# Patient Record
Sex: Female | Born: 1945 | Race: White | Hispanic: No | Marital: Married | Smoking: Never smoker
Health system: Southern US, Community
[De-identification: ages and names within clinical notes are randomized; demographics above are authoritative.]

## PROBLEM LIST (undated history)

## (undated) DIAGNOSIS — C801 Malignant (primary) neoplasm, unspecified: Secondary | ICD-10-CM

## (undated) DIAGNOSIS — J189 Pneumonia, unspecified organism: Secondary | ICD-10-CM

## (undated) DIAGNOSIS — R112 Nausea with vomiting, unspecified: Secondary | ICD-10-CM

## (undated) DIAGNOSIS — K449 Diaphragmatic hernia without obstruction or gangrene: Secondary | ICD-10-CM

## (undated) DIAGNOSIS — Z86010 Personal history of colonic polyps: Secondary | ICD-10-CM

## (undated) DIAGNOSIS — E785 Hyperlipidemia, unspecified: Secondary | ICD-10-CM

## (undated) DIAGNOSIS — E039 Hypothyroidism, unspecified: Secondary | ICD-10-CM

## (undated) DIAGNOSIS — I10 Essential (primary) hypertension: Secondary | ICD-10-CM

## (undated) DIAGNOSIS — T4145XA Adverse effect of unspecified anesthetic, initial encounter: Secondary | ICD-10-CM

## (undated) DIAGNOSIS — Z9889 Other specified postprocedural states: Secondary | ICD-10-CM

## (undated) DIAGNOSIS — T8859XA Other complications of anesthesia, initial encounter: Secondary | ICD-10-CM

## (undated) HISTORY — DX: Hyperlipidemia, unspecified: E78.5

## (undated) HISTORY — DX: Malignant (primary) neoplasm, unspecified: C80.1

## (undated) HISTORY — PX: ABDOMINAL HYSTERECTOMY: SHX81

## (undated) HISTORY — DX: Hypothyroidism, unspecified: E03.9

## (undated) HISTORY — DX: Personal history of colonic polyps: Z86.010

## (undated) HISTORY — DX: Essential (primary) hypertension: I10

## (undated) HISTORY — PX: OTHER SURGICAL HISTORY: SHX169

## (undated) HISTORY — DX: Pneumonia, unspecified organism: J18.9

## (undated) HISTORY — DX: Diaphragmatic hernia without obstruction or gangrene: K44.9

---

## 1998-07-02 ENCOUNTER — Ambulatory Visit (HOSPITAL_BASED_OUTPATIENT_CLINIC_OR_DEPARTMENT_OTHER): Admission: RE | Admit: 1998-07-02 | Discharge: 1998-07-02 | Payer: Self-pay | Admitting: *Deleted

## 1999-06-10 ENCOUNTER — Encounter: Payer: Self-pay | Admitting: Family Medicine

## 1999-06-10 ENCOUNTER — Encounter: Admission: RE | Admit: 1999-06-10 | Discharge: 1999-06-10 | Payer: Self-pay | Admitting: Family Medicine

## 1999-10-27 ENCOUNTER — Other Ambulatory Visit: Admission: RE | Admit: 1999-10-27 | Discharge: 1999-10-27 | Payer: Self-pay | Admitting: Obstetrics and Gynecology

## 2000-10-27 ENCOUNTER — Other Ambulatory Visit: Admission: RE | Admit: 2000-10-27 | Discharge: 2000-10-27 | Payer: Self-pay | Admitting: Obstetrics and Gynecology

## 2001-11-01 ENCOUNTER — Encounter: Payer: Self-pay | Admitting: Family Medicine

## 2001-11-01 ENCOUNTER — Encounter: Admission: RE | Admit: 2001-11-01 | Discharge: 2001-11-01 | Payer: Self-pay | Admitting: Family Medicine

## 2001-12-05 ENCOUNTER — Other Ambulatory Visit: Admission: RE | Admit: 2001-12-05 | Discharge: 2001-12-05 | Payer: Self-pay | Admitting: Obstetrics and Gynecology

## 2004-12-28 ENCOUNTER — Encounter: Admission: RE | Admit: 2004-12-28 | Discharge: 2004-12-28 | Payer: Self-pay | Admitting: Family Medicine

## 2006-03-16 ENCOUNTER — Encounter: Admission: RE | Admit: 2006-03-16 | Discharge: 2006-03-16 | Payer: Self-pay | Admitting: Family Medicine

## 2006-03-24 ENCOUNTER — Encounter: Admission: RE | Admit: 2006-03-24 | Discharge: 2006-03-24 | Payer: Self-pay | Admitting: Family Medicine

## 2006-06-20 LAB — HM MAMMOGRAPHY: HM Mammogram: NORMAL

## 2007-03-15 ENCOUNTER — Ambulatory Visit (HOSPITAL_BASED_OUTPATIENT_CLINIC_OR_DEPARTMENT_OTHER): Admission: RE | Admit: 2007-03-15 | Discharge: 2007-03-15 | Payer: Self-pay | Admitting: Otolaryngology

## 2007-03-18 ENCOUNTER — Ambulatory Visit: Payer: Self-pay | Admitting: Internal Medicine

## 2007-06-25 ENCOUNTER — Encounter: Admission: RE | Admit: 2007-06-25 | Discharge: 2007-06-25 | Payer: Self-pay | Admitting: Cardiology

## 2008-05-05 ENCOUNTER — Ambulatory Visit: Payer: Self-pay | Admitting: Oncology

## 2008-05-08 LAB — CBC WITH DIFFERENTIAL/PLATELET
Basophils Absolute: 0 10*3/uL (ref 0.0–0.1)
EOS%: 1.3 % (ref 0.0–7.0)
Eosinophils Absolute: 0.1 10*3/uL (ref 0.0–0.5)
HGB: 14.9 g/dL (ref 11.6–15.9)
NEUT#: 4 10*3/uL (ref 1.5–6.5)
RDW: 12.9 % (ref 11.3–14.5)
lymph#: 1.9 10*3/uL (ref 0.9–3.3)

## 2008-05-12 ENCOUNTER — Ambulatory Visit (HOSPITAL_COMMUNITY): Admission: RE | Admit: 2008-05-12 | Discharge: 2008-05-12 | Payer: Self-pay | Admitting: Oncology

## 2008-05-12 LAB — SPEP & IFE WITH QIG
Alpha-1-Globulin: 4 % (ref 2.9–4.9)
Alpha-2-Globulin: 9.5 % (ref 7.1–11.8)
Beta Globulin: 6 % (ref 4.7–7.2)
IgG (Immunoglobin G), Serum: 866 mg/dL (ref 694–1618)
Total Protein, Serum Electrophoresis: 7.2 g/dL (ref 6.0–8.3)

## 2008-05-12 LAB — COMPREHENSIVE METABOLIC PANEL
AST: 20 U/L (ref 0–37)
Albumin: 4.6 g/dL (ref 3.5–5.2)
BUN: 16 mg/dL (ref 6–23)
Calcium: 9.6 mg/dL (ref 8.4–10.5)
Chloride: 104 mEq/L (ref 96–112)
Glucose, Bld: 89 mg/dL (ref 70–99)
Potassium: 3.9 mEq/L (ref 3.5–5.3)
Sodium: 142 mEq/L (ref 135–145)
Total Protein: 7.2 g/dL (ref 6.0–8.3)

## 2008-05-26 ENCOUNTER — Encounter: Payer: Self-pay | Admitting: Oncology

## 2008-05-26 ENCOUNTER — Other Ambulatory Visit: Admission: RE | Admit: 2008-05-26 | Discharge: 2008-05-26 | Payer: Self-pay | Admitting: Oncology

## 2008-05-27 ENCOUNTER — Ambulatory Visit (HOSPITAL_COMMUNITY): Admission: RE | Admit: 2008-05-27 | Discharge: 2008-05-27 | Payer: Self-pay | Admitting: Oncology

## 2008-05-27 LAB — HEPATITIS B SURFACE ANTIGEN: Hepatitis B Surface Ag: NEGATIVE

## 2008-06-10 LAB — CBC WITH DIFFERENTIAL/PLATELET
BASO%: 1.8 % (ref 0.0–2.0)
HCT: 38.9 % (ref 34.8–46.6)
LYMPH%: 55.1 % — ABNORMAL HIGH (ref 14.0–48.0)
MCH: 32.3 pg (ref 26.0–34.0)
MCHC: 35.3 g/dL (ref 32.0–36.0)
MCV: 91.7 fL (ref 81.0–101.0)
MONO#: 0.3 10*3/uL (ref 0.1–0.9)
MONO%: 21.9 % — ABNORMAL HIGH (ref 0.0–13.0)
NEUT%: 18.1 % — ABNORMAL LOW (ref 39.6–76.8)
Platelets: 162 10*3/uL (ref 145–400)

## 2008-06-10 LAB — COMPREHENSIVE METABOLIC PANEL
ALT: 17 U/L (ref 0–35)
Alkaline Phosphatase: 51 U/L (ref 39–117)
CO2: 29 mEq/L (ref 19–32)
Creatinine, Ser: 0.7 mg/dL (ref 0.40–1.20)
Total Bilirubin: 0.6 mg/dL (ref 0.3–1.2)

## 2008-06-10 LAB — LACTATE DEHYDROGENASE: LDH: 157 U/L (ref 94–250)

## 2008-06-12 LAB — CBC WITH DIFFERENTIAL/PLATELET
Basophils Absolute: 0 10*3/uL (ref 0.0–0.1)
HCT: 37.6 % (ref 34.8–46.6)
HGB: 13.1 g/dL (ref 11.6–15.9)
MCH: 31.9 pg (ref 26.0–34.0)
MONO#: 0.4 10*3/uL (ref 0.1–0.9)
NEUT%: 3.3 % — ABNORMAL LOW (ref 39.6–76.8)
WBC: 1.1 10*3/uL — ABNORMAL LOW (ref 3.9–10.0)
lymph#: 0.6 10*3/uL — ABNORMAL LOW (ref 0.9–3.3)

## 2008-06-16 ENCOUNTER — Ambulatory Visit: Payer: Self-pay | Admitting: Oncology

## 2008-06-16 LAB — CBC WITH DIFFERENTIAL/PLATELET
Basophils Absolute: 0 10*3/uL (ref 0.0–0.1)
EOS%: 0.6 % (ref 0.0–7.0)
HCT: 38.6 % (ref 34.8–46.6)
HGB: 13.7 g/dL (ref 11.6–15.9)
MCH: 32.3 pg (ref 26.0–34.0)
MCV: 91 fL (ref 81.0–101.0)
MONO%: 36.7 % — ABNORMAL HIGH (ref 0.0–13.0)
NEUT%: 19.4 % — ABNORMAL LOW (ref 39.6–76.8)

## 2008-06-23 LAB — CBC WITH DIFFERENTIAL/PLATELET
BASO%: 0.3 % (ref 0.0–2.0)
Basophils Absolute: 0 10*3/uL (ref 0.0–0.1)
EOS%: 0.9 % (ref 0.0–7.0)
HCT: 40.2 % (ref 34.8–46.6)
HGB: 14 g/dL (ref 11.6–15.9)
MCH: 31.8 pg (ref 26.0–34.0)
MCHC: 34.9 g/dL (ref 32.0–36.0)
MCV: 91.1 fL (ref 81.0–101.0)
MONO%: 7.9 % (ref 0.0–13.0)
NEUT%: 59.8 % (ref 39.6–76.8)
lymph#: 1.4 10*3/uL (ref 0.9–3.3)

## 2008-06-27 LAB — CBC WITH DIFFERENTIAL/PLATELET
BASO%: 0 % (ref 0.0–2.0)
EOS%: 0 % (ref 0.0–7.0)
MCH: 32.4 pg (ref 26.0–34.0)
MCHC: 35 g/dL (ref 32.0–36.0)
MCV: 92.6 fL (ref 81.0–101.0)
MONO%: 0.2 % (ref 0.0–13.0)
RBC: 4.05 10*6/uL (ref 3.70–5.32)
RDW: 13 % (ref 11.3–14.5)
lymph#: 0.2 10*3/uL — ABNORMAL LOW (ref 0.9–3.3)

## 2008-07-08 ENCOUNTER — Ambulatory Visit (HOSPITAL_COMMUNITY): Admission: RE | Admit: 2008-07-08 | Discharge: 2008-07-08 | Payer: Self-pay | Admitting: Oncology

## 2008-07-16 LAB — CBC WITH DIFFERENTIAL/PLATELET
Eosinophils Absolute: 0 10*3/uL (ref 0.0–0.5)
HGB: 12.7 g/dL (ref 11.6–15.9)
MCV: 93.1 fL (ref 81.0–101.0)
MONO#: 0.5 10*3/uL (ref 0.1–0.9)
MONO%: 13.3 % — ABNORMAL HIGH (ref 0.0–13.0)
NEUT#: 2.8 10*3/uL (ref 1.5–6.5)
RBC: 3.88 10*6/uL (ref 3.70–5.32)
RDW: 14.6 % — ABNORMAL HIGH (ref 11.3–14.5)
WBC: 4 10*3/uL (ref 3.9–10.0)
lymph#: 0.7 10*3/uL — ABNORMAL LOW (ref 0.9–3.3)

## 2008-08-01 ENCOUNTER — Ambulatory Visit: Payer: Self-pay | Admitting: Oncology

## 2008-08-05 LAB — CBC WITH DIFFERENTIAL/PLATELET
Basophils Absolute: 0 10*3/uL (ref 0.0–0.1)
Eosinophils Absolute: 0 10*3/uL (ref 0.0–0.5)
HCT: 33.9 % — ABNORMAL LOW (ref 34.8–46.6)
HGB: 11.8 g/dL (ref 11.6–15.9)
MCV: 95.7 fL (ref 81.0–101.0)
NEUT#: 3.6 10*3/uL (ref 1.5–6.5)
NEUT%: 68.2 % (ref 39.6–76.8)
RDW: 19.3 % — ABNORMAL HIGH (ref 11.3–14.5)
lymph#: 0.6 10*3/uL — ABNORMAL LOW (ref 0.9–3.3)

## 2008-08-05 LAB — COMPREHENSIVE METABOLIC PANEL
Albumin: 4.2 g/dL (ref 3.5–5.2)
BUN: 13 mg/dL (ref 6–23)
Calcium: 9.5 mg/dL (ref 8.4–10.5)
Chloride: 105 mEq/L (ref 96–112)
Glucose, Bld: 110 mg/dL — ABNORMAL HIGH (ref 70–99)
Potassium: 3.7 mEq/L (ref 3.5–5.3)

## 2008-08-05 LAB — LACTATE DEHYDROGENASE: LDH: 255 U/L — ABNORMAL HIGH (ref 94–250)

## 2008-08-26 LAB — CBC WITH DIFFERENTIAL/PLATELET
Basophils Absolute: 0 10*3/uL (ref 0.0–0.1)
EOS%: 0.1 % (ref 0.0–7.0)
Eosinophils Absolute: 0 10*3/uL (ref 0.0–0.5)
HGB: 10.5 g/dL — ABNORMAL LOW (ref 11.6–15.9)
LYMPH%: 12 % — ABNORMAL LOW (ref 14.0–49.7)
MCH: 34.5 pg — ABNORMAL HIGH (ref 25.1–34.0)
MCV: 98 fL (ref 79.5–101.0)
MONO%: 17.6 % — ABNORMAL HIGH (ref 0.0–14.0)
NEUT#: 3.6 10*3/uL (ref 1.5–6.5)
Platelets: 337 10*3/uL (ref 145–400)
RDW: 18.9 % — ABNORMAL HIGH (ref 11.2–14.5)

## 2008-08-26 LAB — COMPREHENSIVE METABOLIC PANEL
AST: 14 U/L (ref 0–37)
Alkaline Phosphatase: 81 U/L (ref 39–117)
BUN: 12 mg/dL (ref 6–23)
Creatinine, Ser: 0.76 mg/dL (ref 0.40–1.20)
Glucose, Bld: 128 mg/dL — ABNORMAL HIGH (ref 70–99)
Potassium: 3.7 mEq/L (ref 3.5–5.3)
Total Bilirubin: 0.7 mg/dL (ref 0.3–1.2)

## 2008-09-09 ENCOUNTER — Ambulatory Visit (HOSPITAL_COMMUNITY): Admission: RE | Admit: 2008-09-09 | Discharge: 2008-09-09 | Payer: Self-pay | Admitting: Oncology

## 2008-09-12 ENCOUNTER — Ambulatory Visit: Payer: Self-pay | Admitting: Oncology

## 2008-09-16 LAB — CBC WITH DIFFERENTIAL/PLATELET
Basophils Absolute: 0 10*3/uL (ref 0.0–0.1)
EOS%: 0.1 % (ref 0.0–7.0)
Eosinophils Absolute: 0 10*3/uL (ref 0.0–0.5)
HCT: 31.1 % — ABNORMAL LOW (ref 34.8–46.6)
HGB: 10.7 g/dL — ABNORMAL LOW (ref 11.6–15.9)
MCH: 34.7 pg — ABNORMAL HIGH (ref 25.1–34.0)
MCV: 100.3 fL (ref 79.5–101.0)
MONO%: 12.7 % (ref 0.0–14.0)
NEUT#: 5.3 10*3/uL (ref 1.5–6.5)
NEUT%: 80.1 % — ABNORMAL HIGH (ref 38.4–76.8)
Platelets: 289 10*3/uL (ref 145–400)
RDW: 18.6 % — ABNORMAL HIGH (ref 11.2–14.5)

## 2008-09-16 LAB — COMPREHENSIVE METABOLIC PANEL
Albumin: 4.3 g/dL (ref 3.5–5.2)
Alkaline Phosphatase: 70 U/L (ref 39–117)
BUN: 14 mg/dL (ref 6–23)
Calcium: 9.7 mg/dL (ref 8.4–10.5)
Creatinine, Ser: 0.71 mg/dL (ref 0.40–1.20)
Glucose, Bld: 124 mg/dL — ABNORMAL HIGH (ref 70–99)
Potassium: 3.8 mEq/L (ref 3.5–5.3)

## 2008-10-14 LAB — CBC WITH DIFFERENTIAL/PLATELET
Basophils Absolute: 0 10*3/uL (ref 0.0–0.1)
Eosinophils Absolute: 0 10*3/uL (ref 0.0–0.5)
HCT: 34.9 % (ref 34.8–46.6)
HGB: 11.9 g/dL (ref 11.6–15.9)
MCH: 33.9 pg (ref 25.1–34.0)
MCV: 99.4 fL (ref 79.5–101.0)
MONO%: 34.6 % — ABNORMAL HIGH (ref 0.0–14.0)
NEUT#: 1.3 10*3/uL — ABNORMAL LOW (ref 1.5–6.5)
NEUT%: 45.3 % (ref 38.4–76.8)
RDW: 16.2 % — ABNORMAL HIGH (ref 11.2–14.5)

## 2008-11-21 ENCOUNTER — Ambulatory Visit: Payer: Self-pay | Admitting: Oncology

## 2008-11-25 LAB — COMPREHENSIVE METABOLIC PANEL
ALT: 26 U/L (ref 0–35)
AST: 25 U/L (ref 0–37)
Alkaline Phosphatase: 74 U/L (ref 39–117)
BUN: 16 mg/dL (ref 6–23)
Calcium: 10.2 mg/dL (ref 8.4–10.5)
Chloride: 105 mEq/L (ref 96–112)
Creatinine, Ser: 0.73 mg/dL (ref 0.40–1.20)
Total Bilirubin: 0.9 mg/dL (ref 0.3–1.2)

## 2008-11-25 LAB — CBC WITH DIFFERENTIAL/PLATELET
BASO%: 0.3 % (ref 0.0–2.0)
Basophils Absolute: 0 10*3/uL (ref 0.0–0.1)
EOS%: 4.8 % (ref 0.0–7.0)
HCT: 40.3 % (ref 34.8–46.6)
HGB: 14.2 g/dL (ref 11.6–15.9)
LYMPH%: 28.4 % (ref 14.0–49.7)
MCH: 33.6 pg (ref 25.1–34.0)
MCHC: 35.2 g/dL (ref 31.5–36.0)
MCV: 95.3 fL (ref 79.5–101.0)
MONO%: 13 % (ref 0.0–14.0)
NEUT%: 53.5 % (ref 38.4–76.8)
Platelets: 170 10*3/uL (ref 145–400)

## 2009-02-12 ENCOUNTER — Ambulatory Visit: Payer: Self-pay | Admitting: Oncology

## 2009-02-16 LAB — CBC WITH DIFFERENTIAL/PLATELET
BASO%: 0.3 % (ref 0.0–2.0)
Basophils Absolute: 0 10*3/uL (ref 0.0–0.1)
EOS%: 5.8 % (ref 0.0–7.0)
LYMPH%: 27.7 % (ref 14.0–49.7)
MCV: 91.5 fL (ref 79.5–101.0)
NEUT#: 1.4 10*3/uL — ABNORMAL LOW (ref 1.5–6.5)
NEUT%: 46.7 % (ref 38.4–76.8)
RBC: 4.45 10*6/uL (ref 3.70–5.45)
RDW: 12.5 % (ref 11.2–14.5)
WBC: 2.9 10*3/uL — ABNORMAL LOW (ref 3.9–10.3)
lymph#: 0.8 10*3/uL — ABNORMAL LOW (ref 0.9–3.3)

## 2009-02-16 LAB — COMPREHENSIVE METABOLIC PANEL
ALT: 30 U/L (ref 0–35)
BUN: 17 mg/dL (ref 6–23)
CO2: 27 mEq/L (ref 19–32)
Calcium: 10 mg/dL (ref 8.4–10.5)
Creatinine, Ser: 0.7 mg/dL (ref 0.40–1.20)
Total Bilirubin: 1.1 mg/dL (ref 0.3–1.2)

## 2009-02-16 LAB — LACTATE DEHYDROGENASE: LDH: 246 U/L (ref 94–250)

## 2009-04-06 ENCOUNTER — Emergency Department (HOSPITAL_COMMUNITY): Admission: EM | Admit: 2009-04-06 | Discharge: 2009-04-07 | Payer: Self-pay | Admitting: Emergency Medicine

## 2009-04-13 ENCOUNTER — Ambulatory Visit: Payer: Self-pay | Admitting: Oncology

## 2009-05-08 ENCOUNTER — Ambulatory Visit (HOSPITAL_COMMUNITY): Admission: RE | Admit: 2009-05-08 | Discharge: 2009-05-08 | Payer: Self-pay | Admitting: Oncology

## 2009-05-08 LAB — CBC WITH DIFFERENTIAL/PLATELET
BASO%: 0.3 % (ref 0.0–2.0)
EOS%: 9.5 % — ABNORMAL HIGH (ref 0.0–7.0)
MCH: 33.2 pg (ref 25.1–34.0)
MCHC: 34.6 g/dL (ref 31.5–36.0)
MCV: 96 fL (ref 79.5–101.0)
MONO%: 9.9 % (ref 0.0–14.0)
RBC: 4.34 10*6/uL (ref 3.70–5.45)
RDW: 12.9 % (ref 11.2–14.5)
lymph#: 0.9 10*3/uL (ref 0.9–3.3)

## 2009-05-13 ENCOUNTER — Ambulatory Visit: Payer: Self-pay | Admitting: Oncology

## 2009-05-20 ENCOUNTER — Ambulatory Visit (HOSPITAL_COMMUNITY): Admission: RE | Admit: 2009-05-20 | Discharge: 2009-05-20 | Payer: Self-pay | Admitting: Oncology

## 2009-06-09 ENCOUNTER — Ambulatory Visit (HOSPITAL_COMMUNITY): Admission: RE | Admit: 2009-06-09 | Discharge: 2009-06-09 | Payer: Self-pay | Admitting: Oncology

## 2009-06-10 ENCOUNTER — Ambulatory Visit: Payer: Self-pay | Admitting: Oncology

## 2009-06-16 LAB — COMPREHENSIVE METABOLIC PANEL
ALT: 29 U/L (ref 0–35)
AST: 28 U/L (ref 0–37)
Albumin: 4.3 g/dL (ref 3.5–5.2)
CO2: 31 mEq/L (ref 19–32)
Calcium: 9.6 mg/dL (ref 8.4–10.5)
Chloride: 106 mEq/L (ref 96–112)
Potassium: 3.5 mEq/L (ref 3.5–5.3)

## 2009-06-16 LAB — CBC WITH DIFFERENTIAL/PLATELET
BASO%: 0.1 % (ref 0.0–2.0)
Basophils Absolute: 0 10*3/uL (ref 0.0–0.1)
EOS%: 6.5 % (ref 0.0–7.0)
HCT: 42.9 % (ref 34.8–46.6)
HGB: 14.8 g/dL (ref 11.6–15.9)
MCH: 33.1 pg (ref 25.1–34.0)
MCHC: 34.5 g/dL (ref 31.5–36.0)
MONO#: 0.5 10*3/uL (ref 0.1–0.9)
NEUT%: 59.9 % (ref 38.4–76.8)
RDW: 12.5 % (ref 11.2–14.5)
WBC: 5 10*3/uL (ref 3.9–10.3)
lymph#: 1.1 10*3/uL (ref 0.9–3.3)

## 2009-06-16 LAB — LACTATE DEHYDROGENASE: LDH: 213 U/L (ref 94–250)

## 2009-07-01 LAB — CBC WITH DIFFERENTIAL/PLATELET
Basophils Absolute: 0 10*3/uL (ref 0.0–0.1)
MCHC: 34.4 g/dL (ref 31.5–36.0)
MCV: 95.8 fL (ref 79.5–101.0)
NEUT#: 3.8 10*3/uL (ref 1.5–6.5)
NEUT%: 65.3 % (ref 38.4–76.8)
lymph#: 0.9 10*3/uL (ref 0.9–3.3)

## 2009-07-10 ENCOUNTER — Ambulatory Visit: Payer: Self-pay | Admitting: Oncology

## 2009-07-10 LAB — CBC WITH DIFFERENTIAL/PLATELET
Basophils Absolute: 0 10*3/uL (ref 0.0–0.1)
EOS%: 6.4 % (ref 0.0–7.0)
Eosinophils Absolute: 0.3 10*3/uL (ref 0.0–0.5)
HGB: 14.5 g/dL (ref 11.6–15.9)
LYMPH%: 13.5 % — ABNORMAL LOW (ref 14.0–49.7)
MCHC: 35 g/dL (ref 31.5–36.0)
MONO%: 12.5 % (ref 0.0–14.0)
NEUT#: 3.3 10*3/uL (ref 1.5–6.5)
Platelets: 166 10*3/uL (ref 145–400)
RDW: 12.4 % (ref 11.2–14.5)
WBC: 4.9 10*3/uL (ref 3.9–10.3)

## 2009-07-10 LAB — COMPREHENSIVE METABOLIC PANEL
AST: 26 U/L (ref 0–37)
BUN: 18 mg/dL (ref 6–23)
CO2: 30 mEq/L (ref 19–32)
Calcium: 9.4 mg/dL (ref 8.4–10.5)
Chloride: 106 mEq/L (ref 96–112)
Creatinine, Ser: 0.76 mg/dL (ref 0.40–1.20)
Potassium: 3.4 mEq/L — ABNORMAL LOW (ref 3.5–5.3)
Sodium: 143 mEq/L (ref 135–145)
Total Bilirubin: 0.8 mg/dL (ref 0.3–1.2)
Total Protein: 6.9 g/dL (ref 6.0–8.3)

## 2009-08-05 LAB — CBC WITH DIFFERENTIAL/PLATELET
BASO%: 0.5 % (ref 0.0–2.0)
Basophils Absolute: 0 10*3/uL (ref 0.0–0.1)
EOS%: 8.7 % — ABNORMAL HIGH (ref 0.0–7.0)
HGB: 13.5 g/dL (ref 11.6–15.9)
MCH: 32.4 pg (ref 25.1–34.0)
MCHC: 34.3 g/dL (ref 31.5–36.0)
MCV: 94.5 fL (ref 79.5–101.0)
MONO%: 14.2 % — ABNORMAL HIGH (ref 0.0–14.0)
RDW: 13.3 % (ref 11.2–14.5)

## 2009-08-05 LAB — COMPREHENSIVE METABOLIC PANEL
Alkaline Phosphatase: 68 U/L (ref 39–117)
BUN: 18 mg/dL (ref 6–23)
Glucose, Bld: 90 mg/dL (ref 70–99)
Sodium: 143 mEq/L (ref 135–145)
Total Bilirubin: 0.7 mg/dL (ref 0.3–1.2)
Total Protein: 6.4 g/dL (ref 6.0–8.3)

## 2009-08-12 ENCOUNTER — Ambulatory Visit: Payer: Self-pay | Admitting: Oncology

## 2009-09-08 ENCOUNTER — Ambulatory Visit (HOSPITAL_COMMUNITY): Admission: RE | Admit: 2009-09-08 | Discharge: 2009-09-08 | Payer: Self-pay | Admitting: Oncology

## 2009-09-08 LAB — CBC WITH DIFFERENTIAL/PLATELET
Basophils Absolute: 0 10*3/uL (ref 0.0–0.1)
EOS%: 4.5 % (ref 0.0–7.0)
HCT: 37.8 % (ref 34.8–46.6)
HGB: 13.1 g/dL (ref 11.6–15.9)
LYMPH%: 18.9 % (ref 14.0–49.7)
MCH: 32.9 pg (ref 25.1–34.0)
MCHC: 34.7 g/dL (ref 31.5–36.0)
MCV: 95 fL (ref 79.5–101.0)
MONO%: 15.4 % — ABNORMAL HIGH (ref 0.0–14.0)
NEUT%: 60.7 % (ref 38.4–76.8)
Platelets: 159 10*3/uL (ref 145–400)

## 2009-09-18 ENCOUNTER — Ambulatory Visit: Payer: Self-pay | Admitting: Oncology

## 2009-09-23 LAB — CBC WITH DIFFERENTIAL/PLATELET
Basophils Absolute: 0 10*3/uL (ref 0.0–0.1)
Eosinophils Absolute: 0.1 10*3/uL (ref 0.0–0.5)
HGB: 12.3 g/dL (ref 11.6–15.9)
MCV: 96.9 fL (ref 79.5–101.0)
MONO#: 0.1 10*3/uL (ref 0.1–0.9)
MONO%: 0.9 % (ref 0.0–14.0)
NEUT#: 5.5 10*3/uL (ref 1.5–6.5)
RDW: 13.2 % (ref 11.2–14.5)
lymph#: 0.1 10*3/uL — ABNORMAL LOW (ref 0.9–3.3)

## 2009-09-23 LAB — COMPREHENSIVE METABOLIC PANEL
Albumin: 4.2 g/dL (ref 3.5–5.2)
BUN: 17 mg/dL (ref 6–23)
CO2: 22 mEq/L (ref 19–32)
Calcium: 9 mg/dL (ref 8.4–10.5)
Chloride: 103 mEq/L (ref 96–112)
Glucose, Bld: 147 mg/dL — ABNORMAL HIGH (ref 70–99)
Potassium: 3.2 mEq/L — ABNORMAL LOW (ref 3.5–5.3)

## 2009-09-25 ENCOUNTER — Encounter (HOSPITAL_COMMUNITY): Admission: RE | Admit: 2009-09-25 | Discharge: 2009-12-24 | Payer: Self-pay | Admitting: Oncology

## 2009-09-25 LAB — CBC WITH DIFFERENTIAL/PLATELET
HCT: 28.8 % — ABNORMAL LOW (ref 34.8–46.6)
HGB: 10.2 g/dL — ABNORMAL LOW (ref 11.6–15.9)
MCH: 32.8 pg (ref 25.1–34.0)
MCHC: 35.4 g/dL (ref 31.5–36.0)
MCV: 92.6 fL (ref 79.5–101.0)
Platelets: 29 10*3/uL — ABNORMAL LOW (ref 145–400)
RBC: 3.11 10*6/uL — ABNORMAL LOW (ref 3.70–5.45)
RDW: 12.5 % (ref 11.2–14.5)
WBC: <0.2 10*3/uL — CL (ref 3.9–10.3)

## 2009-09-25 LAB — COMPREHENSIVE METABOLIC PANEL
AST: 19 U/L (ref 0–37)
Albumin: 4 g/dL (ref 3.5–5.2)
Alkaline Phosphatase: 72 U/L (ref 39–117)
Potassium: 3.5 mEq/L (ref 3.5–5.3)
Sodium: 140 mEq/L (ref 135–145)
Total Protein: 5.9 g/dL — ABNORMAL LOW (ref 6.0–8.3)

## 2009-09-25 LAB — TECHNOLOGIST REVIEW

## 2009-09-28 LAB — COMPREHENSIVE METABOLIC PANEL
Albumin: 3.6 g/dL (ref 3.5–5.2)
Alkaline Phosphatase: 79 U/L (ref 39–117)
BUN: 14 mg/dL (ref 6–23)
Glucose, Bld: 163 mg/dL — ABNORMAL HIGH (ref 70–99)
Total Bilirubin: 2.2 mg/dL — ABNORMAL HIGH (ref 0.3–1.2)

## 2009-09-28 LAB — CBC WITH DIFFERENTIAL/PLATELET
HCT: 22.6 % — ABNORMAL LOW (ref 34.8–46.6)
MCH: 32 pg (ref 25.1–34.0)
MCHC: 35.8 g/dL (ref 31.5–36.0)
MCV: 89.3 fL (ref 79.5–101.0)
Platelets: <6 10*3/uL — CL (ref 145–400)

## 2009-09-28 LAB — PLATELET COUNT: Platelets: 21 10*3/uL — ABNORMAL LOW (ref 145–400)

## 2009-09-29 LAB — CBC WITH DIFFERENTIAL/PLATELET
HCT: 25.7 % — ABNORMAL LOW (ref 34.8–46.6)
Platelets: 15 10*3/uL — ABNORMAL LOW (ref 145–400)
RBC: 2.98 10*6/uL — ABNORMAL LOW (ref 3.70–5.45)
WBC: <0.2 10*3/uL — CL (ref 3.9–10.3)

## 2009-09-30 LAB — CBC WITH DIFFERENTIAL/PLATELET
Basophils Absolute: 0 10*3/uL (ref 0.0–0.1)
Eosinophils Absolute: 0 10*3/uL (ref 0.0–0.5)
HCT: 25.7 % — ABNORMAL LOW (ref 34.8–46.6)
LYMPH%: 10.7 % — ABNORMAL LOW (ref 14.0–49.7)
MONO#: 0.1 10*3/uL (ref 0.1–0.9)
NEUT#: 0.6 10*3/uL — ABNORMAL LOW (ref 1.5–6.5)
NEUT%: 71.4 % (ref 38.4–76.8)
Platelets: 12 10*3/uL — ABNORMAL LOW (ref 145–400)
WBC: 0.8 10*3/uL — CL (ref 3.9–10.3)

## 2009-09-30 LAB — COMPREHENSIVE METABOLIC PANEL
ALT: 22 U/L (ref 0–35)
BUN: 14 mg/dL (ref 6–23)
CO2: 25 mEq/L (ref 19–32)
Calcium: 8.9 mg/dL (ref 8.4–10.5)
Chloride: 103 mEq/L (ref 96–112)
Creatinine, Ser: 0.57 mg/dL (ref 0.40–1.20)
Glucose, Bld: 112 mg/dL — ABNORMAL HIGH (ref 70–99)

## 2009-10-01 LAB — CBC WITH DIFFERENTIAL/PLATELET
BASO%: 0.3 % (ref 0.0–2.0)
HCT: 25.4 % — ABNORMAL LOW (ref 34.8–46.6)
HGB: 9 g/dL — ABNORMAL LOW (ref 11.6–15.9)
MCHC: 35.4 g/dL (ref 31.5–36.0)
MONO#: 0.4 10*3/uL (ref 0.1–0.9)
NEUT%: 78.6 % — ABNORMAL HIGH (ref 38.4–76.8)
WBC: 3.2 10*3/uL — ABNORMAL LOW (ref 3.9–10.3)
lymph#: 0.3 10*3/uL — ABNORMAL LOW (ref 0.9–3.3)

## 2009-10-14 LAB — CBC WITH DIFFERENTIAL/PLATELET
BASO%: 2.7 % — ABNORMAL HIGH (ref 0.0–2.0)
Basophils Absolute: 0.1 10*3/uL (ref 0.0–0.1)
EOS%: 1.2 % (ref 0.0–7.0)
MCH: 30.5 pg (ref 25.1–34.0)
MCHC: 33.2 g/dL (ref 31.5–36.0)
MONO%: 19 % — ABNORMAL HIGH (ref 0.0–14.0)
NEUT#: 1.7 10*3/uL (ref 1.5–6.5)
NEUT%: 52.3 % (ref 38.4–76.8)
Platelets: 181 10*3/uL (ref 145–400)
WBC: 3.3 10*3/uL — ABNORMAL LOW (ref 3.9–10.3)

## 2009-11-11 ENCOUNTER — Ambulatory Visit: Payer: Self-pay | Admitting: Oncology

## 2009-11-12 LAB — CBC WITH DIFFERENTIAL/PLATELET
Basophils Absolute: 0 10*3/uL (ref 0.0–0.1)
EOS%: 0 % (ref 0.0–7.0)
MCH: 29.9 pg (ref 25.1–34.0)
MCHC: 33.7 g/dL (ref 31.5–36.0)
MCV: 88.7 fL (ref 79.5–101.0)
MONO%: 24.4 % — ABNORMAL HIGH (ref 0.0–14.0)
RBC: 3.35 10*6/uL — ABNORMAL LOW (ref 3.70–5.45)
RDW: 16.1 % — ABNORMAL HIGH (ref 11.2–14.5)
nRBC: 0 % (ref 0–0)

## 2009-11-12 LAB — COMPREHENSIVE METABOLIC PANEL
ALT: 18 U/L (ref 0–35)
CO2: 25 mEq/L (ref 19–32)
Creatinine, Ser: 1.33 mg/dL — ABNORMAL HIGH (ref 0.40–1.20)
Total Bilirubin: 0.7 mg/dL (ref 0.3–1.2)

## 2009-11-12 LAB — TECHNOLOGIST REVIEW

## 2009-11-12 LAB — PHOSPHORUS: Phosphorus: 3.8 mg/dL (ref 2.3–4.6)

## 2009-11-19 LAB — COMPREHENSIVE METABOLIC PANEL
ALT: 20 U/L (ref 0–35)
AST: 30 U/L (ref 0–37)
Albumin: 3.9 g/dL (ref 3.5–5.2)
Alkaline Phosphatase: 78 U/L (ref 39–117)
Potassium: 4.5 mEq/L (ref 3.5–5.3)
Sodium: 137 mEq/L (ref 135–145)
Total Protein: 6.2 g/dL (ref 6.0–8.3)

## 2009-11-19 LAB — CBC WITH DIFFERENTIAL/PLATELET
BASO%: 0.4 % (ref 0.0–2.0)
Basophils Absolute: 0 10*3/uL (ref 0.0–0.1)
EOS%: 1.1 % (ref 0.0–7.0)
HGB: 9.4 g/dL — ABNORMAL LOW (ref 11.6–15.9)
MCH: 30.4 pg (ref 25.1–34.0)
MCHC: 34.1 g/dL (ref 31.5–36.0)
RBC: 3.09 10*6/uL — ABNORMAL LOW (ref 3.70–5.45)
RDW: 18.1 % — ABNORMAL HIGH (ref 11.2–14.5)
lymph#: 1.7 10*3/uL (ref 0.9–3.3)
nRBC: 0 % (ref 0–0)

## 2009-11-26 LAB — COMPREHENSIVE METABOLIC PANEL
AST: 45 U/L — ABNORMAL HIGH (ref 0–37)
Alkaline Phosphatase: 93 U/L (ref 39–117)
BUN: 12 mg/dL (ref 6–23)
Creatinine, Ser: 0.99 mg/dL (ref 0.40–1.20)
Total Bilirubin: 0.9 mg/dL (ref 0.3–1.2)

## 2009-11-26 LAB — CBC WITH DIFFERENTIAL/PLATELET
Basophils Absolute: 0 10*3/uL (ref 0.0–0.1)
EOS%: 4.3 % (ref 0.0–7.0)
HCT: 23.8 % — ABNORMAL LOW (ref 34.8–46.6)
HGB: 8.5 g/dL — ABNORMAL LOW (ref 11.6–15.9)
LYMPH%: 14 % (ref 14.0–49.7)
MCH: 32.9 pg (ref 25.1–34.0)
MCV: 92.5 fL (ref 79.5–101.0)
MONO%: 12.5 % (ref 0.0–14.0)
NEUT%: 69 % (ref 38.4–76.8)
RDW: 23.9 % — ABNORMAL HIGH (ref 11.2–14.5)

## 2009-12-03 LAB — CBC WITH DIFFERENTIAL/PLATELET
Eosinophils Absolute: 0.4 10*3/uL (ref 0.0–0.5)
HCT: 22.2 % — ABNORMAL LOW (ref 34.8–46.6)
LYMPH%: 15.1 % (ref 14.0–49.7)
MONO#: 0.9 10*3/uL (ref 0.1–0.9)
NEUT#: 5.1 10*3/uL (ref 1.5–6.5)
NEUT%: 68.2 % (ref 38.4–76.8)
Platelets: 132 10*3/uL — ABNORMAL LOW (ref 145–400)
WBC: 7.5 10*3/uL (ref 3.9–10.3)
lymph#: 1.1 10*3/uL (ref 0.9–3.3)

## 2009-12-03 LAB — HOLD TUBE, BLOOD BANK

## 2009-12-04 LAB — PHOSPHORUS: Phosphorus: 3.5 mg/dL (ref 2.3–4.6)

## 2009-12-04 LAB — COMPREHENSIVE METABOLIC PANEL
ALT: 42 U/L — ABNORMAL HIGH (ref 0–35)
Alkaline Phosphatase: 128 U/L — ABNORMAL HIGH (ref 39–117)
BUN: 12 mg/dL (ref 6–23)
Chloride: 99 mEq/L (ref 96–112)
Potassium: 4.1 mEq/L (ref 3.5–5.3)
Sodium: 132 mEq/L — ABNORMAL LOW (ref 135–145)
Total Bilirubin: 0.8 mg/dL (ref 0.3–1.2)

## 2009-12-04 LAB — MAGNESIUM: Magnesium: 2 mg/dL (ref 1.5–2.5)

## 2009-12-11 ENCOUNTER — Ambulatory Visit: Payer: Self-pay | Admitting: Oncology

## 2009-12-11 ENCOUNTER — Ambulatory Visit: Payer: Self-pay | Admitting: Pulmonary Disease

## 2009-12-11 ENCOUNTER — Inpatient Hospital Stay (HOSPITAL_COMMUNITY): Admission: EM | Admit: 2009-12-11 | Discharge: 2009-12-15 | Payer: Self-pay | Admitting: Emergency Medicine

## 2009-12-11 ENCOUNTER — Encounter: Payer: Self-pay | Admitting: Cardiovascular Disease

## 2009-12-15 ENCOUNTER — Ambulatory Visit: Payer: Self-pay | Admitting: Oncology

## 2009-12-17 ENCOUNTER — Ambulatory Visit (HOSPITAL_COMMUNITY): Admission: RE | Admit: 2009-12-17 | Discharge: 2009-12-17 | Payer: Self-pay | Admitting: Oncology

## 2009-12-17 LAB — CBC WITH DIFFERENTIAL/PLATELET
BASO%: 0.3 % (ref 0.0–2.0)
Eosinophils Absolute: 0.1 10*3/uL (ref 0.0–0.5)
HCT: 26.2 % — ABNORMAL LOW (ref 34.8–46.6)
MCHC: 34.9 g/dL (ref 31.5–36.0)
MONO#: 0.8 10*3/uL (ref 0.1–0.9)
NEUT#: 5.5 10*3/uL (ref 1.5–6.5)
NEUT%: 65.4 % (ref 38.4–76.8)
RBC: 2.75 10*6/uL — ABNORMAL LOW (ref 3.70–5.45)
WBC: 8.4 10*3/uL (ref 3.9–10.3)
lymph#: 1.9 10*3/uL (ref 0.9–3.3)

## 2009-12-17 LAB — COMPREHENSIVE METABOLIC PANEL
ALT: 31 U/L (ref 0–35)
CO2: 27 mEq/L (ref 19–32)
Calcium: 9 mg/dL (ref 8.4–10.5)
Chloride: 101 mEq/L (ref 96–112)
Sodium: 135 mEq/L (ref 135–145)
Total Protein: 5.1 g/dL — ABNORMAL LOW (ref 6.0–8.3)

## 2009-12-17 LAB — PHOSPHORUS: Phosphorus: 3.3 mg/dL (ref 2.3–4.6)

## 2009-12-24 ENCOUNTER — Ambulatory Visit (HOSPITAL_COMMUNITY): Admission: RE | Admit: 2009-12-24 | Discharge: 2009-12-24 | Payer: Self-pay | Admitting: Oncology

## 2009-12-24 LAB — CBC WITH DIFFERENTIAL/PLATELET
Eosinophils Absolute: 0.2 10*3/uL (ref 0.0–0.5)
MONO#: 0.9 10*3/uL (ref 0.1–0.9)
NEUT#: 3.4 10*3/uL (ref 1.5–6.5)
RBC: 2.79 10*6/uL — ABNORMAL LOW (ref 3.70–5.45)
RDW: 25.3 % — ABNORMAL HIGH (ref 11.2–14.5)
WBC: 8 10*3/uL (ref 3.9–10.3)

## 2010-01-07 LAB — CBC WITH DIFFERENTIAL/PLATELET
Eosinophils Absolute: 0.1 10*3/uL (ref 0.0–0.5)
HCT: 29.2 % — ABNORMAL LOW (ref 34.8–46.6)
HGB: 10.3 g/dL — ABNORMAL LOW (ref 11.6–15.9)
LYMPH%: 53.4 % — ABNORMAL HIGH (ref 14.0–49.7)
MONO#: 0.8 10*3/uL (ref 0.1–0.9)
NEUT#: 2.5 10*3/uL (ref 1.5–6.5)
NEUT%: 33.5 % — ABNORMAL LOW (ref 38.4–76.8)
Platelets: 74 10*3/uL — ABNORMAL LOW (ref 145–400)
RBC: 2.8 10*6/uL — ABNORMAL LOW (ref 3.70–5.45)
WBC: 7.4 10*3/uL (ref 3.9–10.3)

## 2010-01-12 LAB — BASIC METABOLIC PANEL
CO2: 29 mEq/L (ref 19–32)
Calcium: 9.3 mg/dL (ref 8.4–10.5)
Glucose, Bld: 120 mg/dL — ABNORMAL HIGH (ref 70–99)
Sodium: 142 mEq/L (ref 135–145)

## 2010-01-15 LAB — BASIC METABOLIC PANEL
CO2: 29 mEq/L (ref 19–32)
Calcium: 9.4 mg/dL (ref 8.4–10.5)
Creatinine, Ser: 0.88 mg/dL (ref 0.40–1.20)

## 2010-01-18 ENCOUNTER — Ambulatory Visit: Payer: Self-pay | Admitting: Oncology

## 2010-02-11 ENCOUNTER — Encounter: Payer: Self-pay | Admitting: Cardiology

## 2010-03-17 ENCOUNTER — Ambulatory Visit: Payer: Self-pay | Admitting: Oncology

## 2010-03-19 ENCOUNTER — Encounter: Payer: Self-pay | Admitting: Cardiology

## 2010-03-19 LAB — CBC WITH DIFFERENTIAL/PLATELET
EOS%: 1.5 % (ref 0.0–7.0)
Eosinophils Absolute: 0 10*3/uL (ref 0.0–0.5)
MCH: 37.8 pg — ABNORMAL HIGH (ref 25.1–34.0)
MCV: 107.3 fL — ABNORMAL HIGH (ref 79.5–101.0)
MONO%: 14.8 % — ABNORMAL HIGH (ref 0.0–14.0)
NEUT#: 0.2 10*3/uL — CL (ref 1.5–6.5)
RBC: 2.94 10*6/uL — ABNORMAL LOW (ref 3.70–5.45)
RDW: 12.6 % (ref 11.2–14.5)
lymph#: 1.9 10*3/uL (ref 0.9–3.3)

## 2010-03-24 LAB — CBC WITH DIFFERENTIAL/PLATELET
Eosinophils Absolute: 0 10*3/uL (ref 0.0–0.5)
LYMPH%: 75.6 % — ABNORMAL HIGH (ref 14.0–49.7)
MCV: 105.9 fL — ABNORMAL HIGH (ref 79.5–101.0)
MONO#: 0.5 10*3/uL (ref 0.1–0.9)
MONO%: 20.2 % — ABNORMAL HIGH (ref 0.0–14.0)
NEUT%: 2.6 % — ABNORMAL LOW (ref 38.4–76.8)

## 2010-03-30 DIAGNOSIS — D649 Anemia, unspecified: Secondary | ICD-10-CM

## 2010-03-30 DIAGNOSIS — I10 Essential (primary) hypertension: Secondary | ICD-10-CM | POA: Insufficient documentation

## 2010-03-30 DIAGNOSIS — C8591 Non-Hodgkin lymphoma, unspecified, lymph nodes of head, face, and neck: Secondary | ICD-10-CM | POA: Insufficient documentation

## 2010-03-30 DIAGNOSIS — R000 Tachycardia, unspecified: Secondary | ICD-10-CM

## 2010-03-30 DIAGNOSIS — E871 Hypo-osmolality and hyponatremia: Secondary | ICD-10-CM | POA: Insufficient documentation

## 2010-03-30 DIAGNOSIS — E876 Hypokalemia: Secondary | ICD-10-CM

## 2010-03-30 DIAGNOSIS — D696 Thrombocytopenia, unspecified: Secondary | ICD-10-CM | POA: Insufficient documentation

## 2010-03-30 DIAGNOSIS — J9600 Acute respiratory failure, unspecified whether with hypoxia or hypercapnia: Secondary | ICD-10-CM | POA: Insufficient documentation

## 2010-03-30 DIAGNOSIS — Z8679 Personal history of other diseases of the circulatory system: Secondary | ICD-10-CM | POA: Insufficient documentation

## 2010-03-31 ENCOUNTER — Ambulatory Visit: Payer: Self-pay | Admitting: Cardiology

## 2010-03-31 LAB — CBC WITH DIFFERENTIAL/PLATELET
Basophils Absolute: 0 10*3/uL (ref 0.0–0.1)
EOS%: 0.9 % (ref 0.0–7.0)
HCT: 31.3 % — ABNORMAL LOW (ref 34.8–46.6)
HGB: 11.2 g/dL — ABNORMAL LOW (ref 11.6–15.9)
LYMPH%: 54.9 % — ABNORMAL HIGH (ref 14.0–49.7)
MCH: 38.7 pg — ABNORMAL HIGH (ref 25.1–34.0)
MCHC: 36 g/dL (ref 31.5–36.0)
MCV: 107.5 fL — ABNORMAL HIGH (ref 79.5–101.0)
MONO%: 13.2 % (ref 0.0–14.0)
NEUT%: 30.8 % — ABNORMAL LOW (ref 38.4–76.8)
Platelets: 129 10*3/uL — ABNORMAL LOW (ref 145–400)
lymph#: 2.4 10*3/uL (ref 0.9–3.3)

## 2010-04-15 ENCOUNTER — Encounter: Payer: Self-pay | Admitting: Cardiology

## 2010-04-15 LAB — CBC WITH DIFFERENTIAL/PLATELET
BASO%: 0.2 % (ref 0.0–2.0)
Basophils Absolute: 0 10*3/uL (ref 0.0–0.1)
EOS%: 1.8 % (ref 0.0–7.0)
HGB: 10.9 g/dL — ABNORMAL LOW (ref 11.6–15.9)
MCH: 38.6 pg — ABNORMAL HIGH (ref 25.1–34.0)
MCHC: 35.9 g/dL (ref 31.5–36.0)
MCV: 107.5 fL — ABNORMAL HIGH (ref 79.5–101.0)
MONO%: 8.4 % (ref 0.0–14.0)
RDW: 13 % (ref 11.2–14.5)

## 2010-04-30 ENCOUNTER — Ambulatory Visit: Payer: Self-pay | Admitting: Internal Medicine

## 2010-04-30 DIAGNOSIS — Z860100 Personal history of colon polyps, unspecified: Secondary | ICD-10-CM | POA: Insufficient documentation

## 2010-05-12 ENCOUNTER — Ambulatory Visit (HOSPITAL_BASED_OUTPATIENT_CLINIC_OR_DEPARTMENT_OTHER): Payer: Medicare Other | Admitting: Oncology

## 2010-05-12 LAB — CBC WITH DIFFERENTIAL/PLATELET
BASO%: 0.6 % (ref 0.0–2.0)
EOS%: 0.9 % (ref 0.0–7.0)
HCT: 30.9 % — ABNORMAL LOW (ref 34.8–46.6)
MCH: 35.6 pg — ABNORMAL HIGH (ref 25.1–34.0)
MCHC: 34.3 g/dL (ref 31.5–36.0)
MONO#: 1 10*3/uL — ABNORMAL HIGH (ref 0.1–0.9)
RBC: 2.98 10*6/uL — ABNORMAL LOW (ref 3.70–5.45)
RDW: 12.7 % (ref 11.2–14.5)
WBC: 3.3 10*3/uL — ABNORMAL LOW (ref 3.9–10.3)
lymph#: 1.7 10*3/uL (ref 0.9–3.3)
nRBC: 0 % (ref 0–0)

## 2010-05-12 LAB — TECHNOLOGIST REVIEW

## 2010-05-17 LAB — CBC WITH DIFFERENTIAL/PLATELET
BASO%: 0.1 % (ref 0.0–2.0)
Eosinophils Absolute: 0 10*3/uL (ref 0.0–0.5)
LYMPH%: 41.8 % (ref 14.0–49.7)
MCHC: 35.9 g/dL (ref 31.5–36.0)
MONO#: 0.5 10*3/uL (ref 0.1–0.9)
NEUT#: 1.7 10*3/uL (ref 1.5–6.5)
Platelets: 94 10*3/uL — ABNORMAL LOW (ref 145–400)
RBC: 2.62 10*6/uL — ABNORMAL LOW (ref 3.70–5.45)
RDW: 12.3 % (ref 11.2–14.5)
WBC: 3.9 10*3/uL (ref 3.9–10.3)

## 2010-05-18 ENCOUNTER — Telehealth: Payer: Self-pay | Admitting: Internal Medicine

## 2010-05-19 LAB — CBC WITH DIFFERENTIAL/PLATELET
BASO%: 0.1 % (ref 0.0–2.0)
Eosinophils Absolute: 0 10*3/uL (ref 0.0–0.5)
HCT: 28.8 % — ABNORMAL LOW (ref 34.8–46.6)
MCHC: 35.3 g/dL (ref 31.5–36.0)
MONO#: 0.5 10*3/uL (ref 0.1–0.9)
NEUT#: 1.1 10*3/uL — ABNORMAL LOW (ref 1.5–6.5)
NEUT%: 37.6 % — ABNORMAL LOW (ref 38.4–76.8)
RBC: 2.73 10*6/uL — ABNORMAL LOW (ref 3.70–5.45)
WBC: 3.1 10*3/uL — ABNORMAL LOW (ref 3.9–10.3)
lymph#: 1.4 10*3/uL (ref 0.9–3.3)

## 2010-05-26 LAB — HM MAMMOGRAPHY: HM Mammogram: NORMAL

## 2010-05-27 DIAGNOSIS — C8589 Other specified types of non-Hodgkin lymphoma, extranodal and solid organ sites: Secondary | ICD-10-CM

## 2010-05-27 DIAGNOSIS — D61818 Other pancytopenia: Secondary | ICD-10-CM

## 2010-05-27 LAB — CBC WITH DIFFERENTIAL/PLATELET
Basophils Absolute: 0 10*3/uL (ref 0.0–0.1)
Eosinophils Absolute: 0 10*3/uL (ref 0.0–0.5)
HGB: 9.4 g/dL — ABNORMAL LOW (ref 11.6–15.9)
LYMPH%: 51.6 % — ABNORMAL HIGH (ref 14.0–49.7)
MCV: 105.1 fL — ABNORMAL HIGH (ref 79.5–101.0)
MONO%: 12.9 % (ref 0.0–14.0)
NEUT#: 1.1 10*3/uL — ABNORMAL LOW (ref 1.5–6.5)
Platelets: 92 10*3/uL — ABNORMAL LOW (ref 145–400)

## 2010-05-27 LAB — BASIC METABOLIC PANEL
BUN: 18 mg/dL (ref 6–23)
CO2: 28 mEq/L (ref 19–32)
Calcium: 8.9 mg/dL (ref 8.4–10.5)
Glucose, Bld: 96 mg/dL (ref 70–99)
Potassium: 3.9 mEq/L (ref 3.5–5.3)

## 2010-06-03 ENCOUNTER — Encounter: Payer: Self-pay | Admitting: Internal Medicine

## 2010-06-07 ENCOUNTER — Telehealth: Payer: Self-pay | Admitting: Internal Medicine

## 2010-06-10 LAB — CBC WITH DIFFERENTIAL/PLATELET
BASO%: 0.6 % (ref 0.0–2.0)
EOS%: 1.1 % (ref 0.0–7.0)
HCT: 30.7 % — ABNORMAL LOW (ref 34.8–46.6)
LYMPH%: 35.8 % (ref 14.0–49.7)
MCH: 38.2 pg — ABNORMAL HIGH (ref 25.1–34.0)
MCHC: 35.2 g/dL (ref 31.5–36.0)
MONO#: 0.5 10*3/uL (ref 0.1–0.9)
NEUT%: 51.3 % (ref 38.4–76.8)
Platelets: 141 10*3/uL — ABNORMAL LOW (ref 145–400)
RBC: 2.83 10*6/uL — ABNORMAL LOW (ref 3.70–5.45)
WBC: 4.5 10*3/uL (ref 3.9–10.3)

## 2010-06-22 ENCOUNTER — Encounter: Payer: Self-pay | Admitting: Internal Medicine

## 2010-06-24 ENCOUNTER — Ambulatory Visit: Payer: Self-pay | Admitting: Oncology

## 2010-06-28 ENCOUNTER — Encounter: Payer: Self-pay | Admitting: Internal Medicine

## 2010-06-28 ENCOUNTER — Encounter: Payer: Self-pay | Admitting: Cardiology

## 2010-06-28 LAB — CBC WITH DIFFERENTIAL/PLATELET
BASO%: 0.3 % (ref 0.0–2.0)
Basophils Absolute: 0 10*3/uL (ref 0.0–0.1)
EOS%: 1.2 % (ref 0.0–7.0)
Eosinophils Absolute: 0 10*3/uL (ref 0.0–0.5)
HCT: 33 % — ABNORMAL LOW (ref 34.8–46.6)
HGB: 11.6 g/dL (ref 11.6–15.9)
LYMPH%: 45.5 % (ref 14.0–49.7)
MCH: 37.5 pg — ABNORMAL HIGH (ref 25.1–34.0)
MCHC: 35.2 g/dL (ref 31.5–36.0)
MCV: 106.5 fL — ABNORMAL HIGH (ref 79.5–101.0)
MONO#: 0.5 10*3/uL (ref 0.1–0.9)
MONO%: 14 % (ref 0.0–14.0)
NEUT#: 1.4 10*3/uL — ABNORMAL LOW (ref 1.5–6.5)
NEUT%: 39 % (ref 38.4–76.8)
Platelets: 137 10*3/uL — ABNORMAL LOW (ref 145–400)
RBC: 3.1 10*6/uL — ABNORMAL LOW (ref 3.70–5.45)
RDW: 13.2 % (ref 11.2–14.5)
WBC: 3.6 10*3/uL — ABNORMAL LOW (ref 3.9–10.3)
lymph#: 1.6 10*3/uL (ref 0.9–3.3)

## 2010-07-10 ENCOUNTER — Encounter: Payer: Self-pay | Admitting: Family Medicine

## 2010-07-11 ENCOUNTER — Encounter: Payer: Self-pay | Admitting: Family Medicine

## 2010-07-20 NOTE — Progress Notes (Signed)
  Phone Note Call from Patient Call back at Home Phone [phone]   Caller: Patient Call For: Etta Grandchild MD Summary of Call: Pt advised not to do a colonoscopy until her white blood cell count is up per Dr Rica Mast at [facility].Pt had stem cell transplant. FYI. Initial call taken by: Verdell Face,  May 18, 2010 4:04 PM

## 2010-07-20 NOTE — Assessment & Plan Note (Signed)
Summary: np6/eval bp   CC:  dizziness pt states it may be related to antibiotic  she was on.  History of Present Illness: 65 year old female for evaluation of hypertension. No prior cardiac history. Patient has a lymphoma and underwent stem cell transplant in May of 2011. She then had pneumonia and sepsis in June of 2011. Her blood pressure medications apparently were stopped. She then had problems with hypertension and has now resumed her beta blocker and ARB. She states her blood pressure is now controlled but she wanted further evaluation. She denies dyspnea on exertion, orthopnea, PND, pedal edema, palpitations, syncope or chest pain.  Current Medications (verified): 1)  Potassium Chloride Crys Cr 20 Meq Cr-Tabs (Potassium Chloride Crys Cr) .Marland Kitchen.. 1 Tab By Mouth Once Daily 2)  Bystolic 5 Mg Tabs (Nebivolol Hcl) .Marland Kitchen.. 1 Tab By Mouth Once Daily 3)  Losartan Potassium-Hctz 50-12.5 Mg Tabs (Losartan Potassium-Hctz) .Marland Kitchen.. 1  Tab By Mouth Once Daily 4)  Acyclovir 800 Mg Tabs (Acyclovir) .Marland Kitchen.. 1 Tab By Mouth Two Times A Day  Past History:  Past Medical History: HYPERTENSION  H/O hypothyroid Hiatal Hernia NON-HODGKIN'S LYMPHOMA  Healthcare-associated versus community-acquired pneumonia.   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  Hysterectomy  Family History: Reviewed history from 03/30/2010 and no changes required. Remarkable for hypertension, otherwise is unremarkable No premature CAD  Social History: Reviewed history from 03/30/2010 and no changes required.  She has never smoked.  She is married and currently   lives with her husband.  Alcohol Use - no  Review of Systems       no fevers or chills, productive cough, hemoptysis, dysphasia, odynophagia, melena, hematochezia, dysuria, hematuria, rash, seizure activity, orthopnea, PND, pedal edema, claudication. Remaining systems are negative.   Vital Signs:  Patient profile:   65  year old female Height:      65 inches Weight:      150 pounds BMI:     25.05 Pulse rate:   77 / minute Resp:     14 per minute BP sitting:   133 / 73  (left arm)  Vitals Entered By: Kem Parkinson (March 31, 2010 3:28 PM)  Physical Exam  General:  Well developed/well nourished in NAD Skin warm/dry Patient not depressed No peripheral clubbing Back-normal HEENT-normal/normal eyelids Neck supple/normal carotid upstroke bilaterally; no bruits; no JVD; no thyromegaly chest - CTA/ normal expansion CV - RRR/normal S1 and S2; no murmurs, rubs or gallops;  PMI nondisplaced Abdomen -NT/ND, no HSM, no mass, + bowel sounds, no bruit 2+ femoral pulses, no bruits Ext-no edema, chords, 2+ DP Neuro-grossly nonfocal      EKG  Procedure date:  03/31/2010  Findings:      Sinus rhythm at a rate of 77. Right bundle branch block. Left anterior fascicular block.  Impression & Recommendations:  Problem # 1:  HYPERTENSION (ICD-401.9) The patient's blood pressure is controlled on present medications. Will continue. I think the recent fluctuation was related to her stem cell transplant followed by sepsis and then discontinuation of her antihypertensives. Her blood pressure is now much better. She will follow this at home and we will increase her medications as needed. I have also referred her to a primary care physician to follow this long-term. Her updated medication list for this problem includes:    Bystolic 5 Mg Tabs (Nebivolol hcl) .Marland Kitchen... 1 tab by mouth once daily    Losartan Potassium-hctz 50-12.5 Mg Tabs (Losartan potassium-hctz) .Marland Kitchen... 1  tab  by mouth once daily  Orders: Primary Care Referral (Primary)  Problem # 2:  NON-HODGKIN'S LYMPHOMA (ICD-202.80) Management per hematology oncology.  Other Orders: EKG w/ Interpretation (93000)  Patient Instructions: 1)  Your physician recommends that you schedule a follow-up appointment in: AS NEEDED 2)  Your physician recommends that you  continue on your current medications as directed. Please refer to the Current Medication list given to you today. 3)  You have been referred to PMD AT [facility] Prescriptions: LOSARTAN POTASSIUM-HCTZ 50-12.5 MG TABS (LOSARTAN POTASSIUM-HCTZ) 1  tab by mouth once daily  #30 x 11   Entered by:   Scherrie Bateman, LPN   Authorized by:   Ferman Hamming, MD, [facility]   Signed by:   Scherrie Bateman, LPN on 16/03/9603   Method used:   Electronically to        [facility]       [address]       [place], [place]  [address]       Ph: [phone]       Fax: [phone]   RxID:   [id number] BYSTOLIC 5 MG TABS (NEBIVOLOL HCL) 1 tab by mouth once daily  #30 x 11   Entered by:   Scherrie Bateman, LPN   Authorized by:   Ferman Hamming, MD, [facility]   Signed by:   Scherrie Bateman, LPN on 04/16/2535   Method used:   Electronically to        [facility]       [address]       [place], [place]  [address]       Ph: [phone]       Fax: [phone]   RxID:   [id number]

## 2010-07-20 NOTE — Letter (Signed)
[facility]   Imported By: Roderic Ovens 04/05/2010 15:05:43  _____________________________________________________________________  External Attachment:    Type:   Image     Comment:   External Document

## 2010-07-20 NOTE — Letter (Signed)
Summary: [facility]  [facility]   Imported By: Sherian Rein 04/30/2010 10:57:19  _____________________________________________________________________  External Attachment:    Type:   Image     Comment:   External Document

## 2010-07-20 NOTE — Letter (Signed)
Summary: [facility]: Phys[facility] Documentation [facility]  [facility]: [facility] Documentation [facility]   Imported By: Earl Many 03/30/2010 16:32:37  _____________________________________________________________________  External Attachment:    Type:   Image     Comment:   External Document

## 2010-07-20 NOTE — Letter (Signed)
[facility]   Imported By: Roderic Ovens 04/05/2010 15:05:25  _____________________________________________________________________  External Attachment:    Type:   Image     Comment:   External Document

## 2010-07-20 NOTE — Assessment & Plan Note (Signed)
Summary: New / Medicare / # cd   Vital Signs:  Patient profile:   65 year old female Menstrual status:  hysterectomy Height:      65 inches Weight:      151 pounds BMI:     25.22 O2 Sat:      95 % on Room air Temp:     98.6 degrees F oral Pulse rate:   75 / minute Pulse rhythm:   regular Resp:     16 per minute BP sitting:   136 / 72  (left arm) Cuff size:   large  Vitals Entered By: Rock Nephew CMA (April 30, 2010 1:32 PM)  Nutrition Counseling: Patient's BMI is greater than 25 and therefore counseled on weight management options.  O2 Flow:  Room air CC: New to estavlish, Preventive Care, Hypertension Management Is Patient Diabetic? No Pain Assessment Patient in pain? no       Does patient need assistance? Functional Status Self care Ambulation Normal     Menstrual Status hysterectomy Last PAP Result Done   Primary Care Provider:  Etta Grandchild MD  CC:  New to estavlish, Preventive Care, and Hypertension Management.  History of Present Illness: New to me she needs a PCP.  Hypertension History:      She denies headache, chest pain, palpitations, dyspnea with exertion, orthopnea, PND, peripheral edema, visual symptoms, neurologic problems, syncope, and side effects from treatment.  She notes no problems with any antihypertensive medication side effects.        Positive major cardiovascular risk factors include female age 16 years old or older and hypertension.  Negative major cardiovascular risk factors include no history of diabetes or hyperlipidemia, negative family history for ischemic heart disease, and non-tobacco-user status.        Further assessment for target organ damage reveals no history of ASHD, cardiac end-organ damage (CHF/LVH), stroke/TIA, peripheral vascular disease, renal insufficiency, or hypertensive retinopathy.     Preventive Screening-Counseling & Management  Alcohol-Tobacco     Alcohol drinks/day: 0     Alcohol Counseling: not  indicated; patient does not drink     Smoking Status: never     Tobacco Counseling: not indicated; no tobacco use  Hep-HIV-STD-Contraception     Hepatitis Risk: no risk noted     HIV Risk: no risk noted     STD Risk: no risk noted      Sexual History:  currently monogamous.        Drug Use:  never.        Blood Transfusions:  no.    Medications Prior to Update: 1)  Potassium Chloride Crys Cr 20 Meq Cr-Tabs (Potassium Chloride Crys Cr) .Marland Kitchen.. 1 Tab By Mouth Once Daily 2)  Bystolic 5 Mg Tabs (Nebivolol Hcl) .Marland Kitchen.. 1 Tab By Mouth Once Daily 3)  Losartan Potassium-Hctz 50-12.5 Mg Tabs (Losartan Potassium-Hctz) .Marland Kitchen.. 1  Tab By Mouth Once Daily 4)  Acyclovir 800 Mg Tabs (Acyclovir) .Marland Kitchen.. 1 Tab By Mouth Two Times A Day  Current Medications (verified): 1)  Potassium Chloride Crys Cr 20 Meq Cr-Tabs (Potassium Chloride Crys Cr) .Marland Kitchen.. 1 Tab By Mouth Once Daily 2)  Bystolic 5 Mg Tabs (Nebivolol Hcl) .Marland Kitchen.. 1 Tab By Mouth Once Daily 3)  Losartan Potassium-Hctz 50-12.5 Mg Tabs (Losartan Potassium-Hctz) .Marland Kitchen.. 1  Tab By Mouth Once Daily 4)  Acyclovir 800 Mg Tabs (Acyclovir) .Marland Kitchen.. 1 Tab By Mouth Two Times A Day  Allergies (verified): 1)  ! Penicillin  Past History:  Family  History: Last updated: 03/31/2010 Remarkable for hypertension, otherwise is unremarkable No premature CAD  Social History: Last updated: 03/31/2010  She has never smoked.  She is married and currently   lives with her husband.  Alcohol Use - no  Risk Factors: Alcohol Use: 0 (04/30/2010)  Risk Factors: Smoking Status: never (04/30/2010)  Past Medical History: HYPERTENSION  H/O hypothyroid Hiatal Hernia NON-HODGKIN'S LYMPHOMA  Healthcare-associated versus community-acquired pneumonia.  Colonic polyps, hx of  Past Surgical History: Reviewed history from 03/31/2010 and no changes required. 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  Hysterectomy  Family History: Reviewed history from  03/31/2010 and no changes required. Remarkable for hypertension, otherwise is unremarkable No premature CAD  Social History: Reviewed history from 03/31/2010 and no changes required.  She has never smoked.  She is married and currently   lives with her husband.  Alcohol Use - no Smoking Status:  never Hepatitis Risk:  no risk noted HIV Risk:  no risk noted STD Risk:  no risk noted Sexual History:  currently monogamous Drug Use:  never Blood Transfusions:  no  Review of Systems  The patient denies anorexia, fever, weight loss, weight gain, chest pain, syncope, dyspnea on exertion, peripheral edema, prolonged cough, headaches, hemoptysis, abdominal pain, hematochezia, hematuria, depression, enlarged lymph nodes, and breast masses.    Physical Exam  General:  alert, well-developed, well-nourished, and well-hydrated.   Head:  normocephalic, atraumatic, and diffuse alopecia.   Eyes:  vision grossly intact and no injection.   Mouth:  Oral mucosa and oropharynx without lesions or exudates.  Teeth in good repair. Neck:  supple, full ROM, no masses, no thyromegaly, no thyroid nodules or tenderness, no JVD, normal carotid upstroke, and no cervical lymphadenopathy.   Lungs:  normal respiratory effort, no intercostal retractions, no accessory muscle use, normal breath sounds, no dullness, no fremitus, no crackles, and no wheezes.   Heart:  normal rate, regular rhythm, no murmur, no gallop, no rub, and no JVD.   Abdomen:  soft, non-tender, normal bowel sounds, no distention, no masses, no guarding, no rigidity, no rebound tenderness, no abdominal hernia, no inguinal hernia, no hepatomegaly, and no splenomegaly.   Msk:  No deformity or scoliosis noted of thoracic or lumbar spine.   Pulses:  R and L carotid,radial,femoral,dorsalis pedis and posterior tibial pulses are full and equal bilaterally Extremities:  No clubbing, cyanosis, edema, or deformity noted with normal full range of motion of all  joints.   Neurologic:  No cranial nerve deficits noted. Station and gait are normal. Plantar reflexes are down-going bilaterally. DTRs are symmetrical throughout. Sensory, motor and coordinative functions appear intact. Skin:  turgor normal, color normal, no rashes, no suspicious lesions, no ecchymoses, no petechiae, no purpura, no ulcerations, and no edema.   Cervical Nodes:  no anterior cervical adenopathy and no posterior cervical adenopathy.   Axillary Nodes:  no R axillary adenopathy and no L axillary adenopathy.   Inguinal Nodes:  no R inguinal adenopathy and no L inguinal adenopathy.   Psych:  Cognition and judgment appear intact. Alert and cooperative with normal attention span and concentration. No apparent delusions, illusions, hallucinations   Impression & Recommendations:  Problem # 1:  COLONIC POLYPS, HX OF (ICD-V12.72) Assessment Unchanged  Orders: Gastroenterology Referral (GI)  Problem # 2:  HYPERTENSION (ICD-401.9) Assessment: Improved  Her updated medication list for this problem includes:    Bystolic 5 Mg Tabs (Nebivolol hcl) .Marland Kitchen... 1 tab by mouth once daily  Losartan Potassium-hctz 50-12.5 Mg Tabs (Losartan potassium-hctz) .Marland Kitchen... 1  tab by mouth once daily  Orders: Venipuncture (67124) TLB-BMP (Basic Metabolic Panel-BMET) (80048-METABOL) TLB-CBC Platelet - w/Differential (85025-CBCD) TLB-Hepatic/Liver Function Pnl (80076-HEPATIC) TLB-TSH (Thyroid Stimulating Hormone) (84443-TSH)  BP today: 136/72 Prior BP: 133/73 (03/31/2010)  10 Yr Risk Heart Disease: Not enough information  Complete Medication List: 1)  Potassium Chloride Crys Cr 20 Meq Cr-tabs (Potassium chloride crys cr) .Marland Kitchen.. 1 tab by mouth once daily 2)  Bystolic 5 Mg Tabs (Nebivolol hcl) .Marland Kitchen.. 1 tab by mouth once daily 3)  Losartan Potassium-hctz 50-12.5 Mg Tabs (Losartan potassium-hctz) .Marland Kitchen.. 1  tab by mouth once daily 4)  Acyclovir 800 Mg Tabs (Acyclovir) .Marland Kitchen.. 1 tab by mouth two times a day  Other  Orders: Radiology Referral (Radiology)  Hypertension Assessment/Plan:      The patient's hypertensive risk group is category B: At least one risk factor (excluding diabetes) with no target organ damage.  Today's blood pressure is 136/72.  Her blood pressure goal is < 140/90.  Colorectal Screening:  Current Recommendations:    Colonoscopy recommended: scheduled with [facility]  PAP Screening:    Hx Cervical Dysplasia in last 5 yrs? No    3 normal PAP smears in last 5 yrs? Yes    Last PAP smear:  06/20/2005    Reviewed PAP smear recommendations:  patient refuses understanding risks of delayed diagnosis  Mammogram Screening:    Last Mammogram:  06/20/2006    Reviewed Mammogram recommendations:  mammogram ordered  Osteoporosis Risk Assessment:  Risk Factors for Fracture or Low Bone Density:   Race (White or Asian):     yes   Smoking status:       never  Immunization & Chemoprophylaxis:    Tetanus vaccine: Historical  (06/20/2004)  Patient Instructions: 1)  Please schedule a follow-up appointment in 4 months. 2)  Check your Blood Pressure regularly. If it is above 140/90: you should make an appointment.   Orders Added: 1)  Venipuncture [36415] 2)  TLB-BMP (Basic Metabolic Panel-BMET) [80048-METABOL] 3)  TLB-CBC Platelet - w/Differential [85025-CBCD] 4)  TLB-Hepatic/Liver Function Pnl [80076-HEPATIC] 5)  TLB-TSH (Thyroid Stimulating Hormone) [84443-TSH] 6)  Radiology Referral [Radiology] 7)  Gastroenterology Referral [GI] 8)  New Patient Level III [99203]   Not Administered:    Tetanus Vaccine not given due to: declined/pt will have done at [facility]    Influenza Vaccine not given due to: declined/ pt will have done at [facility]   Preventive Care Screening  Mammogram:    Date:  06/20/2006    Results:  normal   Pap Smear:    Date:  06/20/2005    Results:  Done   Last Tetanus Booster:    Date:  06/20/2004    Results:  Historical

## 2010-07-22 NOTE — Letter (Signed)
Summary: Referral - not able to see patient  [facility]  [address] [place], [place] [address]   Phone: [phone]  Fax: [phone]    June 22, 2010   Sanda Linger, MD [address] [place], [place] [address]    Re:   Jamie Villa DOB:  1945-07-21 MRN:   [id number]    Dear Dr. Yetta Barre:  Thank you for your kind referral of the above patient.  We have attempted to schedule the recommended procedure Screening Colonoscopy but have not been able to schedule because:  X   The patient was not available by phone and/or has not returned our calls.  ___ The patient declined to schedule the procedure at this time.  We appreciate the referral and hope that we will have the opportunity to treat this patient in the future.    Sincerely,    [facility] [phone]

## 2010-07-22 NOTE — Progress Notes (Signed)
    PAP Screening:    Last PAP smear:  06/20/2005  Mammogram Screening:    Last Mammogram:  06/20/2006  Mammogram Results:    Date of Exam:  06/03/2010    Results:  Normal Bilateral  Osteoporosis Risk Assessment:  Risk Factors for Fracture or Low Bone Density:   Race (White or Asian):     yes   Smoking status:       never  Immunization & Chemoprophylaxis:    Tetanus vaccine: Historical  (06/20/2004)

## 2010-07-28 NOTE — Letter (Signed)
Summary: [facility]  [facility]   Imported By: Sherian Rein 07/22/2010 09:55:44  _____________________________________________________________________  External Attachment:    Type:   Image     Comment:   External Document

## 2010-08-11 NOTE — Letter (Signed)
Summary: [facility]: Office Progress Note  [facility]: Office Progress Note   Imported By: Earl Many 08/04/2010 08:58:10  _____________________________________________________________________  External Attachment:    Type:   Image     Comment:   External Document

## 2010-08-19 ENCOUNTER — Encounter (HOSPITAL_BASED_OUTPATIENT_CLINIC_OR_DEPARTMENT_OTHER): Payer: Medicare Other | Admitting: Oncology

## 2010-08-19 ENCOUNTER — Other Ambulatory Visit: Payer: Self-pay | Admitting: Oncology

## 2010-08-19 DIAGNOSIS — E876 Hypokalemia: Secondary | ICD-10-CM

## 2010-08-19 DIAGNOSIS — C8589 Other specified types of non-Hodgkin lymphoma, extranodal and solid organ sites: Secondary | ICD-10-CM

## 2010-08-19 DIAGNOSIS — D702 Other drug-induced agranulocytosis: Secondary | ICD-10-CM

## 2010-08-19 DIAGNOSIS — D61818 Other pancytopenia: Secondary | ICD-10-CM

## 2010-08-19 LAB — BASIC METABOLIC PANEL
BUN: 20 mg/dL (ref 6–23)
CO2: 31 mEq/L (ref 19–32)
Chloride: 107 mEq/L (ref 96–112)
Creatinine, Ser: 0.84 mg/dL (ref 0.40–1.20)
Glucose, Bld: 84 mg/dL (ref 70–99)

## 2010-08-19 LAB — CBC WITH DIFFERENTIAL/PLATELET
Basophils Absolute: 0 10*3/uL (ref 0.0–0.1)
HCT: 33.6 % — ABNORMAL LOW (ref 34.8–46.6)
HGB: 11.8 g/dL (ref 11.6–15.9)
MONO#: 0.5 10*3/uL (ref 0.1–0.9)
NEUT%: 45.4 % (ref 38.4–76.8)
Platelets: 143 10*3/uL — ABNORMAL LOW (ref 145–400)
WBC: 3.5 10*3/uL — ABNORMAL LOW (ref 3.9–10.3)
lymph#: 1.3 10*3/uL (ref 0.9–3.3)

## 2010-09-05 LAB — DIFFERENTIAL
Basophils Absolute: 0 10*3/uL (ref 0.0–0.1)
Basophils Absolute: 0 10*3/uL (ref 0.0–0.1)
Basophils Absolute: 0 10*3/uL (ref 0.0–0.1)
Basophils Absolute: 0 10*3/uL (ref 0.0–0.1)
Basophils Relative: 0 % (ref 0–1)
Eosinophils Absolute: 0.1 10*3/uL (ref 0.0–0.7)
Eosinophils Relative: 1 % (ref 0–5)
Lymphocytes Relative: 17 % (ref 12–46)
Lymphocytes Relative: 20 % (ref 12–46)
Lymphocytes Relative: 22 % (ref 12–46)
Lymphs Abs: 1 10*3/uL (ref 0.7–4.0)
Monocytes Relative: 7 % (ref 3–12)
Monocytes Relative: 9 % (ref 3–12)
Monocytes Relative: 9 % (ref 3–12)
Monocytes Relative: 9 % (ref 3–12)
Neutro Abs: 3.8 10*3/uL (ref 1.7–7.7)
Neutro Abs: 4.7 10*3/uL (ref 1.7–7.7)
Neutro Abs: 6.4 10*3/uL (ref 1.7–7.7)
Neutrophils Relative %: 69 % (ref 43–77)
Neutrophils Relative %: 75 % (ref 43–77)

## 2010-09-05 LAB — CULTURE, BLOOD (ROUTINE X 2): Culture: NO GROWTH

## 2010-09-05 LAB — COMPREHENSIVE METABOLIC PANEL
AST: 46 U/L — ABNORMAL HIGH (ref 0–37)
Albumin: 2.2 g/dL — ABNORMAL LOW (ref 3.5–5.2)
Alkaline Phosphatase: 101 U/L (ref 39–117)
Alkaline Phosphatase: 98 U/L (ref 39–117)
BUN: 9 mg/dL (ref 6–23)
BUN: 9 mg/dL (ref 6–23)
CO2: 23 mEq/L (ref 19–32)
CO2: 25 mEq/L (ref 19–32)
Chloride: 106 mEq/L (ref 96–112)
Chloride: 106 mEq/L (ref 96–112)
Chloride: 108 mEq/L (ref 96–112)
Creatinine, Ser: 0.63 mg/dL (ref 0.4–1.2)
Creatinine, Ser: 0.65 mg/dL (ref 0.4–1.2)
Creatinine, Ser: 0.74 mg/dL (ref 0.4–1.2)
GFR calc Af Amer: >60 mL/min (ref >60)
GFR calc non Af Amer: >60 mL/min (ref >60)
GFR calc non Af Amer: >60 mL/min (ref >60)
Glucose, Bld: 108 mg/dL — ABNORMAL HIGH (ref 70–99)
Glucose, Bld: 96 mg/dL (ref 70–99)
Potassium: 3.3 mEq/L — ABNORMAL LOW (ref 3.5–5.1)
Potassium: 3.4 mEq/L — ABNORMAL LOW (ref 3.5–5.1)
Total Bilirubin: 0.9 mg/dL (ref 0.3–1.2)
Total Bilirubin: 0.9 mg/dL (ref 0.3–1.2)
Total Bilirubin: 0.9 mg/dL (ref 0.3–1.2)
Total Protein: 4.7 g/dL — ABNORMAL LOW (ref 6.0–8.3)

## 2010-09-05 LAB — CROSSMATCH

## 2010-09-05 LAB — CBC
HCT: 24.6 % — ABNORMAL LOW (ref 36.0–46.0)
HCT: 26 % — ABNORMAL LOW (ref 36.0–46.0)
HCT: 27.5 % — ABNORMAL LOW (ref 36.0–46.0)
Hemoglobin: 8.7 g/dL — ABNORMAL LOW (ref 12.0–15.0)
Hemoglobin: 9 g/dL — ABNORMAL LOW (ref 12.0–15.0)
MCH: 32.5 pg (ref 26.0–34.0)
MCH: 32.9 pg (ref 26.0–34.0)
MCV: 94.2 fL (ref 78.0–100.0)
MCV: 94.3 fL (ref 78.0–100.0)
MCV: 94.9 fL (ref 78.0–100.0)
Platelets: 103 10*3/uL — ABNORMAL LOW (ref 150–400)
Platelets: 106 10*3/uL — ABNORMAL LOW (ref 150–400)
Platelets: 93 10*3/uL — ABNORMAL LOW (ref 150–400)
Platelets: 95 10*3/uL — ABNORMAL LOW (ref 150–400)
RBC: 2.64 MIL/uL — ABNORMAL LOW (ref 3.87–5.11)
RBC: 2.65 MIL/uL — ABNORMAL LOW (ref 3.87–5.11)
RBC: 2.76 MIL/uL — ABNORMAL LOW (ref 3.87–5.11)
RBC: 2.89 MIL/uL — ABNORMAL LOW (ref 3.87–5.11)
RDW: 23 % — ABNORMAL HIGH (ref 11.5–15.5)
RDW: 23 % — ABNORMAL HIGH (ref 11.5–15.5)
RDW: 23.5 % — ABNORMAL HIGH (ref 11.5–15.5)
WBC: 5.4 10*3/uL (ref 4.0–10.5)
WBC: 6.7 10*3/uL (ref 4.0–10.5)
WBC: 9.2 10*3/uL (ref 4.0–10.5)
WBC: 9.3 10*3/uL (ref 4.0–10.5)

## 2010-09-05 LAB — URINE MICROSCOPIC-ADD ON

## 2010-09-05 LAB — URINALYSIS, ROUTINE W REFLEX MICROSCOPIC
Glucose, UA: NEGATIVE mg/dL
Specific Gravity, Urine: 1.007 (ref 1.005–1.030)
pH: 6 (ref 5.0–8.0)

## 2010-09-05 LAB — BASIC METABOLIC PANEL
BUN: 14 mg/dL (ref 6–23)
Chloride: 108 mEq/L (ref 96–112)
Glucose, Bld: 114 mg/dL — ABNORMAL HIGH (ref 70–99)
Potassium: 3.3 mEq/L — ABNORMAL LOW (ref 3.5–5.1)

## 2010-09-05 LAB — POCT I-STAT, CHEM 8
Creatinine, Ser: 0.9 mg/dL (ref 0.4–1.2)
Glucose, Bld: 110 mg/dL — ABNORMAL HIGH (ref 70–99)
Hemoglobin: 8.5 g/dL — ABNORMAL LOW (ref 12.0–15.0)
TCO2: 22 mmol/L (ref 0–100)

## 2010-09-05 LAB — URINE CULTURE

## 2010-09-08 LAB — PREPARE PLATELETS

## 2010-09-08 LAB — DIFFERENTIAL
Band Neutrophils: 0 % (ref 0–10)
Basophils Relative: 0 % (ref 0–1)
Eosinophils Absolute: 0 10*3/uL (ref 0.0–0.7)
Eosinophils Relative: 0 % (ref 0–5)
Monocytes Relative: 0 % — ABNORMAL LOW (ref 3–12)
nRBC: 0 /100 WBC

## 2010-09-08 LAB — ABO/RH: ABO/RH(D): A NEG

## 2010-09-08 LAB — CROSSMATCH
Antibody Screen: NEGATIVE
Antibody Screen: NEGATIVE

## 2010-09-08 LAB — CBC
HCT: 25.8 % — ABNORMAL LOW (ref 36.0–46.0)
Hemoglobin: 9.2 g/dL — ABNORMAL LOW (ref 12.0–15.0)
MCV: 96.6 fL (ref 78.0–100.0)
Platelets: 20 10*3/uL — CL (ref 150–400)
WBC: <0.4 10*3/uL — CL (ref 4.0–10.5)

## 2010-09-13 LAB — GLUCOSE, CAPILLARY: Glucose-Capillary: 100 mg/dL — ABNORMAL HIGH (ref 70–99)

## 2010-09-20 LAB — GLUCOSE, CAPILLARY: Glucose-Capillary: 94 mg/dL (ref 70–99)

## 2010-09-21 LAB — APTT: aPTT: 23 seconds — ABNORMAL LOW (ref 24–37)

## 2010-09-21 LAB — PROTIME-INR
INR: 0.87 (ref 0.00–1.49)
Prothrombin Time: 11.8 seconds (ref 11.6–15.2)

## 2010-09-21 LAB — CBC
Hemoglobin: 14.8 g/dL (ref 12.0–15.0)
MCHC: 35 g/dL (ref 30.0–36.0)
RBC: 4.4 MIL/uL (ref 3.87–5.11)
RDW: 12.8 % (ref 11.5–15.5)

## 2010-09-23 ENCOUNTER — Other Ambulatory Visit: Payer: Self-pay | Admitting: Oncology

## 2010-09-23 ENCOUNTER — Encounter (HOSPITAL_BASED_OUTPATIENT_CLINIC_OR_DEPARTMENT_OTHER): Payer: Medicare Other | Admitting: Oncology

## 2010-09-23 DIAGNOSIS — C8589 Other specified types of non-Hodgkin lymphoma, extranodal and solid organ sites: Secondary | ICD-10-CM

## 2010-09-23 LAB — CBC WITH DIFFERENTIAL/PLATELET
Eosinophils Absolute: 0 10*3/uL (ref 0.0–0.5)
HGB: 12.3 g/dL (ref 11.6–15.9)
LYMPH%: 35.1 % (ref 14.0–49.7)
MCHC: 34.9 g/dL (ref 31.5–36.0)
MCV: 103.6 fL — ABNORMAL HIGH (ref 79.5–101.0)
MONO#: 0.5 10*3/uL (ref 0.1–0.9)
NEUT#: 2.5 10*3/uL (ref 1.5–6.5)
NEUT%: 52.6 % (ref 38.4–76.8)

## 2010-09-23 LAB — COMPREHENSIVE METABOLIC PANEL WITH GFR
ALT: 40 U/L — ABNORMAL HIGH (ref 0–35)
AST: 33 U/L (ref 0–37)
Alkaline Phosphatase: 74 U/L (ref 39–117)
CO2: 29 meq/L (ref 19–32)
Calcium: 9.9 mg/dL (ref 8.4–10.5)
Chloride: 106 meq/L (ref 96–112)
GFR calc Af Amer: >60 mL/min (ref >60)
GFR calc non Af Amer: >60 mL/min (ref >60)
Glucose, Bld: 105 mg/dL — ABNORMAL HIGH (ref 70–99)
Potassium: 3.5 meq/L (ref 3.5–5.1)
Sodium: 141 meq/L (ref 135–145)
Total Bilirubin: 1.3 mg/dL — ABNORMAL HIGH (ref 0.3–1.2)

## 2010-09-23 LAB — POCT CARDIAC MARKERS
CKMB, poc: 1 ng/mL (ref 1.0–8.0)
CKMB, poc: <1 ng/mL — ABNORMAL LOW (ref 1.0–8.0)
Myoglobin, poc: 43.8 ng/mL (ref 12–200)
Myoglobin, poc: 59.9 ng/mL (ref 12–200)
Troponin i, poc: <0.05 ng/mL (ref 0.00–0.09)
Troponin i, poc: <0.05 ng/mL (ref 0.00–0.09)

## 2010-09-23 LAB — CBC
HCT: 40.7 % (ref 36.0–46.0)
Hemoglobin: 14.3 g/dL (ref 12.0–15.0)
MCHC: 35.2 g/dL (ref 30.0–36.0)
MCV: 95.6 fL (ref 78.0–100.0)
Platelets: 171 10*3/uL (ref 150–400)
RBC: 4.26 MIL/uL (ref 3.87–5.11)
RDW: 13.4 % (ref 11.5–15.5)
WBC: 3.9 K/uL — ABNORMAL LOW (ref 4.0–10.5)

## 2010-09-23 LAB — COMPREHENSIVE METABOLIC PANEL
Albumin: 4.2 g/dL (ref 3.5–5.2)
BUN: 17 mg/dL (ref 6–23)
Creatinine, Ser: 0.85 mg/dL (ref 0.4–1.2)
Total Protein: 6.8 g/dL (ref 6.0–8.3)

## 2010-09-23 LAB — DIFFERENTIAL
Basophils Absolute: 0 10*3/uL (ref 0.0–0.1)
Basophils Relative: 1 % (ref 0–1)
Eosinophils Absolute: 0.6 K/uL (ref 0.0–0.7)
Eosinophils Relative: 16 % — ABNORMAL HIGH (ref 0–5)
Lymphocytes Relative: 52 % — ABNORMAL HIGH (ref 12–46)
Lymphs Abs: 2 K/uL (ref 0.7–4.0)
Monocytes Absolute: 0.9 10*3/uL (ref 0.1–1.0)
Monocytes Relative: 22 % — ABNORMAL HIGH (ref 3–12)
Neutro Abs: 0.4 10*3/uL — ABNORMAL LOW (ref 1.7–7.7)
Neutrophils Relative %: 9 % — ABNORMAL LOW (ref 43–77)

## 2010-09-23 LAB — APTT: aPTT: 26 seconds (ref 24–37)

## 2010-09-23 LAB — PROTIME-INR
INR: 0.85 (ref 0.00–1.49)
Prothrombin Time: 11.5 seconds — ABNORMAL LOW (ref 11.6–15.2)

## 2010-09-23 LAB — LIPASE, BLOOD: Lipase: 38 U/L (ref 11–59)

## 2010-09-24 LAB — BASIC METABOLIC PANEL
CO2: 28 mEq/L (ref 19–32)
Calcium: 9.6 mg/dL (ref 8.4–10.5)
Sodium: 144 mEq/L (ref 135–145)

## 2010-11-02 NOTE — Procedures (Signed)
[name], [name]               ACCOUNT NO.:  [id number]   MEDICAL RECORD NO.:  [id number]           PATIENT TYPE:  OUT   LOCATION:  SLEEP CENTER                 FACILITY:  [facility]   PHYSICIAN:  Clinton D. Maple Hudson, MD, FCCP, FACPDATE OF BIRTH:  December 01[birth year] 1943   DATE OF STUDY:  03/15/2007                            NOCTURNAL POLYSOMNOGRAM   REFERRING PHYSICIAN:  Hermelinda Medicus, M.D.   PATIENT NAME:  Jamie Villa.   MEDICAL RECORD NUMBER:  [id number].   REFERRING PHYSICIAN:  Hermelinda Medicus, MD.   DATE OF STUDY:  March 15, 2007.   INDICATION FOR STUDY:  Hypersomnia with sleep apnea.   EPWORTH SLEEPINESS SCORE:  2/24, BMI 24.9, weight 150 pounds.   HOME MEDICATIONS:  Listed and reviewed.   SLEEP ARCHITECTURE:  Total sleep time 300 minutes with sleep efficiency  79%.  Stage 1 was 8%, stage 2 76%, stage 3 1%, REM 16% of total sleep  time.  Sleep latency 44 minutes, REM latency 19 minutes, awake after  sleep onset 40 minutes, arousal index 8.6.  No bedtime medication was  taken.   RESPIRATORY DATA:  Apnea/hypopnea index (AHI, RDI) is 0.4 obstructive  events per hour (normal range 0 to 5 per hour).   OXYGEN DATA:  Moderate snoring with oral venting.  Oxygen desaturation  to a nadir of 89%.  Mean oxygen saturation through the study was 94% on  room air.   CARDIAC DATA:  Normal sinus rhythm with occasional PVCs.   MOVEMENT/PARASOMNIA:  Frequent limb jerks but with little effect on  sleep.   IMPRESSION/RECOMMENDATION:  1. Sleep architecture was marked by frequent, brief wakings.  2. Very occasional respiratory disturbance, apnea/hypopnea index 0.4      per hour (normal range 0 to 5 per hour).      The events were non-positional.  Moderate snoring and oral venting      with oxygen desaturation to a nadir of 89%.      Clinton D. Maple Hudson, MD, FCCP, FACP  Diplomate, Biomedical engineer of Sleep Medicine  Electronically Signed     CDY/MEDQ  D:  03/18/2007 15:09:05  T:  03/18/2007  15:40:48  Job:  [id number]

## 2010-12-08 IMAGING — CR DG CHEST 2V
2 series · 2 of 2 positions shown · non-contrast
Comparison: Chest radiograph performed 07/08/2008, and CTA of the
chest performed 04/07/2009

CLINICAL DATA: Fever and weakness; history of lymphoma.

CHEST - 2 VIEW

[w chest pa]
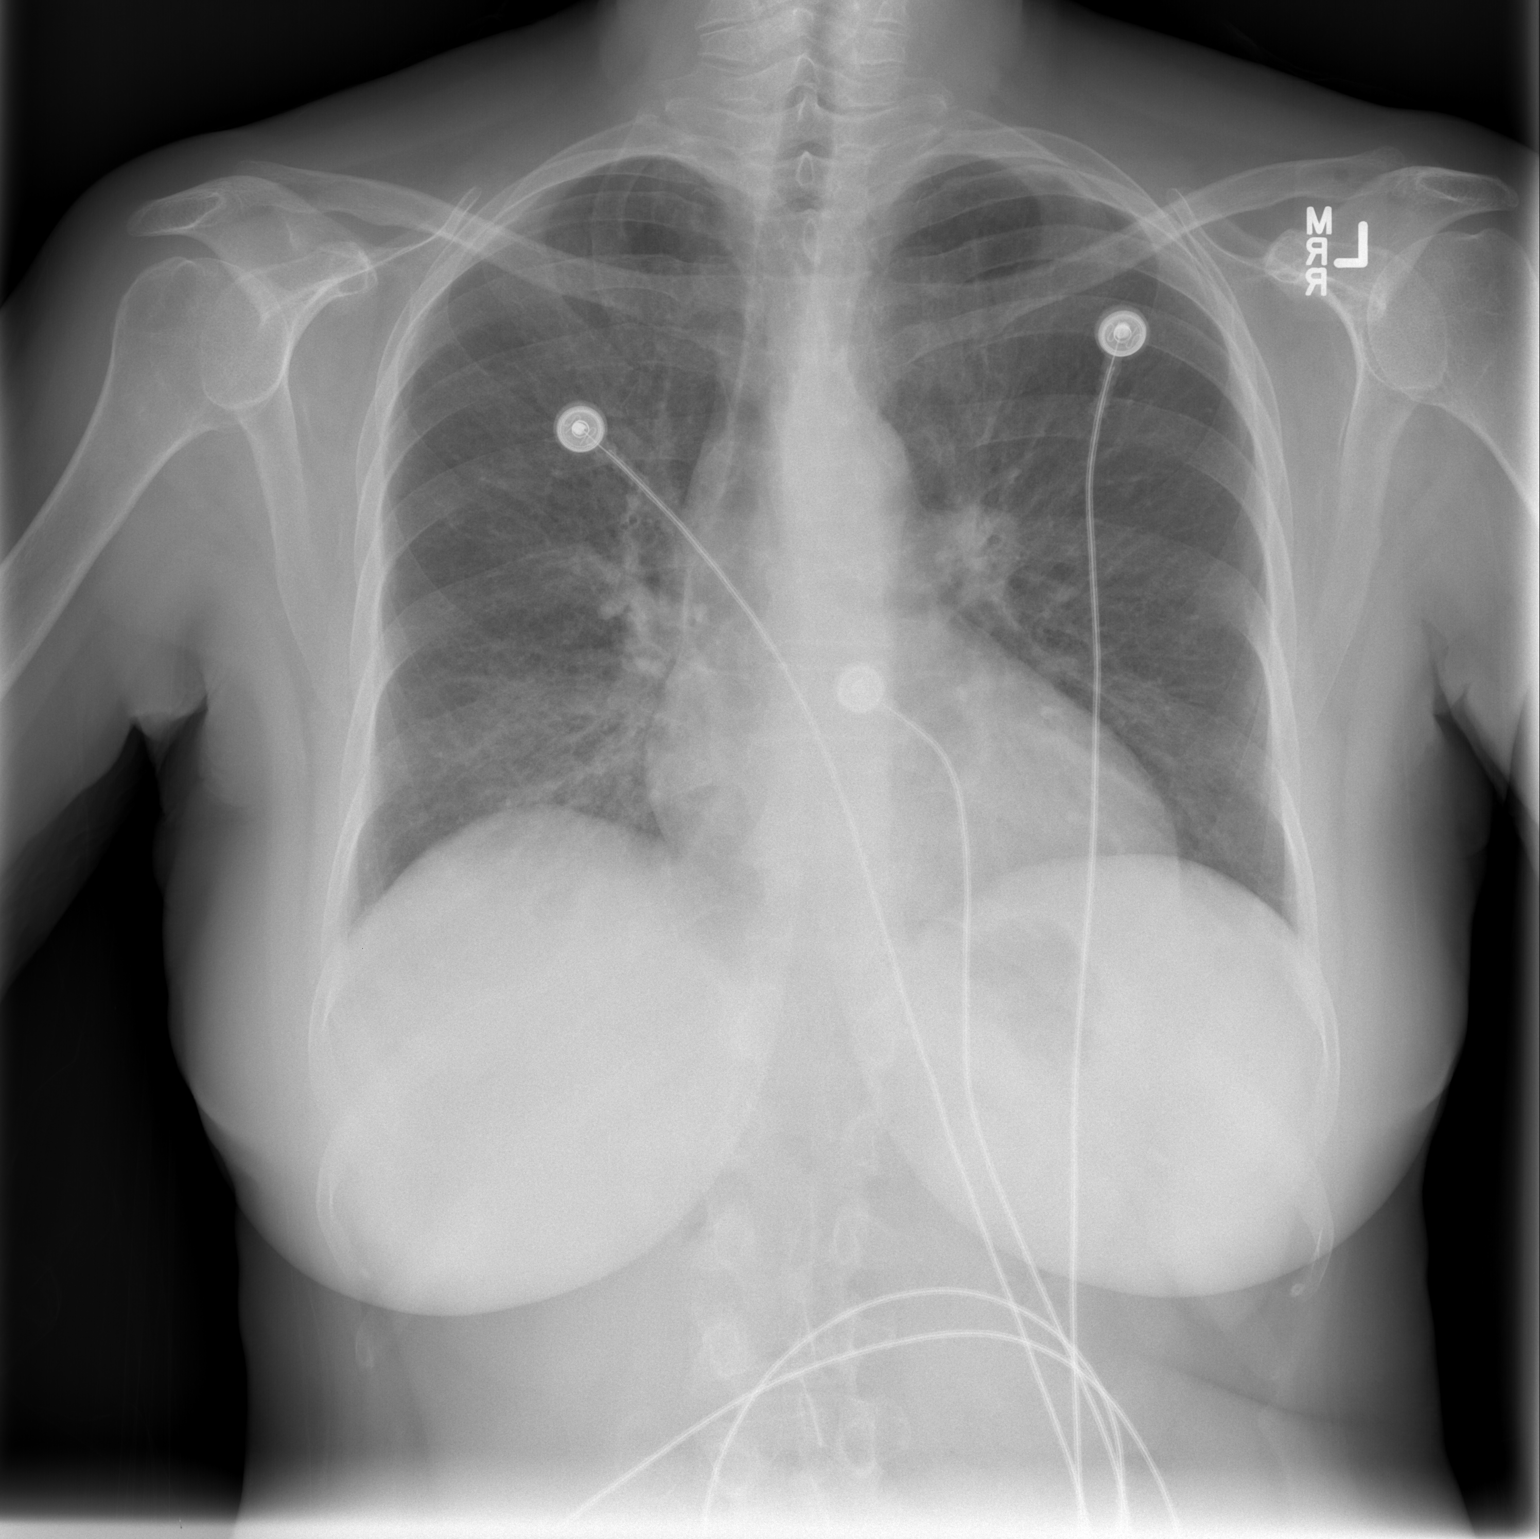

[w chest lat]
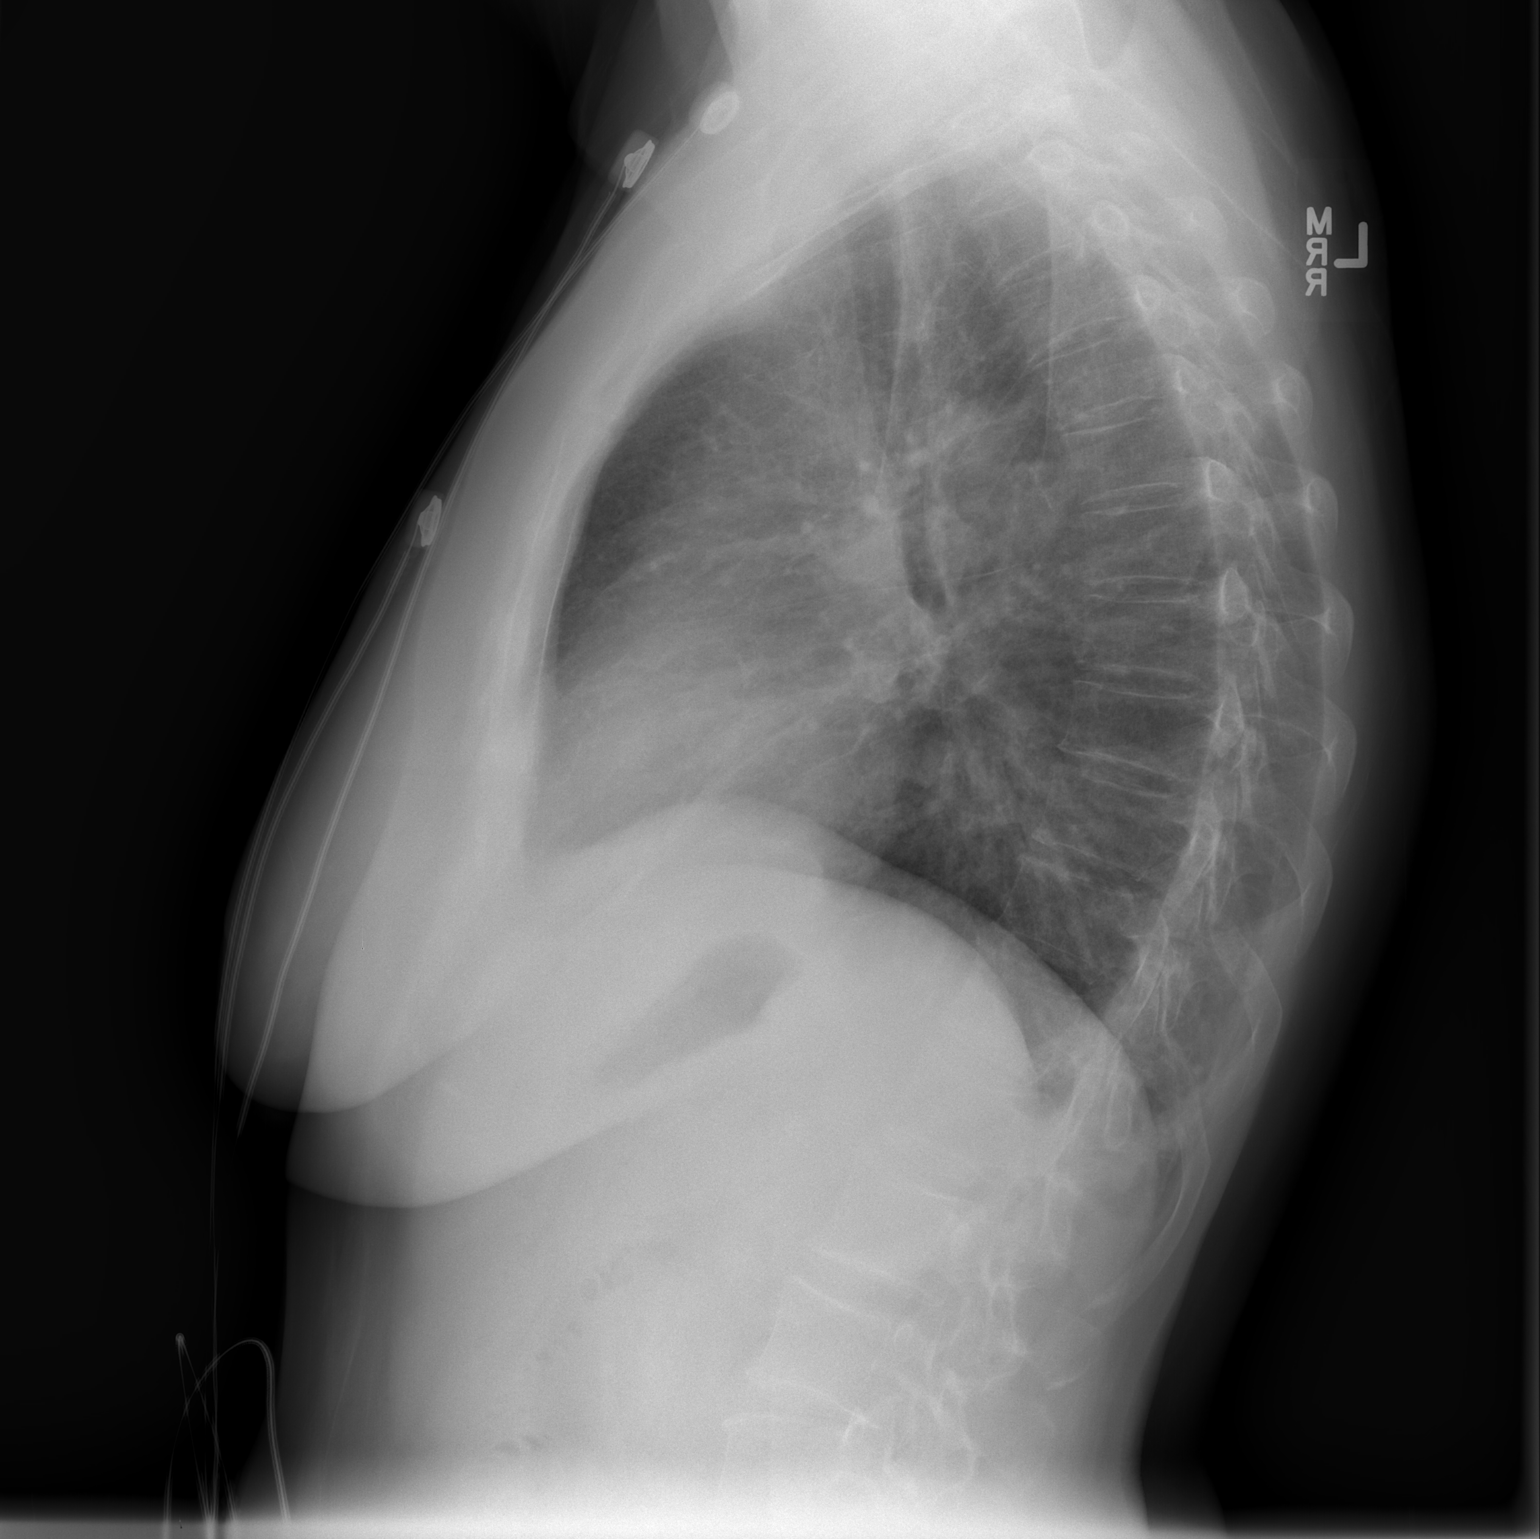

[2 of 2 positions shown; findings below may reference images not displayed]

FINDINGS: The lungs are well-aerated.  There is vague right basilar
airspace opacity, not well characterized on the lateral view but
raising concern for pneumonia.  There is no evidence of pleural
effusion or pneumothorax. Stable peribronchial thickening is noted.

The heart is normal in size; the mediastinal contour is within
normal limits.  No acute osseous abnormalities are seen.
IMPRESSION: Vague right basilar airspace opacity raises concern for pneumonia.

## 2010-12-09 IMAGING — CR DG CHEST 2V
2 series · 2 of 2 positions shown · non-contrast
Comparison: 12/11/2009

CLINICAL DATA: Lymphoma, fever, status post transplant

CHEST - 2 VIEW

[w chest pa]
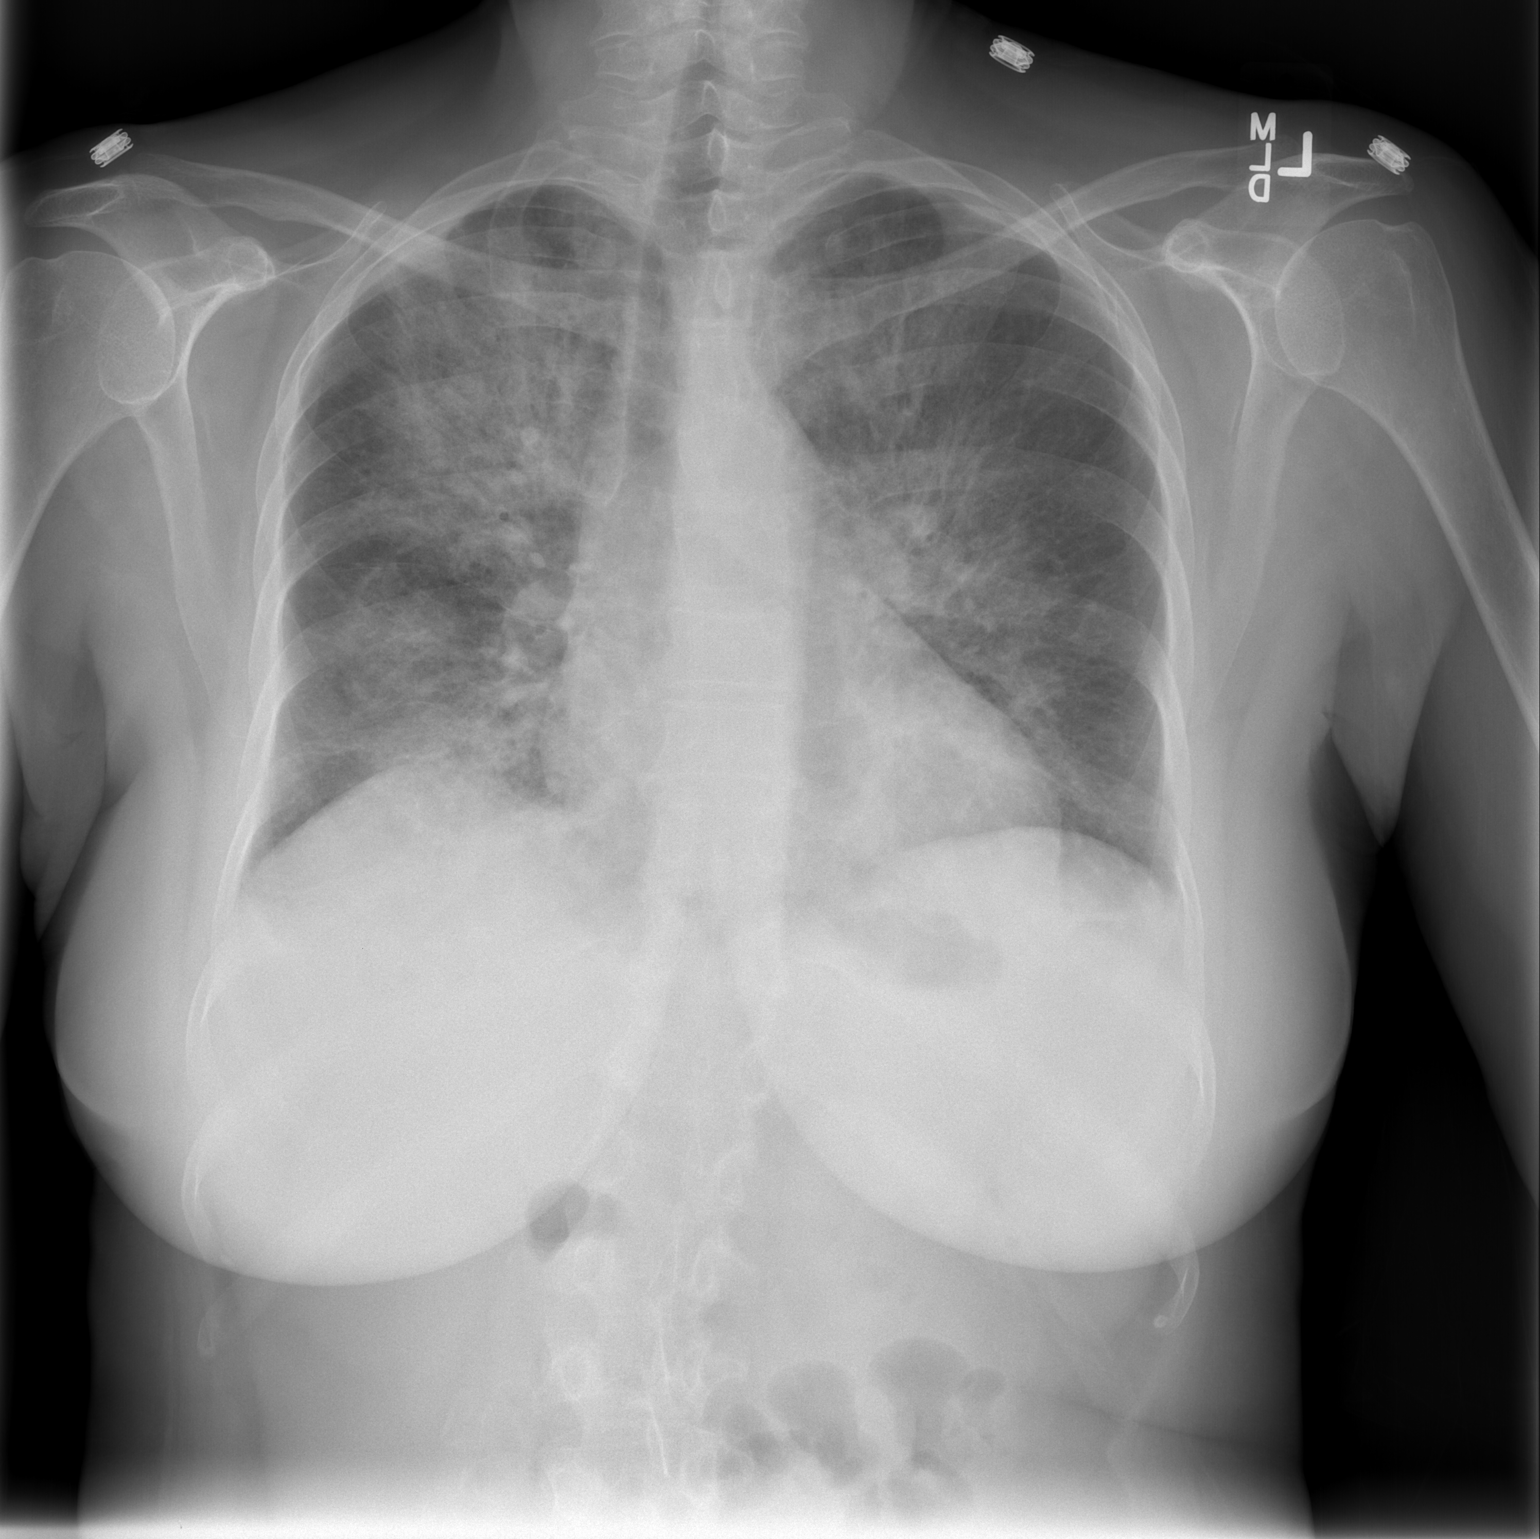

[w chest lat]
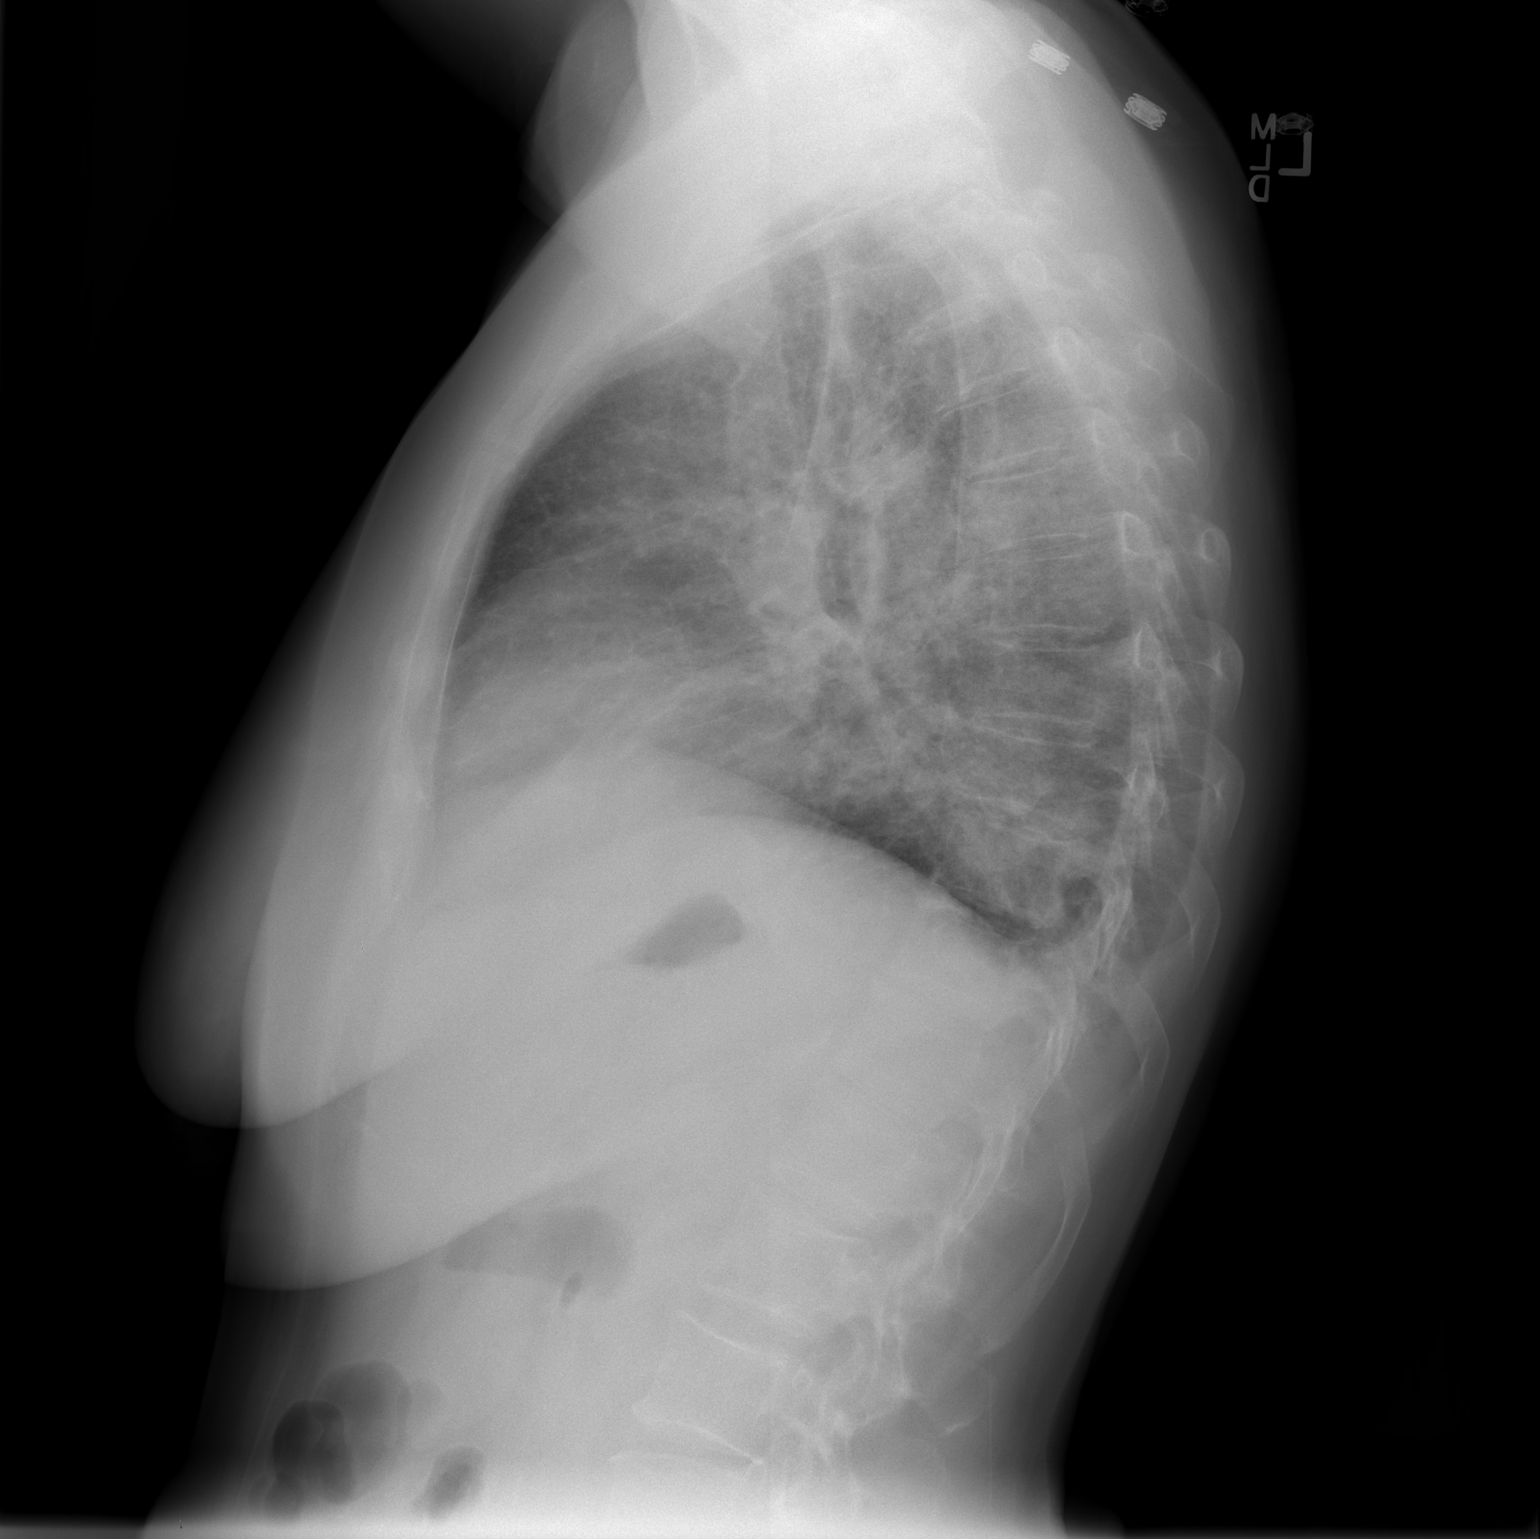

[2 of 2 positions shown; findings below may reference images not displayed]

FINDINGS: Acute central and perihilar airspace pattern noted
consistent with acute edema, less likely pneumonia or pulmonary
hemorrhage.  Normal heart size and vascularity.  No effusion or
pneumothorax.
IMPRESSION: Acute alveolar edema pattern, as described.

## 2011-01-24 ENCOUNTER — Other Ambulatory Visit: Payer: Self-pay | Admitting: Oncology

## 2011-01-24 ENCOUNTER — Encounter (HOSPITAL_BASED_OUTPATIENT_CLINIC_OR_DEPARTMENT_OTHER): Payer: Medicare Other | Admitting: Oncology

## 2011-01-24 DIAGNOSIS — C8589 Other specified types of non-Hodgkin lymphoma, extranodal and solid organ sites: Secondary | ICD-10-CM

## 2011-01-24 LAB — CBC WITH DIFFERENTIAL/PLATELET
Basophils Absolute: 0 10*3/uL (ref 0.0–0.1)
Eosinophils Absolute: 0 10*3/uL (ref 0.0–0.5)
HGB: 12.4 g/dL (ref 11.6–15.9)
MCV: 98.6 fL (ref 79.5–101.0)
MONO#: 0.6 10*3/uL (ref 0.1–0.9)
MONO%: 11.9 % (ref 0.0–14.0)
NEUT#: 2.2 10*3/uL (ref 1.5–6.5)
RBC: 3.52 10*6/uL — ABNORMAL LOW (ref 3.70–5.45)
RDW: 12.9 % (ref 11.2–14.5)
WBC: 4.9 10*3/uL (ref 3.9–10.3)
lymph#: 2 10*3/uL (ref 0.9–3.3)

## 2011-02-12 ENCOUNTER — Emergency Department (HOSPITAL_COMMUNITY)
Admission: EM | Admit: 2011-02-12 | Discharge: 2011-02-12 | Disposition: A | Discharge status: Home / Self Care | Payer: Medicare Other | Attending: Emergency Medicine | Admitting: Emergency Medicine

## 2011-02-12 DIAGNOSIS — B029 Zoster without complications: Secondary | ICD-10-CM | POA: Insufficient documentation

## 2011-02-12 DIAGNOSIS — I10 Essential (primary) hypertension: Secondary | ICD-10-CM | POA: Insufficient documentation

## 2011-02-12 DIAGNOSIS — R079 Chest pain, unspecified: Secondary | ICD-10-CM | POA: Insufficient documentation

## 2011-02-12 DIAGNOSIS — Z79899 Other long term (current) drug therapy: Secondary | ICD-10-CM | POA: Insufficient documentation

## 2011-02-12 DIAGNOSIS — Z87898 Personal history of other specified conditions: Secondary | ICD-10-CM | POA: Insufficient documentation

## 2011-02-14 ENCOUNTER — Telehealth: Payer: Self-pay

## 2011-02-14 NOTE — Telephone Encounter (Signed)
[facility] Triage Call Report Triage Record Num: [id number] Operator: Martie Lee Long Patient Name: Jamie Villa Call Date & Time: 02/12/2011 9:40:21AM Patient Phone: [phone] PCP: [facility] Patient Gender: Female PCP Fax : [phone] Patient DOB: 20-Dec-1945 Practice Name: [facility] Reason for Call: Dottie/Patien calling aboout to her left chest. Pain is described as a crampy like pain. Onset 02/12/11. Pt reports that pain woke her up this am. Instructed to call 911. Protocol(s) Used: Chest Pain Recommended Outcome per Protocol: Activate EMS 911 Reason for Outcome: Pressure, fullness, squeezing sensation or pain anywhere in the chest lasting 5 or more minutes now or within the last hour. Pain is NOT associated with taking a deep breath or a productive cough, movement, or touch to a localized area on the chest. Care Advice: ~ IMMEDIATE ACTION 02/12/2011 9:50:29AM Page 1 of 1 [id number]

## 2011-02-14 NOTE — Telephone Encounter (Signed)
[facility] Triage Call Report Triage Record Num: [id number] Operator: Tomasita Crumble Patient Name: Jamie Villa Call Date & Time: 02/12/2011 7:59:55AM Patient Phone: [phone] PCP: [place] Patient Gender: Female PCP Fax : [phone] Patient DOB: [birth year] Practice Name: [facility] Reason for Call: Pt. calling about pain in left side. Reports hx of gallstones. Onset 8/23. Pain rated at 5-7 of 10, intermittent. States has not done anything for the pain. Pain described "like a cramp"; duration "maybe a minute." Advised see in 24 hours per nursing judgement and Abdominal Pain protocol. Protocol(s) Used: Abdominal Pain Recommended Outcome per Protocol: See Provider within 2 Weeks Override Outcome if Used in Protocol: See Provider within 24 hours RN Reason for Override Outcome: Nursing Judgement Used. Reason for Outcome: Repeated episodes of abdominal discomfort AND no previous evaluation by provider Care Advice: Avoid foods that may be responsible for increased gas production (such as dairy products, raw vegetables, nuts, spicy or fried foods, etc.). ~ ~ Eliminate or decrease the intake of alcoholic or caffeinated beverages. ~ Pain medication or laxatives should not be taken until symptoms are evaluated. ~ Eat smaller, more frequent meals; eat slowly and allow one-half hour to relax after eating. ~ SYMPTOM / CONDITION MANAGEMENT 02/12/2011 8:15:21AM Page 1 of 1 [email or web address]

## 2011-03-25 LAB — CBC
HCT: 44.4 % (ref 36.0–46.0)
Hemoglobin: 15 g/dL (ref 12.0–15.0)
Platelets: 202 10*3/uL (ref 150–400)
WBC: 4.3 10*3/uL (ref 4.0–10.5)

## 2011-03-25 LAB — DIFFERENTIAL
Eosinophils Relative: 1 % (ref 0–5)
Lymphocytes Relative: 34 % (ref 12–46)
Lymphs Abs: 1.5 10*3/uL (ref 0.7–4.0)
Monocytes Absolute: 0.4 10*3/uL (ref 0.1–1.0)

## 2011-03-25 LAB — BONE MARROW EXAM

## 2011-03-26 ENCOUNTER — Other Ambulatory Visit: Payer: Self-pay | Admitting: Cardiology

## 2011-03-28 ENCOUNTER — Other Ambulatory Visit: Payer: Self-pay

## 2011-03-28 MED ORDER — LOSARTAN POTASSIUM-HCTZ 50-12.5 MG PO TABS: 1.0000 | ORAL_TABLET | Freq: Every day | ORAL | Status: DC

## 2011-04-01 ENCOUNTER — Encounter: Payer: Self-pay | Admitting: Internal Medicine

## 2011-04-07 ENCOUNTER — Encounter: Payer: Self-pay | Admitting: Internal Medicine

## 2011-04-07 ENCOUNTER — Ambulatory Visit (INDEPENDENT_AMBULATORY_CARE_PROVIDER_SITE_OTHER): Payer: Medicare Other | Admitting: Internal Medicine

## 2011-04-07 VITALS — BP 136/70 | HR 73 | Temp 97.8°F | Resp 16 | Wt 165.0 lb

## 2011-04-07 DIAGNOSIS — I10 Essential (primary) hypertension: Secondary | ICD-10-CM

## 2011-04-07 MED ORDER — LOSARTAN POTASSIUM-HCTZ 50-12.5 MG PO TABS: 1.0000 | ORAL_TABLET | Freq: Every day | ORAL | Status: DC

## 2011-04-07 MED ORDER — NEBIVOLOL HCL 5 MG PO TABS: 5.0000 mg | ORAL_TABLET | Freq: Every day | ORAL | Status: DC

## 2011-04-07 NOTE — Assessment & Plan Note (Signed)
Her BP is well controlled, continue current meds

## 2011-04-07 NOTE — Progress Notes (Signed)
  Subjective:    Patient ID: Jamie Villa, female    DOB: 02-Apr-1946, 65 y.o.   MRN: [id number]  Hypertension This is a chronic problem. The current episode started more than 1 year ago. The problem has been gradually improving since onset. The problem is controlled. Pertinent negatives include no anxiety, blurred vision, chest pain, headaches, malaise/fatigue, neck pain, orthopnea, palpitations, peripheral edema, PND, shortness of breath or sweats. Past treatments include beta blockers, angiotensin blockers and diuretics. The current treatment provides significant improvement. There are no compliance problems.       Review of Systems  Constitutional: Negative.  Negative for malaise/fatigue.  HENT: Negative.  Negative for neck pain.   Eyes: Negative.  Negative for blurred vision.  Respiratory: Negative.  Negative for shortness of breath.   Cardiovascular: Negative.  Negative for chest pain, palpitations, orthopnea and PND.  Gastrointestinal: Negative.   Musculoskeletal: Negative.   Skin: Negative.   Neurological: Negative.  Negative for headaches.  Hematological: Negative.   Psychiatric/Behavioral: Negative.        Objective:   Physical Exam  Vitals reviewed. Constitutional: She is oriented to person, place, and time. She appears well-developed and well-nourished. No distress.  HENT:  Head: Normocephalic and atraumatic.  Mouth/Throat: Oropharynx is clear and moist. No oropharyngeal exudate.  Eyes: Conjunctivae are normal. Right eye exhibits no discharge. Left eye exhibits no discharge. No scleral icterus.  Neck: Normal range of motion. Neck supple. No JVD present. No tracheal deviation present. No thyromegaly present.  Cardiovascular: Normal rate, regular rhythm, normal heart sounds and intact distal pulses.  Exam reveals no gallop and no friction rub.   No murmur heard. Pulmonary/Chest: Effort normal and breath sounds normal. No stridor. No respiratory distress. She has no  wheezes. She has no rales. She exhibits no tenderness.  Abdominal: Soft. Bowel sounds are normal. She exhibits no distension and no mass. There is no tenderness. There is no rebound and no guarding.  Musculoskeletal: Normal range of motion. She exhibits no edema and no tenderness.  Lymphadenopathy:    She has no cervical adenopathy.  Neurological: She is oriented to person, place, and time. She displays normal reflexes. She exhibits normal muscle tone. Coordination normal.  Skin: Skin is warm and dry. No rash noted. She is not diaphoretic. No erythema. No pallor.  Psychiatric: She has a normal mood and affect. Her behavior is normal. Judgment and thought content normal.      Lab Results  Component Value Date   WBC 6.7 12/15/2009   HGB 12.4 01/24/2011   HCT 34.7* 01/24/2011   PLT 154 01/24/2011   GLUCOSE 105* 09/23/2010   ALT 31 12/17/2009   AST 56* 12/17/2009   NA 144 09/23/2010   K 4.0 09/23/2010   CL 107 09/23/2010   CREATININE 0.90 09/23/2010   BUN 22 09/23/2010   CO2 28 09/23/2010   INR 0.90 10/14/2009      Assessment & Plan:

## 2011-04-07 NOTE — Patient Instructions (Signed)

## 2011-04-28 ENCOUNTER — Other Ambulatory Visit: Payer: Medicare Other | Admitting: Lab

## 2011-04-28 ENCOUNTER — Ambulatory Visit: Payer: Medicare Other

## 2011-05-05 ENCOUNTER — Ambulatory Visit: Payer: Medicare Other

## 2011-05-05 ENCOUNTER — Other Ambulatory Visit: Payer: Medicare Other | Admitting: Lab

## 2011-05-06 LAB — HM DEXA SCAN: HM Dexa Scan: NORMAL

## 2011-05-13 ENCOUNTER — Ambulatory Visit: Payer: Medicare Other

## 2011-05-13 ENCOUNTER — Other Ambulatory Visit: Payer: Medicare Other | Admitting: Lab

## 2011-05-16 ENCOUNTER — Other Ambulatory Visit: Payer: Self-pay | Admitting: Nurse Practitioner

## 2011-05-16 ENCOUNTER — Ambulatory Visit (HOSPITAL_BASED_OUTPATIENT_CLINIC_OR_DEPARTMENT_OTHER): Payer: Medicare Other

## 2011-05-16 DIAGNOSIS — Z23 Encounter for immunization: Secondary | ICD-10-CM

## 2011-05-16 DIAGNOSIS — C8590 Non-Hodgkin lymphoma, unspecified, unspecified site: Secondary | ICD-10-CM

## 2011-05-16 MED ORDER — INFLUENZA VIRUS VACC SPLIT PF IM SUSP
0.5000 mL | Freq: Once | INTRAMUSCULAR | Status: DC
  Administered 2011-05-16: 0.5 mL via INTRAMUSCULAR

## 2011-05-16 NOTE — Patient Instructions (Signed)
Call MD for problems.  Fl[employer] information read by patient and consent signed.   Information sheet given to patient to take home.

## 2011-05-19 ENCOUNTER — Ambulatory Visit: Payer: Medicare Other | Admitting: Oncology

## 2011-05-19 ENCOUNTER — Other Ambulatory Visit: Payer: Medicare Other | Admitting: Lab

## 2011-05-26 ENCOUNTER — Other Ambulatory Visit: Payer: Medicare Other

## 2011-05-26 ENCOUNTER — Ambulatory Visit (HOSPITAL_BASED_OUTPATIENT_CLINIC_OR_DEPARTMENT_OTHER): Payer: Medicare Other | Admitting: Oncology

## 2011-05-26 VITALS — BP 131/77 | HR 63 | Temp 97.5°F | Ht 65.0 in | Wt 166.3 lb

## 2011-05-26 DIAGNOSIS — C8588 Other specified types of non-Hodgkin lymphoma, lymph nodes of multiple sites: Secondary | ICD-10-CM

## 2011-05-26 DIAGNOSIS — C8589 Other specified types of non-Hodgkin lymphoma, extranodal and solid organ sites: Secondary | ICD-10-CM

## 2011-05-26 DIAGNOSIS — Z23 Encounter for immunization: Secondary | ICD-10-CM

## 2011-05-26 DIAGNOSIS — B0229 Other postherpetic nervous system involvement: Secondary | ICD-10-CM

## 2011-05-26 NOTE — Progress Notes (Signed)
OFFICE PROGRESS NOTE   INTERVAL HISTORY:   [name] returns as scheduled. She feels well. She denies fever, night sweats, anorexia, and there are no palpable lymph nodes. She continues to work. She reports intermittent discomfort at the left anterolateral chest wall and medial aspect of the left arm. The pain is not severe. She is taking prophylactic acyclovir.  Objective:  Vital signs in last 24 hours:  Blood pressure 131/77, pulse 63, temperature 97.5 F (36.4 C), temperature source Oral, height 5\' 5"  (1.651 m), weight 166 lb 4.8 oz (75.433 kg).    HEENT: Neck without mass Lymphatics: No cervical, supraclavicular, axillary, or inguinal lymph nodes Resp: Lungs clear bilaterally Cardio: Regular rate and rhythm GI: Abdomen nontender. No hepatosplenomegaly Vascular: No leg edema  Skin: Minimal residual  zoster rash at the left posterior lateral chest     Lab Results:  CBC  Lab Results  Component Value Date   WBC 4.9 01/24/2011   HGB 12.4 01/24/2011   HCT 34.7* 01/24/2011   MCV 98.6 01/24/2011   PLT 154 01/24/2011    Medications: I have reviewed the patient's current medications.  Assessment/Plan: 1. 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  She completed six cycles of CHOP/rituximab from May 30, 2008 through September 17, 2008.  A restaging PET scan on September 09, 2008 showed improvement in the lymphadenopathy and resolution of abnormal hypermetabolic activity.  A new left submandibular mass was noted in November 2010, status post two nondiagnostic FNA biopsies and core biopsies.  An incisional biopsy on June 02, 2009 confirmed involvement with a B-cell non-Hodgkin's lymphoma, most consistent with a low-grade lymphoma.  A staging PET scan on June 09, 2009 confirmed hypermetabolic lymphadenopathy in the left neck, right axilla, right neck,  retroperitoneum, and pelvis.   a. Status post three cycles of salvage therapy with bendamustine/Rituxan with cycle #3 initiated on August 20, 2009.  The left submandibular mass resolved.  A restaging PET scan on September 08, 2009 revealed a complete metabolic response and a marked reduction in the neck, axillary, and retroperitoneal lymphadenopathy. b. Initiation of stem cell conditioning regimen with Rituxan and Cytoxan on September 16, 2009. c. Stem cell collection on October 05, 2009 through October 06, 2009. d. Rituximab on October 14, 2009. e. Combination chemotherapy with BEAM beginning on October 16, 2009 with the stem cell infusion on Oct 22, 2009. 2. History of mild leukopenia and thrombocytopenia on January 20, 2009, likely a nadir related to Rituxan.       3. Herpes zoster rash at the left posterolateral chest wall in August 2012, maintained on prophylactic acyclovir. She appears to have mild postherpetic neuralgia 4. Admission with acute renal failure following the conditioning chemotherapy, resolved. 5. Admission with "pneumonia" on December 11, 2009.  Cultures of the blood and urine were negative.  A chest x-ray suggested acute lung injury in addition to lower lung pneumonia.  The chest x-ray and her symptoms improved with antibiotics.  She completed outpatient Levaquin.  The chest x-ray on December 24, 2009 showed further improvement.     Disposition:  She remains in clinical remission from non-Hodgkin's lymphoma. She will return for an office and lab visit in 3 months. She received an influenza vaccine. She will continue prophylactic acyclovir. She will contact us if the postherpetic neuralgia worsens.   Lucile Shutters, MD  05/26/2011  1:27 PM

## 2011-05-27 ENCOUNTER — Telehealth: Payer: Self-pay | Admitting: Oncology

## 2011-05-27 NOTE — Telephone Encounter (Signed)
lmonvm adviisng the pt of her march 2013 appt

## 2011-05-30 ENCOUNTER — Ambulatory Visit (INDEPENDENT_AMBULATORY_CARE_PROVIDER_SITE_OTHER)
Admission: RE | Admit: 2011-05-30 | Discharge: 2011-05-30 | Disposition: A | Discharge status: Home / Self Care | Payer: Medicare Other | Source: Ambulatory Visit | Attending: Internal Medicine | Admitting: Internal Medicine

## 2011-05-30 ENCOUNTER — Telehealth: Payer: Self-pay | Admitting: Internal Medicine

## 2011-05-30 ENCOUNTER — Ambulatory Visit (INDEPENDENT_AMBULATORY_CARE_PROVIDER_SITE_OTHER): Payer: Medicare Other | Admitting: Internal Medicine

## 2011-05-30 ENCOUNTER — Encounter: Payer: Self-pay | Admitting: Internal Medicine

## 2011-05-30 VITALS — BP 118/70 | HR 86 | Temp 99.4°F | Resp 16 | Wt 160.0 lb

## 2011-05-30 DIAGNOSIS — J209 Acute bronchitis, unspecified: Secondary | ICD-10-CM | POA: Insufficient documentation

## 2011-05-30 DIAGNOSIS — R059 Cough, unspecified: Secondary | ICD-10-CM | POA: Insufficient documentation

## 2011-05-30 DIAGNOSIS — R05 Cough: Secondary | ICD-10-CM

## 2011-05-30 DIAGNOSIS — I10 Essential (primary) hypertension: Secondary | ICD-10-CM

## 2011-05-30 MED ORDER — AZITHROMYCIN 500 MG PO TABS: 500.0000 mg | ORAL_TABLET | Freq: Every day | ORAL | Status: DC

## 2011-05-30 MED ORDER — GUAIFENESIN-CODEINE 100-10 MG/5ML PO SYRP: 5.0000 mL | ORAL_SOLUTION | Freq: Three times a day (TID) | ORAL | Status: DC | PRN

## 2011-05-30 MED ORDER — AZITHROMYCIN 500 MG PO TABS: 500.0000 mg | ORAL_TABLET | Freq: Every day | ORAL | Status: AC

## 2011-05-30 MED ORDER — PROMETHAZINE-DM 6.25-15 MG/5ML PO SYRP: 5.0000 mL | ORAL_SOLUTION | Freq: Four times a day (QID) | ORAL | Status: AC | PRN

## 2011-05-30 NOTE — Patient Instructions (Signed)

## 2011-05-30 NOTE — Telephone Encounter (Signed)
The [facility] on [address] called and stated they need a replacement rx for the patient.  She said she is allergic to Codeine and needs a different rx.  The pharmacy technician was Tresa Endo at the [facility] on [address].    Thanks!

## 2011-05-30 NOTE — Assessment & Plan Note (Signed)
I will check her CXR today to look for PNA

## 2011-05-30 NOTE — Assessment & Plan Note (Signed)
Her BP is well controlled 

## 2011-05-30 NOTE — Progress Notes (Signed)
  Subjective:    Patient ID: Jamie Villa, female    DOB: 24-Sep-1945, 65 y.o.   MRN: [id number]  Cough This is a new problem. Episode onset: 3 days ago. The problem has been gradually worsening. The cough is productive of sputum. Associated symptoms include chills, a fever, headaches, myalgias, rhinorrhea, a sore throat and sweats. Pertinent negatives include no chest pain, ear congestion, ear pain, heartburn, hemoptysis, nasal congestion, postnasal drip, rash, shortness of breath, weight loss or wheezing. She has tried OTC cough suppressant for the symptoms. The treatment provided no relief. Her past medical history is significant for pneumonia.      Review of Systems  Constitutional: Positive for fever and chills. Negative for weight loss, diaphoresis, activity change, appetite change, fatigue and unexpected weight change.  HENT: Positive for sore throat and rhinorrhea. Negative for ear pain, facial swelling, sneezing, trouble swallowing, neck pain, neck stiffness, voice change, postnasal drip and sinus pressure.   Eyes: Negative.   Respiratory: Negative for cough, hemoptysis, shortness of breath, wheezing and stridor.   Cardiovascular: Negative for chest pain.  Gastrointestinal: Negative for heartburn, nausea, vomiting, abdominal pain, diarrhea, constipation, blood in stool and abdominal distention.  Genitourinary: Negative.   Musculoskeletal: Positive for myalgias. Negative for back pain, joint swelling, arthralgias and gait problem.  Skin: Negative for color change, pallor, rash and wound.  Neurological: Positive for headaches. Negative for dizziness, tremors, seizures, syncope, facial asymmetry, speech difficulty, weakness, light-headedness and numbness.  Hematological: Negative for adenopathy. Does not bruise/bleed easily.  Psychiatric/Behavioral: Negative.        Objective:   Physical Exam  Vitals reviewed. Constitutional: She is oriented to person, place, and time. She appears  well-developed and well-nourished. No distress.  HENT:  Head: Normocephalic and atraumatic.  Mouth/Throat: Oropharynx is clear and moist. No oropharyngeal exudate.  Eyes: Conjunctivae are normal. Right eye exhibits no discharge. Left eye exhibits no discharge. No scleral icterus.  Neck: Normal range of motion. Neck supple. No JVD present. No tracheal deviation present. No thyromegaly present.  Cardiovascular: Normal rate, regular rhythm, normal heart sounds and intact distal pulses.  Exam reveals no gallop and no friction rub.   No murmur heard. Pulmonary/Chest: Effort normal and breath sounds normal. No stridor. No respiratory distress. She has no wheezes. She has no rales. She exhibits no tenderness.  Abdominal: Soft. Bowel sounds are normal. She exhibits no distension and no mass. There is no tenderness. There is no rebound and no guarding.  Musculoskeletal: Normal range of motion. She exhibits no edema and no tenderness.  Lymphadenopathy:    She has no cervical adenopathy.  Neurological: She is oriented to person, place, and time.  Skin: Skin is warm and dry. No rash noted. She is not diaphoretic. No erythema. No pallor.  Psychiatric: She has a normal mood and affect. Her behavior is normal. Judgment and thought content normal.      Lab Results  Component Value Date   WBC 4.9 01/24/2011   HGB 12.4 01/24/2011   HCT 34.7* 01/24/2011   PLT 154 01/24/2011   GLUCOSE 105* 09/23/2010   ALT 31 12/17/2009   AST 56* 12/17/2009   NA 144 09/23/2010   K 4.0 09/23/2010   CL 107 09/23/2010   CREATININE 0.90 09/23/2010   BUN 22 09/23/2010   CO2 28 09/23/2010   INR 0.90 10/14/2009      Assessment & Plan:

## 2011-05-30 NOTE — Assessment & Plan Note (Signed)
Start zpak for the infection and cough suppressant as well

## 2011-05-30 NOTE — Progress Notes (Signed)
Addended by: Etta Grandchild on: 05/30/2011 04:40 PM   Modules accepted: Orders

## 2011-05-31 ENCOUNTER — Telehealth: Payer: Self-pay

## 2011-05-31 NOTE — Telephone Encounter (Signed)
[facility] advising normal CXR per MD

## 2011-06-06 LAB — HM MAMMOGRAPHY: HM Mammogram: NORMAL

## 2011-09-01 ENCOUNTER — Other Ambulatory Visit: Payer: Self-pay | Admitting: *Deleted

## 2011-09-01 ENCOUNTER — Ambulatory Visit: Payer: Medicare Other | Admitting: Oncology

## 2011-09-01 ENCOUNTER — Telehealth: Payer: Self-pay | Admitting: Oncology

## 2011-09-01 NOTE — Telephone Encounter (Signed)
called pts to r/s missedf appt for 03/14 and pt stated she will rtn call to r/s appt

## 2011-09-22 ENCOUNTER — Ambulatory Visit (INDEPENDENT_AMBULATORY_CARE_PROVIDER_SITE_OTHER): Payer: Medicare Other | Admitting: Psychiatry

## 2011-09-22 DIAGNOSIS — F0630 Mood disorder due to known physiological condition, unspecified: Secondary | ICD-10-CM | POA: Diagnosis not present

## 2011-09-22 NOTE — Progress Notes (Signed)
09-22-2011  Patient seen for initial psychological evaluation.  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  She is also very concerned about her husband and his bout with cancer/treatment.  Gave her several coping skills handouts and recommended she and her husband return for conjoint counseling.

## 2011-10-06 ENCOUNTER — Ambulatory Visit: Payer: Medicare Other | Admitting: Internal Medicine

## 2011-10-06 DIAGNOSIS — Z0289 Encounter for other administrative examinations: Secondary | ICD-10-CM

## 2011-11-09 DIAGNOSIS — Z961 Presence of intraocular lens: Secondary | ICD-10-CM | POA: Diagnosis not present

## 2011-11-09 DIAGNOSIS — H16149 Punctate keratitis, unspecified eye: Secondary | ICD-10-CM | POA: Diagnosis not present

## 2011-11-09 DIAGNOSIS — H40019 Open angle with borderline findings, low risk, unspecified eye: Secondary | ICD-10-CM | POA: Diagnosis not present

## 2011-11-09 DIAGNOSIS — H01009 Unspecified blepharitis unspecified eye, unspecified eyelid: Secondary | ICD-10-CM | POA: Diagnosis not present

## 2012-01-18 DIAGNOSIS — Z79899 Other long term (current) drug therapy: Secondary | ICD-10-CM | POA: Diagnosis not present

## 2012-01-18 DIAGNOSIS — J3489 Other specified disorders of nose and nasal sinuses: Secondary | ICD-10-CM | POA: Diagnosis not present

## 2012-01-18 DIAGNOSIS — Z9481 Bone marrow transplant status: Secondary | ICD-10-CM | POA: Insufficient documentation

## 2012-01-18 DIAGNOSIS — C8589 Other specified types of non-Hodgkin lymphoma, extranodal and solid organ sites: Secondary | ICD-10-CM | POA: Diagnosis not present

## 2012-01-18 DIAGNOSIS — Z8619 Personal history of other infectious and parasitic diseases: Secondary | ICD-10-CM | POA: Diagnosis not present

## 2012-01-18 DIAGNOSIS — C8590 Non-Hodgkin lymphoma, unspecified, unspecified site: Secondary | ICD-10-CM | POA: Insufficient documentation

## 2012-02-24 ENCOUNTER — Encounter: Payer: Self-pay | Admitting: Oncology

## 2012-04-03 ENCOUNTER — Ambulatory Visit (HOSPITAL_BASED_OUTPATIENT_CLINIC_OR_DEPARTMENT_OTHER): Payer: Medicare Other

## 2012-04-03 ENCOUNTER — Other Ambulatory Visit: Payer: Self-pay | Admitting: Oncology

## 2012-04-03 DIAGNOSIS — Z23 Encounter for immunization: Secondary | ICD-10-CM | POA: Diagnosis not present

## 2012-04-03 DIAGNOSIS — C8589 Other specified types of non-Hodgkin lymphoma, extranodal and solid organ sites: Secondary | ICD-10-CM

## 2012-04-03 MED ORDER — INFLUENZA VIRUS VACC SPLIT PF IM SUSP
0.5000 mL | Freq: Once | INTRAMUSCULAR | Status: AC
  Administered 2012-04-03: 0.5 mL via INTRAMUSCULAR
  Filled 2012-04-03: qty 0.5

## 2012-04-03 NOTE — Progress Notes (Signed)
Patient in office with husband and requests flu vaccine

## 2012-04-11 ENCOUNTER — Other Ambulatory Visit: Payer: Self-pay

## 2012-04-11 DIAGNOSIS — I10 Essential (primary) hypertension: Secondary | ICD-10-CM

## 2012-04-11 MED ORDER — LOSARTAN POTASSIUM-HCTZ 50-12.5 MG PO TABS: 1.0000 | ORAL_TABLET | Freq: Every day | ORAL | Status: DC

## 2012-04-11 MED ORDER — NEBIVOLOL HCL 5 MG PO TABS: 5.0000 mg | ORAL_TABLET | Freq: Every day | ORAL | Status: DC

## 2012-06-25 DIAGNOSIS — Z1231 Encounter for screening mammogram for malignant neoplasm of breast: Secondary | ICD-10-CM | POA: Diagnosis not present

## 2012-06-25 DIAGNOSIS — Z803 Family history of malignant neoplasm of breast: Secondary | ICD-10-CM | POA: Diagnosis not present

## 2012-07-04 ENCOUNTER — Encounter: Payer: Self-pay | Admitting: *Deleted

## 2012-07-04 NOTE — Progress Notes (Signed)
Request from McKenzie at [facility]: Patient sees Dr. Ivette Loyal Long next week. Need most recent office note/labs/scans faxed to [phone]. Call #[phone] with questions. Forwarded request to HIM.

## 2012-08-08 DIAGNOSIS — C8589 Other specified types of non-Hodgkin lymphoma, extranodal and solid organ sites: Secondary | ICD-10-CM | POA: Diagnosis not present

## 2012-10-12 ENCOUNTER — Telehealth: Payer: Self-pay | Admitting: Internal Medicine

## 2012-10-12 DIAGNOSIS — I10 Essential (primary) hypertension: Secondary | ICD-10-CM

## 2012-10-12 MED ORDER — NEBIVOLOL HCL 5 MG PO TABS: 5.0000 mg | ORAL_TABLET | Freq: Every day | ORAL | Status: DC

## 2012-10-12 MED ORDER — LOSARTAN POTASSIUM-HCTZ 50-12.5 MG PO TABS: 1.0000 | ORAL_TABLET | Freq: Every day | ORAL | Status: DC

## 2012-10-12 NOTE — Telephone Encounter (Signed)
done

## 2012-10-12 NOTE — Telephone Encounter (Signed)
Pt son notiifed

## 2012-10-12 NOTE — Telephone Encounter (Signed)
Mrs. Jamie Villa's husband just passed away.  She rescheduled Monday's appt to Friday May 2.  She is requesting enough Bystolic and Losartan to last until Friday to be sent to [facility] on [address].

## 2012-10-15 ENCOUNTER — Ambulatory Visit: Payer: Medicare Other | Admitting: Internal Medicine

## 2012-10-19 ENCOUNTER — Ambulatory Visit: Payer: Medicare Other | Admitting: Internal Medicine

## 2012-10-25 DIAGNOSIS — Z1211 Encounter for screening for malignant neoplasm of colon: Secondary | ICD-10-CM | POA: Diagnosis not present

## 2012-10-25 DIAGNOSIS — R1013 Epigastric pain: Secondary | ICD-10-CM | POA: Diagnosis not present

## 2012-10-25 DIAGNOSIS — R198 Other specified symptoms and signs involving the digestive system and abdomen: Secondary | ICD-10-CM | POA: Diagnosis not present

## 2012-10-25 DIAGNOSIS — R634 Abnormal weight loss: Secondary | ICD-10-CM | POA: Diagnosis not present

## 2012-10-25 DIAGNOSIS — K625 Hemorrhage of anus and rectum: Secondary | ICD-10-CM | POA: Diagnosis not present

## 2012-10-26 ENCOUNTER — Encounter: Payer: Self-pay | Admitting: Internal Medicine

## 2012-10-26 LAB — CBC
Hemoglobin: 13.2 g/dL (ref 12.0–16.0)
MCH: 32.3
MCHC: 34.8
MCV: 92.7 fL

## 2012-11-07 DIAGNOSIS — R198 Other specified symptoms and signs involving the digestive system and abdomen: Secondary | ICD-10-CM | POA: Diagnosis not present

## 2012-11-07 DIAGNOSIS — D130 Benign neoplasm of esophagus: Secondary | ICD-10-CM | POA: Diagnosis not present

## 2012-11-07 DIAGNOSIS — K319 Disease of stomach and duodenum, unspecified: Secondary | ICD-10-CM | POA: Diagnosis not present

## 2012-11-07 DIAGNOSIS — R1013 Epigastric pain: Secondary | ICD-10-CM | POA: Diagnosis not present

## 2012-11-07 DIAGNOSIS — D126 Benign neoplasm of colon, unspecified: Secondary | ICD-10-CM | POA: Diagnosis not present

## 2012-11-07 DIAGNOSIS — K625 Hemorrhage of anus and rectum: Secondary | ICD-10-CM | POA: Diagnosis not present

## 2012-11-07 DIAGNOSIS — Z1211 Encounter for screening for malignant neoplasm of colon: Secondary | ICD-10-CM | POA: Diagnosis not present

## 2012-11-07 LAB — HM COLONOSCOPY

## 2012-12-26 DIAGNOSIS — Z961 Presence of intraocular lens: Secondary | ICD-10-CM | POA: Diagnosis not present

## 2012-12-26 DIAGNOSIS — H43819 Vitreous degeneration, unspecified eye: Secondary | ICD-10-CM | POA: Diagnosis not present

## 2012-12-26 DIAGNOSIS — H524 Presbyopia: Secondary | ICD-10-CM | POA: Diagnosis not present

## 2012-12-26 DIAGNOSIS — H04129 Dry eye syndrome of unspecified lacrimal gland: Secondary | ICD-10-CM | POA: Diagnosis not present

## 2012-12-26 DIAGNOSIS — H40019 Open angle with borderline findings, low risk, unspecified eye: Secondary | ICD-10-CM | POA: Diagnosis not present

## 2013-02-11 DIAGNOSIS — C8589 Other specified types of non-Hodgkin lymphoma, extranodal and solid organ sites: Secondary | ICD-10-CM | POA: Diagnosis not present

## 2013-02-11 DIAGNOSIS — Z452 Encounter for adjustment and management of vascular access device: Secondary | ICD-10-CM | POA: Diagnosis not present

## 2013-03-28 DIAGNOSIS — H01009 Unspecified blepharitis unspecified eye, unspecified eyelid: Secondary | ICD-10-CM | POA: Diagnosis not present

## 2013-03-28 DIAGNOSIS — Z961 Presence of intraocular lens: Secondary | ICD-10-CM | POA: Diagnosis not present

## 2013-03-28 DIAGNOSIS — H40019 Open angle with borderline findings, low risk, unspecified eye: Secondary | ICD-10-CM | POA: Diagnosis not present

## 2013-05-03 ENCOUNTER — Other Ambulatory Visit: Payer: Self-pay | Admitting: Internal Medicine

## 2013-05-08 ENCOUNTER — Encounter: Payer: Self-pay | Admitting: *Deleted

## 2013-06-01 ENCOUNTER — Other Ambulatory Visit: Payer: Self-pay | Admitting: Internal Medicine

## 2013-06-04 ENCOUNTER — Ambulatory Visit (INDEPENDENT_AMBULATORY_CARE_PROVIDER_SITE_OTHER): Payer: Medicare Other

## 2013-06-04 ENCOUNTER — Other Ambulatory Visit: Payer: Self-pay | Admitting: Internal Medicine

## 2013-06-04 ENCOUNTER — Encounter: Payer: Self-pay | Admitting: Internal Medicine

## 2013-06-04 ENCOUNTER — Ambulatory Visit (INDEPENDENT_AMBULATORY_CARE_PROVIDER_SITE_OTHER): Payer: Medicare Other | Admitting: Internal Medicine

## 2013-06-04 VITALS — BP 158/90 | HR 68 | Temp 97.8°F | Resp 16 | Ht 65.0 in | Wt 162.2 lb

## 2013-06-04 DIAGNOSIS — R739 Hyperglycemia, unspecified: Secondary | ICD-10-CM | POA: Insufficient documentation

## 2013-06-04 DIAGNOSIS — Z0000 Encounter for general adult medical examination without abnormal findings: Secondary | ICD-10-CM

## 2013-06-04 DIAGNOSIS — C8589 Other specified types of non-Hodgkin lymphoma, extranodal and solid organ sites: Secondary | ICD-10-CM | POA: Diagnosis not present

## 2013-06-04 DIAGNOSIS — I10 Essential (primary) hypertension: Secondary | ICD-10-CM

## 2013-06-04 DIAGNOSIS — E7800 Pure hypercholesterolemia, unspecified: Secondary | ICD-10-CM | POA: Diagnosis not present

## 2013-06-04 DIAGNOSIS — Z2911 Encounter for prophylactic immunotherapy for respiratory syncytial virus (RSV): Secondary | ICD-10-CM | POA: Diagnosis not present

## 2013-06-04 DIAGNOSIS — R7309 Other abnormal glucose: Secondary | ICD-10-CM

## 2013-06-04 DIAGNOSIS — Z1231 Encounter for screening mammogram for malignant neoplasm of breast: Secondary | ICD-10-CM | POA: Insufficient documentation

## 2013-06-04 DIAGNOSIS — Z23 Encounter for immunization: Secondary | ICD-10-CM | POA: Diagnosis not present

## 2013-06-04 DIAGNOSIS — E785 Hyperlipidemia, unspecified: Secondary | ICD-10-CM | POA: Insufficient documentation

## 2013-06-04 LAB — COMPREHENSIVE METABOLIC PANEL
AST: 21 U/L (ref 0–37)
Albumin: 4.4 g/dL (ref 3.5–5.2)
Alkaline Phosphatase: 52 U/L (ref 39–117)
BUN: 20 mg/dL (ref 6–23)
Creatinine, Ser: 0.8 mg/dL (ref 0.4–1.2)
GFR: 74.46 mL/min (ref >60.00)
Glucose, Bld: 95 mg/dL (ref 70–99)
Potassium: 3.7 mEq/L (ref 3.5–5.1)

## 2013-06-04 LAB — CBC WITH DIFFERENTIAL/PLATELET
Basophils Relative: 0.3 % (ref 0.0–3.0)
Eosinophils Absolute: 0 10*3/uL (ref 0.0–0.7)
Hemoglobin: 14 g/dL (ref 12.0–15.0)
Lymphocytes Relative: 28.2 % (ref 12.0–46.0)
MCHC: 34.5 g/dL (ref 30.0–36.0)
Monocytes Absolute: 0.5 10*3/uL (ref 0.1–1.0)
Monocytes Relative: 10.8 % (ref 3.0–12.0)
Neutro Abs: 2.5 10*3/uL (ref 1.4–7.7)
Neutrophils Relative %: 60.1 % (ref 43.0–77.0)
RBC: 4.26 Mil/uL (ref 3.87–5.11)
RDW: 12.9 % (ref 11.5–14.6)
WBC: 4.2 10*3/uL — ABNORMAL LOW (ref 4.5–10.5)

## 2013-06-04 LAB — LIPID PANEL
Cholesterol: 262 mg/dL — ABNORMAL HIGH (ref 0–200)
HDL: 71.2 mg/dL (ref >39.00)
Total CHOL/HDL Ratio: 4
Triglycerides: 136 mg/dL (ref 0.0–149.0)
VLDL: 27.2 mg/dL (ref 0.0–40.0)

## 2013-06-04 MED ORDER — NEBIVOLOL HCL 10 MG PO TABS: 10.0000 mg | ORAL_TABLET | Freq: Every day | ORAL | Status: DC

## 2013-06-04 NOTE — Assessment & Plan Note (Signed)
Her goal is an LDL < 130 Today I will check her FLP and will treat if needed

## 2013-06-04 NOTE — Patient Instructions (Signed)
Health Maintenance, Female A healthy lifestyle and preventative care can promote health and wellness.  Maintain regular health, dental, and eye exams.  Eat a healthy diet. Foods like vegetables, fruits, whole grains, low-fat dairy products, and lean protein foods contain the nutrients you need without too many calories. Decrease your intake of foods high in solid fats, added sugars, and salt. Get information about a proper diet from your caregiver, if necessary.  Regular physical exercise is one of the most important things you can do for your health. Most adults should get at least 150 minutes of moderate-intensity exercise (any activity that increases your heart rate and causes you to sweat) each week. In addition, most adults need muscle-strengthening exercises on 2 or more days a week.   Maintain a healthy weight. The body mass index (BMI) is a screening tool to identify possible weight problems. It provides an estimate of body fat based on height and weight. Your caregiver can help determine your BMI, and can help you achieve or maintain a healthy weight. For adults 20 years and older:  A BMI below 18.5 is considered underweight.  A BMI of 18.5 to 24.9 is normal.  A BMI of 25 to 29.9 is considered overweight.  A BMI of 30 and above is considered obese.  Maintain normal blood lipids and cholesterol by exercising and minimizing your intake of saturated fat. Eat a balanced diet with plenty of fruits and vegetables. Blood tests for lipids and cholesterol should begin at age 20 and be repeated every 5 years. If your lipid or cholesterol levels are high, you are over 50, or you are a high risk for heart disease, you may need your cholesterol levels checked more frequently.Ongoing high lipid and cholesterol levels should be treated with medicines if diet and exercise are not effective.  If you smoke, find out from your caregiver how to quit. If you do not use tobacco, do not start.  Lung  cancer screening is recommended for adults aged 55 80 years who are at high risk for developing lung cancer because of a history of smoking. Yearly low-dose computed tomography (CT) is recommended for people who have at least a 30-pack-year history of smoking and are a current smoker or have quit within the past 15 years. A pack year of smoking is smoking an average of 1 pack of cigarettes a day for 1 year (for example: 1 pack a day for 30 years or 2 packs a day for 15 years). Yearly screening should continue until the smoker has stopped smoking for at least 15 years. Yearly screening should also be stopped for people who develop a health problem that would prevent them from having lung cancer treatment.  If you are pregnant, do not drink alcohol. If you are breastfeeding, be very cautious about drinking alcohol. If you are not pregnant and choose to drink alcohol, do not exceed 1 drink per day. One drink is considered to be 12 ounces (355 mL) of beer, 5 ounces (148 mL) of wine, or 1.5 ounces (44 mL) of liquor.  Avoid use of street drugs. Do not share needles with anyone. Ask for help if you need support or instructions about stopping the use of drugs.  High blood pressure causes heart disease and increases the risk of stroke. Blood pressure should be checked at least every 1 to 2 years. Ongoing high blood pressure should be treated with medicines, if weight loss and exercise are not effective.  If you are 55 to   69 years old, ask your caregiver if you should take aspirin to prevent strokes.  Diabetes screening involves taking a blood sample to check your fasting blood sugar level. This should be done once every 3 years, after age 45, if you are within normal weight and without risk factors for diabetes. Testing should be considered at a younger age or be carried out more frequently if you are overweight and have at least 1 risk factor for diabetes.  Breast cancer screening is essential preventative care  for women. You should practice "breast self-awareness." This means understanding the normal appearance and feel of your breasts and may include breast self-examination. Any changes detected, no matter how small, should be reported to a caregiver. Women in their 20s and 30s should have a clinical breast exam (CBE) by a caregiver as part of a regular health exam every 1 to 3 years. After age 40, women should have a CBE every year. Starting at age 40, women should consider having a mammogram (breast X-ray) every year. Women who have a family history of breast cancer should talk to their caregiver about genetic screening. Women at a high risk of breast cancer should talk to their caregiver about having an MRI and a mammogram every year.  Breast cancer gene (BRCA)-related cancer risk assessment is recommended for women who have family members with BRCA-related cancers. BRCA-related cancers include breast, ovarian, tubal, and peritoneal cancers. Having family members with these cancers may be associated with an increased risk for harmful changes (mutations) in the breast cancer genes BRCA1 and BRCA2. Results of the assessment will determine the need for genetic counseling and BRCA1 and BRCA2 testing.  The Pap test is a screening test for cervical cancer. Women should have a Pap test starting at age 21. Between ages 21 and 29, Pap tests should be repeated every 2 years. Beginning at age 30, you should have a Pap test every 3 years as long as the past 3 Pap tests have been normal. If you had a hysterectomy for a problem that was not cancer or a condition that could lead to cancer, then you no longer need Pap tests. If you are between ages 65 and 70, and you have had normal Pap tests going back 10 years, you no longer need Pap tests. If you have had past treatment for cervical cancer or a condition that could lead to cancer, you need Pap tests and screening for cancer for at least 20 years after your treatment. If Pap  tests have been discontinued, risk factors (such as a new sexual partner) need to be reassessed to determine if screening should be resumed. Some women have medical problems that increase the chance of getting cervical cancer. In these cases, your caregiver may recommend more frequent screening and Pap tests.  The human papillomavirus (HPV) test is an additional test that may be used for cervical cancer screening. The HPV test looks for the virus that can cause the cell changes on the cervix. The cells collected during the Pap test can be tested for HPV. The HPV test could be used to screen women aged 30 years and older, and should be used in women of any age who have unclear Pap test results. After the age of 30, women should have HPV testing at the same frequency as a Pap test.  Colorectal cancer can be detected and often prevented. Most routine colorectal cancer screening begins at the age of 50 and continues through age 75. However, your caregiver   may recommend screening at an earlier age if you have risk factors for colon cancer. On a yearly basis, your caregiver may provide home test kits to check for hidden blood in the stool. Use of a small camera at the end of a tube, to directly examine the colon (sigmoidoscopy or colonoscopy), can detect the earliest forms of colorectal cancer. Talk to your caregiver about this at age 50, when routine screening begins. Direct examination of the colon should be repeated every 5 to 10 years through age 75, unless early forms of pre-cancerous polyps or small growths are found.  Hepatitis C blood testing is recommended for all people born from 1945 through 1965 and any individual with known risks for hepatitis C.  Practice safe sex. Use condoms and avoid high-risk sexual practices to reduce the spread of sexually transmitted infections (STIs). Sexually active women aged 25 and younger should be checked for Chlamydia, which is a common sexually transmitted infection.  Older women with new or multiple partners should also be tested for Chlamydia. Testing for other STIs is recommended if you are sexually active and at increased risk.  Osteoporosis is a disease in which the bones lose minerals and strength with aging. This can result in serious bone fractures. The risk of osteoporosis can be identified using a bone density scan. Women ages 65 and over and women at risk for fractures or osteoporosis should discuss screening with their caregivers. Ask your caregiver whether you should be taking a calcium supplement or vitamin D to reduce the rate of osteoporosis.  Menopause can be associated with physical symptoms and risks. Hormone replacement therapy is available to decrease symptoms and risks. You should talk to your caregiver about whether hormone replacement therapy is right for you.  Use sunscreen. Apply sunscreen liberally and repeatedly throughout the day. You should seek shade when your shadow is shorter than you. Protect yourself by wearing long sleeves, pants, a wide-brimmed hat, and sunglasses year round, whenever you are outdoors.  Notify your caregiver of new moles or changes in moles, especially if there is a change in shape or color. Also notify your caregiver if a mole is larger than the size of a pencil eraser.  Stay current with your immunizations. Document Released: 12/20/2010 Document Revised: 10/01/2012 Document Reviewed: 12/20/2010 [employer] Patient Information 2014 [employer]. Hypertension As your heart beats, it forces blood through your arteries. This force is your blood pressure. If the pressure is too high, it is called hypertension (HTN) or high blood pressure. HTN is dangerous because you may have it and not know it. High blood pressure may mean that your heart has to work harder to pump blood. Your arteries may be narrow or stiff. The extra work puts you at risk for heart disease, stroke, and other problems.  Blood pressure consists of  two numbers, a higher number over a lower, 110/72, for example. It is stated as "110 over 72." The ideal is below 120 for the top number (systolic) and under 80 for the bottom (diastolic). Write down your blood pressure today. You should pay close attention to your blood pressure if you have certain conditions such as:  Heart failure.  Prior heart attack.  Diabetes  Chronic kidney disease.  Prior stroke.  Multiple risk factors for heart disease. To see if you have HTN, your blood pressure should be measured while you are seated with your arm held at the level of the heart. It should be measured at least twice. A one-time   elevated blood pressure reading (especially in the Emergency Department) does not mean that you need treatment. There may be conditions in which the blood pressure is different between your right and left arms. It is important to see your caregiver soon for a recheck. Most people have essential hypertension which means that there is not a specific cause. This type of high blood pressure may be lowered by changing lifestyle factors such as:  Stress.  Smoking.  Lack of exercise.  Excessive weight.  Drug/tobacco/alcohol use.  Eating less salt. Most people do not have symptoms from high blood pressure until it has caused damage to the body. Effective treatment can often prevent, delay or reduce that damage. TREATMENT  When a cause has been identified, treatment for high blood pressure is directed at the cause. There are a large number of medications to treat HTN. These fall into several categories, and your caregiver will help you select the medicines that are best for you. Medications may have side effects. You should review side effects with your caregiver. If your blood pressure stays high after you have made lifestyle changes or started on medicines,   Your medication(s) may need to be changed.  Other problems may need to be addressed.  Be certain you understand  your prescriptions, and know how and when to take your medicine.  Be sure to follow up with your caregiver within the time frame advised (usually within two weeks) to have your blood pressure rechecked and to review your medications.  If you are taking more than one medicine to lower your blood pressure, make sure you know how and at what times they should be taken. Taking two medicines at the same time can result in blood pressure that is too low. SEEK IMMEDIATE MEDICAL CARE IF:  You develop a severe headache, blurred or changing vision, or confusion.  You have unusual weakness or numbness, or a faint feeling.  You have severe chest or abdominal pain, vomiting, or breathing problems. MAKE SURE YOU:   Understand these instructions.  Will watch your condition.  Will get help right away if you are not doing well or get worse. Document Released: 06/06/2005 Document Revised: 08/29/2011 Document Reviewed: 01/25/2008 [employer] Patient Information 2014 [employer].  

## 2013-06-04 NOTE — Assessment & Plan Note (Signed)

## 2013-06-04 NOTE — Assessment & Plan Note (Signed)
Her BP is not well controlled so I have increased the dose on her bystolic, she will continue the ARB and HCTZ Today I will check her lytes and renal function

## 2013-06-04 NOTE — Progress Notes (Signed)
Pre visit review using our clinic review tool, if applicable. No additional management support is needed unless otherwise documented below in the visit note. 

## 2013-06-04 NOTE — Assessment & Plan Note (Signed)
She is in remission I will recheck her CBC today

## 2013-06-04 NOTE — Progress Notes (Signed)
   Subjective:    Patient ID: Jamie Villa, female    DOB: [birth year], 67 y.o.   MRN: [id number]  Hypertension This is a chronic problem. The current episode started more than 1 year ago. The problem has been gradually worsening since onset. The problem is uncontrolled. Pertinent negatives include no anxiety, blurred vision, chest pain, headaches, malaise/fatigue, neck pain, orthopnea, palpitations, peripheral edema, PND, shortness of breath or sweats. There are no associated agents to hypertension. Past treatments include angiotensin blockers, beta blockers and diuretics. The current treatment provides moderate improvement. Compliance problems include exercise and diet.  Hypertensive end-organ damage includes kidney disease. Identifiable causes of hypertension include chronic renal disease.      Review of Systems  Constitutional: Negative.  Negative for fever, chills, malaise/fatigue, diaphoresis, appetite change and fatigue.  HENT: Negative.   Eyes: Negative.  Negative for blurred vision.  Respiratory: Negative.  Negative for apnea, cough, choking, chest tightness, shortness of breath, wheezing and stridor.   Cardiovascular: Negative.  Negative for chest pain, palpitations, orthopnea, leg swelling and PND.  Gastrointestinal: Negative.  Negative for nausea, vomiting, abdominal pain, diarrhea and blood in stool.  Endocrine: Negative.   Genitourinary: Negative.   Musculoskeletal: Negative.  Negative for neck pain.  Skin: Negative.   Allergic/Immunologic: Negative.   Neurological: Negative.  Negative for dizziness, tremors, syncope, weakness, light-headedness, numbness and headaches.  Hematological: Negative.  Negative for adenopathy. Does not bruise/bleed easily.  Psychiatric/Behavioral: Negative.        Objective:   Physical Exam  Vitals reviewed. Constitutional: She is oriented to person, place, and time. She appears well-developed and well-nourished. No distress.  HENT:  Head:  Normocephalic and atraumatic.  Mouth/Throat: Oropharynx is clear and moist. No oropharyngeal exudate.  Eyes: Conjunctivae are normal. Right eye exhibits no discharge. Left eye exhibits no discharge. No scleral icterus.  Neck: Normal range of motion. Neck supple. No JVD present. No tracheal deviation present. No thyromegaly present.  Cardiovascular: Normal rate, regular rhythm, normal heart sounds and intact distal pulses.  Exam reveals no gallop and no friction rub.   No murmur heard. Pulmonary/Chest: Effort normal and breath sounds normal. No stridor. No respiratory distress. She has no wheezes. She has no rales. She exhibits no tenderness.  Abdominal: Soft. Bowel sounds are normal. She exhibits no distension and no mass. There is no tenderness. There is no rebound and no guarding.  Musculoskeletal: Normal range of motion. She exhibits no edema and no tenderness.  Lymphadenopathy:    She has no cervical adenopathy.  Neurological: She is oriented to person, place, and time.  Skin: Skin is warm and dry. No rash noted. She is not diaphoretic. No erythema. No pallor.  Psychiatric: She has a normal mood and affect. Her behavior is normal. Judgment and thought content normal.     Lab Results  Component Value Date   WBC 5.8 10/25/2012   HGB 13.2 10/25/2012   HCT 38 10/25/2012   PLT 154 01/24/2011   GLUCOSE 105* 09/23/2010   ALT 31 12/17/2009   AST 56* 12/17/2009   NA 144 09/23/2010   K 4.0 09/23/2010   CL 107 09/23/2010   CREATININE 0.90 09/23/2010   BUN 22 09/23/2010   CO2 28 09/23/2010   INR 0.90 10/14/2009       Assessment & Plan:

## 2013-06-04 NOTE — Assessment & Plan Note (Signed)
I will check her A1C to see if she has developed DM2 

## 2013-06-05 ENCOUNTER — Encounter: Payer: Self-pay | Admitting: Internal Medicine

## 2013-06-05 LAB — LDL CHOLESTEROL, DIRECT: Direct LDL: 174 mg/dL

## 2013-10-21 DIAGNOSIS — C8589 Other specified types of non-Hodgkin lymphoma, extranodal and solid organ sites: Secondary | ICD-10-CM | POA: Diagnosis not present

## 2013-10-21 DIAGNOSIS — Z9481 Bone marrow transplant status: Secondary | ICD-10-CM | POA: Diagnosis not present

## 2013-10-21 DIAGNOSIS — G609 Hereditary and idiopathic neuropathy, unspecified: Secondary | ICD-10-CM | POA: Diagnosis not present

## 2013-12-05 ENCOUNTER — Ambulatory Visit (INDEPENDENT_AMBULATORY_CARE_PROVIDER_SITE_OTHER): Payer: Medicare Other | Admitting: *Deleted

## 2013-12-05 ENCOUNTER — Other Ambulatory Visit: Payer: Self-pay | Admitting: Internal Medicine

## 2013-12-05 DIAGNOSIS — Z23 Encounter for immunization: Secondary | ICD-10-CM

## 2014-03-19 ENCOUNTER — Other Ambulatory Visit: Payer: Self-pay

## 2014-03-19 MED ORDER — LOSARTAN POTASSIUM-HCTZ 50-12.5 MG PO TABS: ORAL_TABLET | ORAL | Status: DC

## 2014-04-23 DIAGNOSIS — C8590 Non-Hodgkin lymphoma, unspecified, unspecified site: Secondary | ICD-10-CM | POA: Diagnosis not present

## 2014-04-23 DIAGNOSIS — Z9481 Bone marrow transplant status: Secondary | ICD-10-CM | POA: Diagnosis not present

## 2014-04-23 DIAGNOSIS — Z23 Encounter for immunization: Secondary | ICD-10-CM | POA: Diagnosis not present

## 2014-04-23 DIAGNOSIS — I10 Essential (primary) hypertension: Secondary | ICD-10-CM | POA: Diagnosis not present

## 2014-04-23 DIAGNOSIS — K8020 Calculus of gallbladder without cholecystitis without obstruction: Secondary | ICD-10-CM | POA: Diagnosis not present

## 2014-06-10 ENCOUNTER — Other Ambulatory Visit: Payer: Self-pay | Admitting: Internal Medicine

## 2014-06-16 DIAGNOSIS — J018 Other acute sinusitis: Secondary | ICD-10-CM | POA: Diagnosis not present

## 2014-06-16 DIAGNOSIS — R05 Cough: Secondary | ICD-10-CM | POA: Diagnosis not present

## 2014-08-27 ENCOUNTER — Ambulatory Visit (INDEPENDENT_AMBULATORY_CARE_PROVIDER_SITE_OTHER): Payer: Medicare Other | Admitting: Internal Medicine

## 2014-08-27 ENCOUNTER — Other Ambulatory Visit (INDEPENDENT_AMBULATORY_CARE_PROVIDER_SITE_OTHER): Payer: Medicare Other

## 2014-08-27 ENCOUNTER — Encounter: Payer: Self-pay | Admitting: Internal Medicine

## 2014-08-27 VITALS — BP 144/80 | HR 72 | Temp 97.9°F | Resp 16 | Ht 66.0 in | Wt 160.0 lb

## 2014-08-27 DIAGNOSIS — C8591 Non-Hodgkin lymphoma, unspecified, lymph nodes of head, face, and neck: Secondary | ICD-10-CM

## 2014-08-27 DIAGNOSIS — Z0000 Encounter for general adult medical examination without abnormal findings: Secondary | ICD-10-CM | POA: Diagnosis not present

## 2014-08-27 DIAGNOSIS — R739 Hyperglycemia, unspecified: Secondary | ICD-10-CM

## 2014-08-27 DIAGNOSIS — E7800 Pure hypercholesterolemia, unspecified: Secondary | ICD-10-CM

## 2014-08-27 DIAGNOSIS — I10 Essential (primary) hypertension: Secondary | ICD-10-CM | POA: Diagnosis not present

## 2014-08-27 LAB — COMPREHENSIVE METABOLIC PANEL
ALK PHOS: 64 U/L (ref 39–117)
ALT: 18 U/L (ref 0–35)
AST: 19 U/L (ref 0–37)
Albumin: 4.4 g/dL (ref 3.5–5.2)
BILIRUBIN TOTAL: 0.6 mg/dL (ref 0.2–1.2)
BUN: 21 mg/dL (ref 6–23)
CALCIUM: 9.8 mg/dL (ref 8.4–10.5)
CHLORIDE: 105 meq/L (ref 96–112)
CO2: 34 mEq/L — ABNORMAL HIGH (ref 19–32)
Creatinine, Ser: 0.95 mg/dL (ref 0.40–1.20)
GFR: 61.73 mL/min (ref >60.00)
Glucose, Bld: 98 mg/dL (ref 70–99)
Potassium: 3.6 mEq/L (ref 3.5–5.1)
SODIUM: 142 meq/L (ref 135–145)
Total Protein: 7 g/dL (ref 6.0–8.3)

## 2014-08-27 LAB — CBC WITH DIFFERENTIAL/PLATELET
Basophils Absolute: 0 10*3/uL (ref 0.0–0.1)
Basophils Relative: 0.2 % (ref 0.0–3.0)
EOS ABS: 0 10*3/uL (ref 0.0–0.7)
Eosinophils Relative: 0.5 % (ref 0.0–5.0)
HEMATOCRIT: 40.9 % (ref 36.0–46.0)
Hemoglobin: 14.1 g/dL (ref 12.0–15.0)
Lymphocytes Relative: 31.7 % (ref 12.0–46.0)
Lymphs Abs: 1.8 10*3/uL (ref 0.7–4.0)
MCHC: 34.5 g/dL (ref 30.0–36.0)
MCV: 95.3 fl (ref 78.0–100.0)
MONOS PCT: 9.2 % (ref 3.0–12.0)
Monocytes Absolute: 0.5 10*3/uL (ref 0.1–1.0)
NEUTROS ABS: 3.2 10*3/uL (ref 1.4–7.7)
NEUTROS PCT: 58.4 % (ref 43.0–77.0)
Platelets: 183 10*3/uL (ref 150.0–400.0)
RBC: 4.29 Mil/uL (ref 3.87–5.11)
RDW: 13.6 % (ref 11.5–15.5)
WBC: 5.6 10*3/uL (ref 4.0–10.5)

## 2014-08-27 LAB — LIPID PANEL
CHOL/HDL RATIO: 4
Cholesterol: 255 mg/dL — ABNORMAL HIGH (ref 0–200)
HDL: 69 mg/dL (ref >39.00)
NonHDL: 186
Triglycerides: 255 mg/dL — ABNORMAL HIGH (ref 0.0–149.0)
VLDL: 51 mg/dL — AB (ref 0.0–40.0)

## 2014-08-27 LAB — LDL CHOLESTEROL, DIRECT: LDL DIRECT: 153 mg/dL

## 2014-08-27 LAB — HEMOGLOBIN A1C: HEMOGLOBIN A1C: 5.4 % (ref 4.6–6.5)

## 2014-08-27 MED ORDER — CARVEDILOL 6.25 MG PO TABS: 6.2500 mg | ORAL_TABLET | Freq: Two times a day (BID) | ORAL | Status: DC

## 2014-08-27 NOTE — Progress Notes (Signed)
   Subjective:    Patient ID: Jamie Villa, female    DOB: 1946-05-14, 69 y.o.   MRN: [id number]  Hypertension This is a chronic problem. The current episode started more than 1 year ago. The problem is unchanged. The problem is controlled. Pertinent negatives include no anxiety, blurred vision, chest pain, headaches, malaise/fatigue, neck pain, orthopnea, palpitations, peripheral edema, PND, shortness of breath or sweats. There are no associated agents to hypertension. Past treatments include angiotensin blockers and diuretics. The current treatment provides moderate improvement. Compliance problems include diet and exercise.       Review of Systems  Constitutional: Negative.  Negative for fever, chills, malaise/fatigue, diaphoresis, appetite change and fatigue.  HENT: Negative.   Eyes: Negative.  Negative for blurred vision.  Respiratory: Negative.  Negative for apnea, cough, choking, chest tightness, shortness of breath and stridor.   Cardiovascular: Negative.  Negative for chest pain, palpitations, orthopnea, leg swelling and PND.  Gastrointestinal: Negative.  Negative for nausea, vomiting, abdominal pain, diarrhea, constipation and blood in stool.  Endocrine: Negative.   Genitourinary: Negative.   Musculoskeletal: Negative.  Negative for back pain, arthralgias and neck pain.  Skin: Negative.  Negative for rash.  Allergic/Immunologic: Negative.   Neurological: Negative.  Negative for dizziness, tremors, syncope, light-headedness and headaches.  Hematological: Negative.  Negative for adenopathy. Does not bruise/bleed easily.  Psychiatric/Behavioral: Negative.        Objective:   Physical Exam  Constitutional: She is oriented to person, place, and time. She appears well-developed and well-nourished. No distress.  HENT:  Head: Normocephalic and atraumatic.  Mouth/Throat: Oropharynx is clear and moist. No oropharyngeal exudate.  Eyes: Conjunctivae are normal. Right eye exhibits no  discharge. Left eye exhibits no discharge. No scleral icterus.  Neck: Normal range of motion. Neck supple. No JVD present. No tracheal deviation present. No thyromegaly present.  Cardiovascular: Normal rate, regular rhythm, normal heart sounds and intact distal pulses.  Exam reveals no gallop and no friction rub.   No murmur heard. Pulmonary/Chest: Effort normal and breath sounds normal. No stridor. No respiratory distress. She has no wheezes. She has no rales. She exhibits no tenderness.  Abdominal: Soft. Bowel sounds are normal. She exhibits no distension and no mass. There is no tenderness. There is no rebound and no guarding.  Musculoskeletal: Normal range of motion. She exhibits no edema or tenderness.  Lymphadenopathy:    She has no cervical adenopathy.  Neurological: She is oriented to person, place, and time.  Skin: Skin is warm and dry. No rash noted. She is not diaphoretic. No erythema. No pallor.  Psychiatric: She has a normal mood and affect. Her behavior is normal. Judgment and thought content normal.  Vitals reviewed.    Lab Results  Component Value Date   WBC 5.6 08/27/2014   HGB 14.1 08/27/2014   HCT 40.9 08/27/2014   PLT 183.0 08/27/2014   GLUCOSE 98 08/27/2014   CHOL 255* 08/27/2014   TRIG 255.0* 08/27/2014   HDL 69.00 08/27/2014   LDLDIRECT 153.0 08/27/2014   ALT 18 08/27/2014   AST 19 08/27/2014   NA 142 08/27/2014   K 3.6 08/27/2014   CL 105 08/27/2014   CREATININE 0.95 08/27/2014   BUN 21 08/27/2014   CO2 34* 08/27/2014   TSH 3.07 08/27/2014   INR 0.90 10/14/2009   HGBA1C 5.4 08/27/2014       Assessment & Plan:

## 2014-08-27 NOTE — Progress Notes (Signed)
Pre visit review using our clinic review tool, if applicable. No additional management support is needed unless otherwise documented below in the visit note. 

## 2014-08-27 NOTE — Patient Instructions (Signed)
Hypertension Hypertension, commonly called high blood pressure, is when the force of blood pumping through your arteries is too strong. Your arteries are the blood vessels that carry blood from your heart throughout your body. A blood pressure reading consists of a higher number over a lower number, such as 110/72. The higher number (systolic) is the pressure inside your arteries when your heart pumps. The lower number (diastolic) is the pressure inside your arteries when your heart relaxes. Ideally you want your blood pressure below 120/80. Hypertension forces your heart to work harder to pump blood. Your arteries may become narrow or stiff. Having hypertension puts you at risk for heart disease, stroke, and other problems.  RISK FACTORS Some risk factors for high blood pressure are controllable. Others are not.  Risk factors you cannot control include:   Race. You may be at higher risk if you are African American.  Age. Risk increases with age.  Gender. Men are at higher risk than women before age 45 years. After age 65, women are at higher risk than men. Risk factors you can control include:  Not getting enough exercise or physical activity.  Being overweight.  Getting too much fat, sugar, calories, or salt in your diet.  Drinking too much alcohol. SIGNS AND SYMPTOMS Hypertension does not usually cause signs or symptoms. Extremely high blood pressure (hypertensive crisis) may cause headache, anxiety, shortness of breath, and nosebleed. DIAGNOSIS  To check if you have hypertension, your health care provider will measure your blood pressure while you are seated, with your arm held at the level of your heart. It should be measured at least twice using the same arm. Certain conditions can cause a difference in blood pressure between your right and left arms. A blood pressure reading that is higher than normal on one occasion does not mean that you need treatment. If one blood pressure reading  is high, ask your health care provider about having it checked again. TREATMENT  Treating high blood pressure includes making lifestyle changes and possibly taking medicine. Living a healthy lifestyle can help lower high blood pressure. You may need to change some of your habits. Lifestyle changes may include:  Following the DASH diet. This diet is high in fruits, vegetables, and whole grains. It is low in salt, red meat, and added sugars.  Getting at least 2 hours of brisk physical activity every week.  Losing weight if necessary.  Not smoking.  Limiting alcoholic beverages.  Learning ways to reduce stress. If lifestyle changes are not enough to get your blood pressure under control, your health care provider may prescribe medicine. You may need to take more than one. Work closely with your health care provider to understand the risks and benefits. HOME CARE INSTRUCTIONS  Have your blood pressure rechecked as directed by your health care provider.   Take medicines only as directed by your health care provider. Follow the directions carefully. Blood pressure medicines must be taken as prescribed. The medicine does not work as well when you skip doses. Skipping doses also puts you at risk for problems.   Do not smoke.   Monitor your blood pressure at home as directed by your health care provider. SEEK MEDICAL CARE IF:   You think you are having a reaction to medicines taken.  You have recurrent headaches or feel dizzy.  You have swelling in your ankles.  You have trouble with your vision. SEEK IMMEDIATE MEDICAL CARE IF:  You develop a severe headache or confusion.    You have unusual weakness, numbness, or feel faint.  You have severe chest or abdominal pain.  You vomit repeatedly.  You have trouble breathing. MAKE SURE YOU:   Understand these instructions.  Will watch your condition.  Will get help right away if you are not doing well or get worse. Document  Released: 06/06/2005 Document Revised: 10/21/2013 Document Reviewed: 03/29/2013 [facility] Patient Information 2015 [place], [place]. This information is not intended to replace advice given to you by your health care provider. Make sure you discuss any questions you have with your health care provider. Preventive Care for Adults A healthy lifestyle and preventive care can promote health and wellness. Preventive health guidelines for women include the following key practices.  A routine yearly physical is a good way to check with your health care provider about your health and preventive screening. It is a chance to share any concerns and updates on your health and to receive a thorough exam.  Visit your dentist for a routine exam and preventive care every 6 months. Brush your teeth twice a day and floss once a day. Good oral hygiene prevents tooth decay and gum disease.  The frequency of eye exams is based on your age, health, family medical history, use of contact lenses, and other factors. Follow your health care provider's recommendations for frequency of eye exams.  Eat a healthy diet. Foods like vegetables, fruits, whole grains, low-fat dairy products, and lean protein foods contain the nutrients you need without too many calories. Decrease your intake of foods high in solid fats, added sugars, and salt. Eat the right amount of calories for you.Get information about a proper diet from your health care provider, if necessary.  Regular physical exercise is one of the most important things you can do for your health. Most adults should get at least 150 minutes of moderate-intensity exercise (any activity that increases your heart rate and causes you to sweat) each week. In addition, most adults need muscle-strengthening exercises on 2 or more days a week.  Maintain a healthy weight. The body mass index (BMI) is a screening tool to identify possible weight problems. It provides an estimate of body fat  based on height and weight. Your health care provider can find your BMI and can help you achieve or maintain a healthy weight.For adults 20 years and older:  A BMI below 18.5 is considered underweight.  A BMI of 18.5 to 24.9 is normal.  A BMI of 25 to 29.9 is considered overweight.  A BMI of 30 and above is considered obese.  Maintain normal blood lipids and cholesterol levels by exercising and minimizing your intake of saturated fat. Eat a balanced diet with plenty of fruit and vegetables. Blood tests for lipids and cholesterol should begin at age 43 and be repeated every 5 years. If your lipid or cholesterol levels are high, you are over 50, or you are at high risk for heart disease, you may need your cholesterol levels checked more frequently.Ongoing high lipid and cholesterol levels should be treated with medicines if diet and exercise are not working.  If you smoke, find out from your health care provider how to quit. If you do not use tobacco, do not start.  Lung cancer screening is recommended for adults aged 21-80 years who are at high risk for developing lung cancer because of a history of smoking. A yearly low-dose CT scan of the lungs is recommended for people who have at least a 30-pack-year history of smoking  and are a current smoker or have quit within the past 15 years. A pack year of smoking is smoking an average of 1 pack of cigarettes a day for 1 year (for example: 1 pack a day for 30 years or 2 packs a day for 15 years). Yearly screening should continue until the smoker has stopped smoking for at least 15 years. Yearly screening should be stopped for people who develop a health problem that would prevent them from having lung cancer treatment.  If you are pregnant, do not drink alcohol. If you are breastfeeding, be very cautious about drinking alcohol. If you are not pregnant and choose to drink alcohol, do not have more than 1 drink per day. One drink is considered to be 12  ounces (355 mL) of beer, 5 ounces (148 mL) of wine, or 1.5 ounces (44 mL) of liquor.  Avoid use of street drugs. Do not share needles with anyone. Ask for help if you need support or instructions about stopping the use of drugs.  High blood pressure causes heart disease and increases the risk of stroke. Your blood pressure should be checked at least every 1 to 2 years. Ongoing high blood pressure should be treated with medicines if weight loss and exercise do not work.  If you are 54-39 years old, ask your health care provider if you should take aspirin to prevent strokes.  Diabetes screening involves taking a blood sample to check your fasting blood sugar level. This should be done once every 3 years, after age 66, if you are within normal weight and without risk factors for diabetes. Testing should be considered at a younger age or be carried out more frequently if you are overweight and have at least 1 risk factor for diabetes.  Breast cancer screening is essential preventive care for women. You should practice "breast self-awareness." This means understanding the normal appearance and feel of your breasts and may include breast self-examination. Any changes detected, no matter how small, should be reported to a health care provider. Women in their 71s and 30s should have a clinical breast exam (CBE) by a health care provider as part of a regular health exam every 1 to 3 years. After age 42, women should have a CBE every year. Starting at age 47, women should consider having a mammogram (breast X-ray test) every year. Women who have a family history of breast cancer should talk to their health care provider about genetic screening. Women at a high risk of breast cancer should talk to their health care providers about having an MRI and a mammogram every year.  Breast cancer gene (BRCA)-related cancer risk assessment is recommended for women who have family members with BRCA-related cancers.  BRCA-related cancers include breast, ovarian, tubal, and peritoneal cancers. Having family members with these cancers may be associated with an increased risk for harmful changes (mutations) in the breast cancer genes BRCA1 and BRCA2. Results of the assessment will determine the need for genetic counseling and BRCA1 and BRCA2 testing.  Routine pelvic exams to screen for cancer are no longer recommended for nonpregnant women who are considered low risk for cancer of the pelvic organs (ovaries, uterus, and vagina) and who do not have symptoms. Ask your health care provider if a screening pelvic exam is right for you.  If you have had past treatment for cervical cancer or a condition that could lead to cancer, you need Pap tests and screening for cancer for at least 20 years after  your treatment. If Pap tests have been discontinued, your risk factors (such as having a new sexual partner) need to be reassessed to determine if screening should be resumed. Some women have medical problems that increase the chance of getting cervical cancer. In these cases, your health care provider may recommend more frequent screening and Pap tests.  The HPV test is an additional test that may be used for cervical cancer screening. The HPV test looks for the virus that can cause the cell changes on the cervix. The cells collected during the Pap test can be tested for HPV. The HPV test could be used to screen women aged 71 years and older, and should be used in women of any age who have unclear Pap test results. After the age of 78, women should have HPV testing at the same frequency as a Pap test.  Colorectal cancer can be detected and often prevented. Most routine colorectal cancer screening begins at the age of 62 years and continues through age 65 years. However, your health care provider may recommend screening at an earlier age if you have risk factors for colon cancer. On a yearly basis, your health care provider may  provide home test kits to check for hidden blood in the stool. Use of a small camera at the end of a tube, to directly examine the colon (sigmoidoscopy or colonoscopy), can detect the earliest forms of colorectal cancer. Talk to your health care provider about this at age 66, when routine screening begins. Direct exam of the colon should be repeated every 5-10 years through age 5 years, unless early forms of pre-cancerous polyps or small growths are found.  People who are at an increased risk for hepatitis B should be screened for this virus. You are considered at high risk for hepatitis B if:  You were born in a country where hepatitis B occurs often. Talk with your health care provider about which countries are considered high risk.  Your parents were born in a high-risk country and you have not received a shot to protect against hepatitis B (hepatitis B vaccine).  You have HIV or AIDS.  You use needles to inject street drugs.  You live with, or have sex with, someone who has hepatitis B.  You get hemodialysis treatment.  You take certain medicines for conditions like cancer, organ transplantation, and autoimmune conditions.  Hepatitis C blood testing is recommended for all people born from [birth year] through 1967 and any individual with known risks for hepatitis C.  Practice safe sex. Use condoms and avoid high-risk sexual practices to reduce the spread of sexually transmitted infections (STIs). STIs include gonorrhea, chlamydia, syphilis, trichomonas, herpes, HPV, and human immunodeficiency virus (HIV). Herpes, HIV, and HPV are viral illnesses that have no cure. They can result in disability, cancer, and death.  You should be screened for sexually transmitted illnesses (STIs) including gonorrhea and chlamydia if:  You are sexually active and are younger than 24 years.  You are older than 24 years and your health care provider tells you that you are at risk for this type of  infection.  Your sexual activity has changed since you were last screened and you are at an increased risk for chlamydia or gonorrhea. Ask your health care provider if you are at risk.  If you are at risk of being infected with HIV, it is recommended that you take a prescription medicine daily to prevent HIV infection. This is called preexposure prophylaxis (PrEP). You are considered  at risk if:  You are a heterosexual woman, are sexually active, and are at increased risk for HIV infection.  You take drugs by injection.  You are sexually active with a partner who has HIV.  Talk with your health care provider about whether you are at high risk of being infected with HIV. If you choose to begin PrEP, you should first be tested for HIV. You should then be tested every 3 months for as long as you are taking PrEP.  Osteoporosis is a disease in which the bones lose minerals and strength with aging. This can result in serious bone fractures or breaks. The risk of osteoporosis can be identified using a bone density scan. Women ages 16 years and over and women at risk for fractures or osteoporosis should discuss screening with their health care providers. Ask your health care provider whether you should take a calcium supplement or vitamin D to reduce the rate of osteoporosis.  Menopause can be associated with physical symptoms and risks. Hormone replacement therapy is available to decrease symptoms and risks. You should talk to your health care provider about whether hormone replacement therapy is right for you.  Use sunscreen. Apply sunscreen liberally and repeatedly throughout the day. You should seek shade when your shadow is shorter than you. Protect yourself by wearing long sleeves, pants, a wide-brimmed hat, and sunglasses year round, whenever you are outdoors.  Once a month, do a whole body skin exam, using a mirror to look at the skin on your back. Tell your health care provider of new moles,  moles that have irregular borders, moles that are larger than a pencil eraser, or moles that have changed in shape or color.  Stay current with required vaccines (immunizations).  Influenza vaccine. All adults should be immunized every year.  Tetanus, diphtheria, and acellular pertussis (Td, Tdap) vaccine. Pregnant women should receive 1 dose of Tdap vaccine during each pregnancy. The dose should be obtained regardless of the length of time since the last dose. Immunization is preferred during the 27th-36th week of gestation. An adult who has not previously received Tdap or who does not know her vaccine status should receive 1 dose of Tdap. This initial dose should be followed by tetanus and diphtheria toxoids (Td) booster doses every 10 years. Adults with an unknown or incomplete history of completing a 3-dose immunization series with Td-containing vaccines should begin or complete a primary immunization series including a Tdap dose. Adults should receive a Td booster every 10 years.  Varicella vaccine. An adult without evidence of immunity to varicella should receive 2 doses or a second dose if she has previously received 1 dose. Pregnant females who do not have evidence of immunity should receive the first dose after pregnancy. This first dose should be obtained before leaving the health care facility. The second dose should be obtained 4-8 weeks after the first dose.  Human papillomavirus (HPV) vaccine. Females aged 13-26 years who have not received the vaccine previously should obtain the 3-dose series. The vaccine is not recommended for use in pregnant females. However, pregnancy testing is not needed before receiving a dose. If a female is found to be pregnant after receiving a dose, no treatment is needed. In that case, the remaining doses should be delayed until after the pregnancy. Immunization is recommended for any person with an immunocompromised condition through the age of 95 years if she  did not get any or all doses earlier. During the 3-dose series, the second  dose should be obtained 4-8 weeks after the first dose. The third dose should be obtained 24 weeks after the first dose and 16 weeks after the second dose.  Zoster vaccine. One dose is recommended for adults aged 86 years or older unless certain conditions are present.  Measles, mumps, and rubella (MMR) vaccine. Adults born before [birth year] generally are considered immune to measles and mumps. Adults born in [birth year] or later should have 1 or more doses of MMR vaccine unless there is a contraindication to the vaccine or there is laboratory evidence of immunity to each of the three diseases. A routine second dose of MMR vaccine should be obtained at least 28 days after the first dose for students attending postsecondary schools, health care workers, or international travelers. People who received inactivated measles vaccine or an unknown type of measles vaccine during 1963-1967 should receive 2 doses of MMR vaccine. People who received inactivated mumps vaccine or an unknown type of mumps vaccine before 1979 and are at high risk for mumps infection should consider immunization with 2 doses of MMR vaccine. For females of childbearing age, rubella immunity should be determined. If there is no evidence of immunity, females who are not pregnant should be vaccinated. If there is no evidence of immunity, females who are pregnant should delay immunization until after pregnancy. Unvaccinated health care workers born before [birth year] who lack laboratory evidence of measles, mumps, or rubella immunity or laboratory confirmation of disease should consider measles and mumps immunization with 2 doses of MMR vaccine or rubella immunization with 1 dose of MMR vaccine.  Pneumococcal 13-valent conjugate (PCV13) vaccine. When indicated, a person who is uncertain of her immunization history and has no record of immunization should receive the PCV13 vaccine. An adult  aged 74 years or older who has certain medical conditions and has not been previously immunized should receive 1 dose of PCV13 vaccine. This PCV13 should be followed with a dose of pneumococcal polysaccharide (PPSV23) vaccine. The PPSV23 vaccine dose should be obtained at least 8 weeks after the dose of PCV13 vaccine. An adult aged 53 years or older who has certain medical conditions and previously received 1 or more doses of PPSV23 vaccine should receive 1 dose of PCV13. The PCV13 vaccine dose should be obtained 1 or more years after the last PPSV23 vaccine dose.  Pneumococcal polysaccharide (PPSV23) vaccine. When PCV13 is also indicated, PCV13 should be obtained first. All adults aged 25 years and older should be immunized. An adult younger than age 79 years who has certain medical conditions should be immunized. Any person who resides in a nursing home or long-term care facility should be immunized. An adult smoker should be immunized. People with an immunocompromised condition and certain other conditions should receive both PCV13 and PPSV23 vaccines. People with human immunodeficiency virus (HIV) infection should be immunized as soon as possible after diagnosis. Immunization during chemotherapy or radiation therapy should be avoided. Routine use of PPSV23 vaccine is not recommended for American Indians, [place] Natives, or people younger than 65 years unless there are medical conditions that require PPSV23 vaccine. When indicated, people who have unknown immunization and have no record of immunization should receive PPSV23 vaccine. One-time revaccination 5 years after the first dose of PPSV23 is recommended for people aged 19-64 years who have chronic kidney failure, nephrotic syndrome, asplenia, or immunocompromised conditions. People who received 1-2 doses of PPSV23 before age 61 years should receive another dose of PPSV23 vaccine at age 31 years or later if  at least 5 years have passed since the previous  dose. Doses of PPSV23 are not needed for people immunized with PPSV23 at or after age 46 years.  Meningococcal vaccine. Adults with asplenia or persistent complement component deficiencies should receive 2 doses of quadrivalent meningococcal conjugate (MenACWY-D) vaccine. The doses should be obtained at least 2 months apart. Microbiologists working with certain meningococcal bacteria, [place] recruits, people at risk during an outbreak, and people who travel to or live in countries with a high rate of meningitis should be immunized. A first-year college student up through age 76 years who is living in a residence hall should receive a dose if she did not receive a dose on or after her 16th birthday. Adults who have certain high-risk conditions should receive one or more doses of vaccine.  Hepatitis A vaccine. Adults who wish to be protected from this disease, have certain high-risk conditions, work with hepatitis A-infected animals, work in hepatitis A research labs, or travel to or work in countries with a high rate of hepatitis A should be immunized. Adults who were previously unvaccinated and who anticipate close contact with an international adoptee during the first 60 days after arrival in the [place] from a country with a high rate of hepatitis A should be immunized.  Hepatitis B vaccine. Adults who wish to be protected from this disease, have certain high-risk conditions, may be exposed to blood or other infectious body fluids, are household contacts or sex partners of hepatitis B positive people, are clients or workers in certain care facilities, or travel to or work in countries with a high rate of hepatitis B should be immunized.  Haemophilus influenzae type b (Hib) vaccine. A previously unvaccinated person with asplenia or sickle cell disease or having a scheduled splenectomy should receive 1 dose of Hib vaccine. Regardless of previous immunization, a recipient of a hematopoietic stem cell  transplant should receive a 3-dose series 6-12 months after her successful transplant. Hib vaccine is not recommended for adults with HIV infection. Preventive Services / Frequency Ages 67 to 70 years  Blood pressure check.** / Every 1 to 2 years.  Lipid and cholesterol check.** / Every 5 years beginning at age 80.  Clinical breast exam.** / Every 3 years for women in their 66s and 71s.  BRCA-related cancer risk assessment.** / For women who have family members with a BRCA-related cancer (breast, ovarian, tubal, or peritoneal cancers).  Pap test.** / Every 2 years from ages 77 through 25. Every 3 years starting at age 58 through age 46 or 74 with a history of 3 consecutive normal Pap tests.  HPV screening.** / Every 3 years from ages 67 through ages 9 to 54 with a history of 3 consecutive normal Pap tests.  Hepatitis C blood test.** / For any individual with known risks for hepatitis C.  Skin self-exam. / Monthly.  Influenza vaccine. / Every year.  Tetanus, diphtheria, and acellular pertussis (Tdap, Td) vaccine.** / Consult your health care provider. Pregnant women should receive 1 dose of Tdap vaccine during each pregnancy. 1 dose of Td every 10 years.  Varicella vaccine.** / Consult your health care provider. Pregnant females who do not have evidence of immunity should receive the first dose after pregnancy.  HPV vaccine. / 3 doses over 6 months, if 57 and younger. The vaccine is not recommended for use in pregnant females. However, pregnancy testing is not needed before receiving a dose.  Measles, mumps, rubella (MMR) vaccine.** / You need  at least 1 dose of MMR if you were born in 1957 or later. You may also need a 2nd dose. For females of childbearing age, rubella immunity should be determined. If there is no evidence of immunity, females who are not pregnant should be vaccinated. If there is no evidence of immunity, females who are pregnant should delay immunization until after  pregnancy.  Pneumococcal 13-valent conjugate (PCV13) vaccine.** / Consult your health care provider.  Pneumococcal polysaccharide (PPSV23) vaccine.** / 1 to 2 doses if you smoke cigarettes or if you have certain conditions.  Meningococcal vaccine.** / 1 dose if you are age 27 to 78 years and a Market researcher living in a residence hall, or have one of several medical conditions, you need to get vaccinated against meningococcal disease. You may also need additional booster doses.  Hepatitis A vaccine.** / Consult your health care provider.  Hepatitis B vaccine.** / Consult your health care provider.  Haemophilus influenzae type b (Hib) vaccine.** / Consult your health care provider. Ages 86 to 75 years  Blood pressure check.** / Every 1 to 2 years.  Lipid and cholesterol check.** / Every 5 years beginning at age 69 years.  Lung cancer screening. / Every year if you are aged 28-80 years and have a 30-pack-year history of smoking and currently smoke or have quit within the past 15 years. Yearly screening is stopped once you have quit smoking for at least 15 years or develop a health problem that would prevent you from having lung cancer treatment.  Clinical breast exam.** / Every year after age 45 years.  BRCA-related cancer risk assessment.** / For women who have family members with a BRCA-related cancer (breast, ovarian, tubal, or peritoneal cancers).  Mammogram.** / Every year beginning at age 71 years and continuing for as long as you are in good health. Consult with your health care provider.  Pap test.** / Every 3 years starting at age 67 years through age 4 or 11 years with a history of 3 consecutive normal Pap tests.  HPV screening.** / Every 3 years from ages 8 years through ages 8 to 47 years with a history of 3 consecutive normal Pap tests.  Fecal occult blood test (FOBT) of stool. / Every year beginning at age 26 years and continuing until age 72 years. You may  not need to do this test if you get a colonoscopy every 10 years.  Flexible sigmoidoscopy or colonoscopy.** / Every 5 years for a flexible sigmoidoscopy or every 10 years for a colonoscopy beginning at age 38 years and continuing until age 12 years.  Hepatitis C blood test.** / For all people born from [birth year] through 1967 and any individual with known risks for hepatitis C.  Skin self-exam. / Monthly.  Influenza vaccine. / Every year.  Tetanus, diphtheria, and acellular pertussis (Tdap/Td) vaccine.** / Consult your health care provider. Pregnant women should receive 1 dose of Tdap vaccine during each pregnancy. 1 dose of Td every 10 years.  Varicella vaccine.** / Consult your health care provider. Pregnant females who do not have evidence of immunity should receive the first dose after pregnancy.  Zoster vaccine.** / 1 dose for adults aged 80 years or older.  Measles, mumps, rubella (MMR) vaccine.** / You need at least 1 dose of MMR if you were born in 1957 or later. You may also need a 2nd dose. For females of childbearing age, rubella immunity should be determined. If there is no evidence of immunity, females who  are not pregnant should be vaccinated. If there is no evidence of immunity, females who are pregnant should delay immunization until after pregnancy.  Pneumococcal 13-valent conjugate (PCV13) vaccine.** / Consult your health care provider.  Pneumococcal polysaccharide (PPSV23) vaccine.** / 1 to 2 doses if you smoke cigarettes or if you have certain conditions.  Meningococcal vaccine.** / Consult your health care provider.  Hepatitis A vaccine.** / Consult your health care provider.  Hepatitis B vaccine.** / Consult your health care provider.  Haemophilus influenzae type b (Hib) vaccine.** / Consult your health care provider. Ages 11 years and over  Blood pressure check.** / Every 1 to 2 years.  Lipid and cholesterol check.** / Every 5 years beginning at age 36 years.  Lung  cancer screening. / Every year if you are aged 73-80 years and have a 30-pack-year history of smoking and currently smoke or have quit within the past 15 years. Yearly screening is stopped once you have quit smoking for at least 15 years or develop a health problem that would prevent you from having lung cancer treatment.  Clinical breast exam.** / Every year after age 29 years.  BRCA-related cancer risk assessment.** / For women who have family members with a BRCA-related cancer (breast, ovarian, tubal, or peritoneal cancers).  Mammogram.** / Every year beginning at age 18 years and continuing for as long as you are in good health. Consult with your health care provider.  Pap test.** / Every 3 years starting at age 7 years through age 32 or 58 years with 3 consecutive normal Pap tests. Testing can be stopped between 65 and 70 years with 3 consecutive normal Pap tests and no abnormal Pap or HPV tests in the past 10 years.  HPV screening.** / Every 3 years from ages 33 years through ages 61 or 77 years with a history of 3 consecutive normal Pap tests. Testing can be stopped between 65 and 70 years with 3 consecutive normal Pap tests and no abnormal Pap or HPV tests in the past 10 years.  Fecal occult blood test (FOBT) of stool. / Every year beginning at age 27 years and continuing until age 34 years. You may not need to do this test if you get a colonoscopy every 10 years.  Flexible sigmoidoscopy or colonoscopy.** / Every 5 years for a flexible sigmoidoscopy or every 10 years for a colonoscopy beginning at age 60 years and continuing until age 59 years.  Hepatitis C blood test.** / For all people born from [birth year] through 1967 and any individual with known risks for hepatitis C.  Osteoporosis screening.** / A one-time screening for women ages 53 years and over and women at risk for fractures or osteoporosis.  Skin self-exam. / Monthly.  Influenza vaccine. / Every year.  Tetanus, diphtheria, and  acellular pertussis (Tdap/Td) vaccine.** / 1 dose of Td every 10 years.  Varicella vaccine.** / Consult your health care provider.  Zoster vaccine.** / 1 dose for adults aged 38 years or older.  Pneumococcal 13-valent conjugate (PCV13) vaccine.** / Consult your health care provider.  Pneumococcal polysaccharide (PPSV23) vaccine.** / 1 dose for all adults aged 8 years and older.  Meningococcal vaccine.** / Consult your health care provider.  Hepatitis A vaccine.** / Consult your health care provider.  Hepatitis B vaccine.** / Consult your health care provider.  Haemophilus influenzae type b (Hib) vaccine.** / Consult your health care provider. ** Family history and personal history of risk and conditions may change your health care provider's  recommendations. Document Released: 08/02/2001 Document Revised: 10/21/2013 Document Reviewed: 11/01/2010 [facility] Patient Information 2015 [place], [place]. This information is not intended to replace advice given to you by your health care provider. Make sure you discuss any questions you have with your health care provider.

## 2014-08-28 ENCOUNTER — Encounter: Payer: Self-pay | Admitting: Internal Medicine

## 2014-08-28 LAB — TSH: TSH: 3.07 u[IU]/mL (ref 0.35–4.50)

## 2014-08-28 NOTE — Assessment & Plan Note (Signed)
She has pre-diabetes and she agrees to work on her lifestyle modifications

## 2014-08-28 NOTE — Assessment & Plan Note (Signed)
Her BP is well controlled Lytes and renal function are stable 

## 2014-08-28 NOTE — Assessment & Plan Note (Signed)

## 2014-08-28 NOTE — Assessment & Plan Note (Signed)
LDL - C is mildly elevated She does not have any compelling risk factors for CAD and she is not willing to start a statin

## 2014-10-29 DIAGNOSIS — C8330 Diffuse large B-cell lymphoma, unspecified site: Secondary | ICD-10-CM | POA: Diagnosis not present

## 2014-10-29 DIAGNOSIS — C8590 Non-Hodgkin lymphoma, unspecified, unspecified site: Secondary | ICD-10-CM | POA: Diagnosis not present

## 2014-10-29 DIAGNOSIS — Z9481 Bone marrow transplant status: Secondary | ICD-10-CM | POA: Diagnosis not present

## 2014-10-30 DIAGNOSIS — C8330 Diffuse large B-cell lymphoma, unspecified site: Secondary | ICD-10-CM | POA: Diagnosis not present

## 2015-04-01 ENCOUNTER — Other Ambulatory Visit: Payer: Self-pay

## 2015-04-01 MED ORDER — LOSARTAN POTASSIUM-HCTZ 50-12.5 MG PO TABS: ORAL_TABLET | ORAL | Status: DC

## 2015-05-05 DIAGNOSIS — C8330 Diffuse large B-cell lymphoma, unspecified site: Secondary | ICD-10-CM | POA: Diagnosis not present

## 2015-05-05 DIAGNOSIS — Z9481 Bone marrow transplant status: Secondary | ICD-10-CM | POA: Diagnosis not present

## 2015-05-06 ENCOUNTER — Encounter: Payer: Self-pay | Admitting: Family

## 2015-05-06 ENCOUNTER — Ambulatory Visit (INDEPENDENT_AMBULATORY_CARE_PROVIDER_SITE_OTHER): Payer: Medicare Other | Admitting: Family

## 2015-05-06 VITALS — BP 182/92 | HR 81 | Temp 97.8°F | Resp 18 | Ht 66.0 in | Wt 165.0 lb

## 2015-05-06 DIAGNOSIS — I10 Essential (primary) hypertension: Secondary | ICD-10-CM | POA: Diagnosis not present

## 2015-05-06 MED ORDER — LOSARTAN POTASSIUM-HCTZ 100-25 MG PO TABS: 1.0000 | ORAL_TABLET | Freq: Every day | ORAL | Status: DC

## 2015-05-06 NOTE — Progress Notes (Signed)
   Subjective:    Patient ID: Jamie Villa, female    DOB: 27-Feb-1946, 69 y.o.   MRN: [id number]  Chief Complaint  Patient presents with  . Hypertension    went to [facility] and had high BP at her appt    HPI:  Jamie Villa is a 69 y.o. female who  has a past medical history of HTN (hypertension); Hypothyroid; Hiatal hernia; Non Hodgkin's lymphoma ([place]); Pneumonia; and colonic polyps. and presents today for an an acute office visit.   1.) Hypertension - Currently losartan-hydrochlorothiazide and carvedilol. Takes her blood pressure as prescribed and denies adverse side effects. Seen at [facility] yesterday for a follow up appointment and was noted to have a blood pressure of 181/96. Denies any symptoms of end organ damage. Does not currently take her blood pressure at home.   BP Readings from Last 3 Encounters:  05/06/15 182/92  08/27/14 144/80  06/04/13 158/90    Allergies  Allergen Reactions  . Codeine   . Penicillins     REACTION: Hives      Current Outpatient Prescriptions on File Prior to Visit  Medication Sig Dispense Refill  . carvedilol (COREG) 6.25 MG tablet Take 1 tablet (6.25 mg total) by mouth 2 (two) times daily with a meal. 180 tablet 3   No current facility-administered medications on file prior to visit.    Review of Systems  Eyes:       Negative for changes in vision  Respiratory: Negative for chest tightness.   Cardiovascular: Negative for chest pain, palpitations and leg swelling.      Objective:    BP 182/92 mmHg  Pulse 81  Temp(Src) 97.8 F (36.6 C) (Oral)  Resp 18  Ht 5\' 6"  (1.676 m)  Wt 165 lb (74.844 kg)  BMI 26.64 kg/m2  SpO2 97% Nursing note and vital signs reviewed.  Physical Exam  Constitutional: She is oriented to person, place, and time. She appears well-developed and well-nourished. No distress.  Cardiovascular: Normal rate, regular rhythm and intact distal pulses.   Murmur heard. Pulmonary/Chest: Effort normal and breath sounds  normal.  Neurological: She is alert and oriented to person, place, and time.  Skin: Skin is warm and dry.  Psychiatric: She has a normal mood and affect. Her behavior is normal. Judgment and thought content normal.       Assessment & Plan:   Problem List Items Addressed This Visit      Cardiovascular and Mediastinum   Essential hypertension - Primary    Blood pressure remains above goal of 140/90 on multiple occasions. Denies symptoms of end organ damage. Increase losartan hydrochlorothiazide. Encouraged to monitor blood pressure at home. Follow-up in 2 weeks for nurse visit to monitor blood pressure.      Relevant Medications   losartan-hydrochlorothiazide (HYZAAR) 100-25 MG tablet

## 2015-05-06 NOTE — Assessment & Plan Note (Signed)
Blood pressure remains above goal of 140/90 on multiple occasions. Denies symptoms of end organ damage. Increase losartan hydrochlorothiazide. Encouraged to monitor blood pressure at home. Follow-up in 2 weeks for nurse visit to monitor blood pressure.

## 2015-05-06 NOTE — Patient Instructions (Signed)
Thank you for choosing [facility].  Summary/Instructions:  If your symptoms worsen or fail to improve, please contact our office for further instruction, or in case of emergency go directly to the emergency room at the closest medical facility.    DASH Eating Plan DASH stands for "Dietary Approaches to Stop Hypertension." The DASH eating plan is a healthy eating plan that has been shown to reduce high blood pressure (hypertension). Additional health benefits may include reducing the risk of type 2 diabetes mellitus, heart disease, and stroke. The DASH eating plan may also help with weight loss. WHAT DO I NEED TO KNOW ABOUT THE DASH EATING PLAN? For the DASH eating plan, you will follow these general guidelines:  Choose foods with a percent daily value for sodium of less than 5% (as listed on the food label).  Use salt-free seasonings or herbs instead of table salt or sea salt.  Check with your health care provider or pharmacist before using salt substitutes.  Eat lower-sodium products, often labeled as "lower sodium" or "no salt added."  Eat fresh foods.  Eat more vegetables, fruits, and low-fat dairy products.  Choose whole grains. Look for the word "whole" as the first word in the ingredient list.  Choose fish and skinless chicken or [place] more often than red meat. Limit fish, poultry, and meat to 6 oz (170 g) each day.  Limit sweets, desserts, sugars, and sugary drinks.  Choose heart-healthy fats.  Limit cheese to 1 oz (28 g) per day.  Eat more home-cooked food and less restaurant, buffet, and fast food.  Limit fried foods.  Cook foods using methods other than frying.  Limit canned vegetables. If you do use them, rinse them well to decrease the sodium.  When eating at a restaurant, ask that your food be prepared with less salt, or no salt if possible. WHAT FOODS CAN I EAT? Seek help from a dietitian for individual calorie needs. Grains Whole grain or whole  wheat bread. Brown rice. Whole grain or whole wheat pasta. Quinoa, bulgur, and whole grain cereals. Low-sodium cereals. Corn or whole wheat flour tortillas. Whole grain cornbread. Whole grain crackers. Low-sodium crackers. Vegetables Fresh or frozen vegetables (raw, steamed, roasted, or grilled). Low-sodium or reduced-sodium tomato and vegetable juices. Low-sodium or reduced-sodium tomato sauce and paste. Low-sodium or reduced-sodium canned vegetables.  Fruits All fresh, canned (in natural juice), or frozen fruits. Meat and Other Protein Products Ground beef (85% or leaner), grass-fed beef, or beef trimmed of fat. Skinless chicken or [place]. Ground chicken or [place]. Pork trimmed of fat. All fish and seafood. Eggs. Dried beans, peas, or lentils. Unsalted nuts and seeds. Unsalted canned beans. Dairy Low-fat dairy products, such as skim or 1% milk, 2% or reduced-fat cheeses, low-fat ricotta or cottage cheese, or plain low-fat yogurt. Low-sodium or reduced-sodium cheeses. Fats and Oils Tub margarines without trans fats. Light or reduced-fat mayonnaise and salad dressings (reduced sodium). Avocado. Safflower, olive, or canola oils. Natural peanut or almond butter. Other Unsalted popcorn and pretzels. The items listed above may not be a complete list of recommended foods or beverages. Contact your dietitian for more options. WHAT FOODS ARE NOT RECOMMENDED? Grains White bread. White pasta. White rice. Refined cornbread. Bagels and croissants. Crackers that contain trans fat. Vegetables Creamed or fried vegetables. Vegetables in a cheese sauce. Regular canned vegetables. Regular canned tomato sauce and paste. Regular tomato and vegetable juices. Fruits Dried fruits. Canned fruit in light or heavy syrup. Fruit juice. Meat and Other Protein Products  Fatty cuts of meat. Ribs, chicken wings, bacon, sausage, bologna, salami, chitterlings, fatback, hot dogs, bratwurst, and packaged luncheon meats. Salted  nuts and seeds. Canned beans with salt. Dairy Whole or 2% milk, cream, half-and-half, and cream cheese. Whole-fat or sweetened yogurt. Full-fat cheeses or blue cheese. Nondairy creamers and whipped toppings. Processed cheese, cheese spreads, or cheese curds. Condiments Onion and garlic salt, seasoned salt, table salt, and sea salt. Canned and packaged gravies. Worcestershire sauce. Tartar sauce. Barbecue sauce. Teriyaki sauce. Soy sauce, including reduced sodium. Steak sauce. Fish sauce. Oyster sauce. Cocktail sauce. Horseradish. Ketchup and mustard. Meat flavorings and tenderizers. Bouillon cubes. Hot sauce. Tabasco sauce. Marinades. Taco seasonings. Relishes. Fats and Oils Butter, stick margarine, lard, shortening, ghee, and bacon fat. Coconut, palm kernel, or palm oils. Regular salad dressings. Other Pickles and olives. Salted popcorn and pretzels. The items listed above may not be a complete list of foods and beverages to avoid. Contact your dietitian for more information. WHERE CAN I FIND MORE INFORMATION? National Heart, Lung, and Blood Institute: travelstabloid.com   This information is not intended to replace advice given to you by your health care provider. Make sure you discuss any questions you have with your health care provider.   Document Released: 05/26/2011 Document Revised: 06/27/2014 Document Reviewed: 04/10/2013 Elsevier Interactive Patient Education [employer].

## 2015-05-06 NOTE — Progress Notes (Signed)
Pre visit review using our clinic review tool, if applicable. No additional management support is needed unless otherwise documented below in the visit note. 

## 2015-05-20 ENCOUNTER — Ambulatory Visit (INDEPENDENT_AMBULATORY_CARE_PROVIDER_SITE_OTHER): Payer: Medicare Other | Admitting: Internal Medicine

## 2015-05-20 ENCOUNTER — Other Ambulatory Visit (INDEPENDENT_AMBULATORY_CARE_PROVIDER_SITE_OTHER): Payer: Medicare Other

## 2015-05-20 ENCOUNTER — Encounter: Payer: Self-pay | Admitting: Internal Medicine

## 2015-05-20 VITALS — BP 158/90 | HR 84 | Temp 97.5°F | Resp 16 | Ht 66.0 in | Wt 164.0 lb

## 2015-05-20 DIAGNOSIS — R739 Hyperglycemia, unspecified: Secondary | ICD-10-CM

## 2015-05-20 DIAGNOSIS — IMO0001 Reserved for inherently not codable concepts without codable children: Secondary | ICD-10-CM

## 2015-05-20 DIAGNOSIS — E559 Vitamin D deficiency, unspecified: Secondary | ICD-10-CM

## 2015-05-20 DIAGNOSIS — I10 Essential (primary) hypertension: Secondary | ICD-10-CM | POA: Diagnosis not present

## 2015-05-20 DIAGNOSIS — R110 Nausea: Secondary | ICD-10-CM

## 2015-05-20 DIAGNOSIS — R209 Unspecified disturbances of skin sensation: Secondary | ICD-10-CM | POA: Diagnosis not present

## 2015-05-20 DIAGNOSIS — H8113 Benign paroxysmal vertigo, bilateral: Secondary | ICD-10-CM

## 2015-05-20 DIAGNOSIS — Z1159 Encounter for screening for other viral diseases: Secondary | ICD-10-CM

## 2015-05-20 DIAGNOSIS — E876 Hypokalemia: Secondary | ICD-10-CM

## 2015-05-20 DIAGNOSIS — Z1231 Encounter for screening mammogram for malignant neoplasm of breast: Secondary | ICD-10-CM

## 2015-05-20 DIAGNOSIS — H8110 Benign paroxysmal vertigo, unspecified ear: Secondary | ICD-10-CM | POA: Insufficient documentation

## 2015-05-20 LAB — HEMOGLOBIN A1C: Hgb A1c MFr Bld: 5.5 % (ref 4.6–6.5)

## 2015-05-20 LAB — URINALYSIS, ROUTINE W REFLEX MICROSCOPIC
BILIRUBIN URINE: NEGATIVE
HGB URINE DIPSTICK: NEGATIVE
Ketones, ur: NEGATIVE
NITRITE: NEGATIVE
RBC / HPF: NONE SEEN (ref 0–?)
Specific Gravity, Urine: 1.02 (ref 1.000–1.030)
Total Protein, Urine: NEGATIVE
Urine Glucose: NEGATIVE
Urobilinogen, UA: 0.2 (ref 0.0–1.0)
pH: 5 (ref 5.0–8.0)

## 2015-05-20 LAB — BASIC METABOLIC PANEL
BUN: 21 mg/dL (ref 6–23)
CHLORIDE: 103 meq/L (ref 96–112)
CO2: 33 mEq/L — ABNORMAL HIGH (ref 19–32)
Calcium: 9.8 mg/dL (ref 8.4–10.5)
Creatinine, Ser: 0.89 mg/dL (ref 0.40–1.20)
GFR: 66.42 mL/min (ref >60.00)
Glucose, Bld: 116 mg/dL — ABNORMAL HIGH (ref 70–99)
POTASSIUM: 3.4 meq/L — AB (ref 3.5–5.1)
SODIUM: 145 meq/L (ref 135–145)

## 2015-05-20 LAB — TSH: TSH: 3.81 u[IU]/mL (ref 0.35–4.50)

## 2015-05-20 MED ORDER — MECLIZINE HCL 25 MG PO TABS: 25.0000 mg | ORAL_TABLET | Freq: Three times a day (TID) | ORAL | Status: DC | PRN

## 2015-05-20 NOTE — Patient Instructions (Signed)

## 2015-05-20 NOTE — Progress Notes (Signed)
Pre visit review using our clinic review tool, if applicable. No additional management support is needed unless otherwise documented below in the visit note. 

## 2015-05-20 NOTE — Progress Notes (Signed)
Subjective:  Patient ID: Jamie Villa, female    DOB: 23-Mar-1946  Age: 69 y.o. MRN: [id number]  CC: Hypertension   HPI Deashley Purk Hilburn presents for a blood pressure check. She complains of a three-week history of intermittent nausea without vomiting. She also has dizziness when she lays down in the bed and turns her head and sometimes with standing. She said it is not vertigo but she has had vertigo before. She recently saw an oncologist at [facility] and was diagnosed with vitamin D deficiency.  Outpatient Prescriptions Prior to Visit  Medication Sig Dispense Refill  . carvedilol (COREG) 6.25 MG tablet Take 1 tablet (6.25 mg total) by mouth 2 (two) times daily with a meal. 180 tablet 3  . losartan-hydrochlorothiazide (HYZAAR) 100-25 MG tablet Take 1 tablet by mouth daily. 30 tablet 0   No facility-administered medications prior to visit.    ROS Review of Systems  Constitutional: Negative.  Negative for fever, chills, diaphoresis, appetite change and fatigue.  HENT: Negative.  Negative for trouble swallowing.   Eyes: Negative.  Negative for photophobia and visual disturbance.  Respiratory: Negative.  Negative for cough, choking, chest tightness, shortness of breath and stridor.   Cardiovascular: Negative.  Negative for chest pain, palpitations and leg swelling.  Gastrointestinal: Positive for nausea. Negative for vomiting, abdominal pain, diarrhea and constipation.  Endocrine: Negative.   Genitourinary: Negative.  Negative for difficulty urinating and dyspareunia.  Musculoskeletal: Negative.  Negative for myalgias, back pain and neck pain.  Skin: Negative.  Negative for color change and rash.  Allergic/Immunologic: Negative.   Neurological: Positive for dizziness and numbness (she complains of numbness and tingling in her hands and feet). Negative for tremors, seizures, syncope, facial asymmetry, speech difficulty, weakness, light-headedness and headaches.  Hematological: Negative.   Negative for adenopathy. Does not bruise/bleed easily.  Psychiatric/Behavioral: Negative.     Objective:  BP 158/90 mmHg  Pulse 84  Temp(Src) 97.5 F (36.4 C) (Oral)  Resp 16  Ht 5\' 6"  (1.676 m)  Wt 164 lb (74.39 kg)  BMI 26.48 kg/m2  SpO2 97%  BP Readings from Last 3 Encounters:  05/20/15 158/90  05/06/15 182/92  08/27/14 144/80    Wt Readings from Last 3 Encounters:  05/20/15 164 lb (74.39 kg)  05/06/15 165 lb (74.844 kg)  08/27/14 160 lb (72.576 kg)    Physical Exam  Constitutional: She is oriented to person, place, and time. No distress.  HENT:  Mouth/Throat: Oropharynx is clear and moist. No oropharyngeal exudate.  Eyes: Conjunctivae and EOM are normal. Pupils are equal, round, and reactive to light. Right eye exhibits no discharge. Left eye exhibits no discharge. No scleral icterus.  Neck: Normal range of motion. Neck supple. No JVD present. No tracheal deviation present. No thyromegaly present.  Cardiovascular: Normal rate, regular rhythm, normal heart sounds and intact distal pulses.  Exam reveals no gallop and no friction rub.   No murmur heard. Pulmonary/Chest: Effort normal and breath sounds normal. No stridor. No respiratory distress. She has no wheezes. She has no rales. She exhibits no tenderness.  Abdominal: Soft. Bowel sounds are normal. She exhibits no distension and no mass. There is no tenderness. There is no rebound and no guarding.  Musculoskeletal: Normal range of motion. She exhibits no edema or tenderness.  Neurological: She is alert and oriented to person, place, and time. She has normal strength. She displays no atrophy, no tremor and normal reflexes. No cranial nerve deficit or sensory deficit. She exhibits normal muscle  tone. She displays a negative Romberg sign. She displays no seizure activity. Coordination and gait normal. She displays no Babinski's sign on the right side. She displays no Babinski's sign on the left side.  Reflex Scores:       Tricep reflexes are 2+ on the right side and 2+ on the left side.      Bicep reflexes are 2+ on the right side and 2+ on the left side.      Brachioradialis reflexes are 2+ on the right side and 2+ on the left side.      Patellar reflexes are 1+ on the right side and 1+ on the left side.      Achilles reflexes are 1+ on the right side and 1+ on the left side. Skin: Skin is warm and dry. No rash noted. She is not diaphoretic. No erythema. No pallor.  Psychiatric: She has a normal mood and affect. Her behavior is normal. Judgment and thought content normal.  Vitals reviewed.   Lab Results  Component Value Date   WBC 5.6 08/27/2014   HGB 14.1 08/27/2014   HCT 40.9 08/27/2014   PLT 183.0 08/27/2014   GLUCOSE 116* 05/20/2015   CHOL 255* 08/27/2014   TRIG 255.0* 08/27/2014   HDL 69.00 08/27/2014   LDLDIRECT 153.0 08/27/2014   ALT 18 08/27/2014   AST 19 08/27/2014   NA 145 05/20/2015   K 3.4* 05/20/2015   CL 103 05/20/2015   CREATININE 0.89 05/20/2015   BUN 21 05/20/2015   CO2 33* 05/20/2015   TSH 3.81 05/20/2015   INR 0.90 10/14/2009   HGBA1C 5.5 05/20/2015    Dg Chest 2 View  05/30/2011  *RADIOLOGY REPORT* Clinical Data: Cough for 3 days, hypertension CHEST - 2 VIEW Comparison: Chest x-ray of 12/24/2009 Findings: No active infiltrate or effusion is seen.  Minimal peribronchial thickening is noted.  The heart is mildly enlarged and stable.  No acute bony abnormality is seen. IMPRESSION: Stable chest x-ray with borderline cardiomegaly.  No active lung disease. Original Report Authenticated By: Joretta Bachelor, M.D.   Assessment & Plan:   Kimala was seen today for hypertension.  Diagnoses and all orders for this visit:  Need for hepatitis C screening test -     Hepatitis C Antibody; Future  Visit for screening mammogram -     MM Digital Screening; Future  Vitamin D deficiency  Essential hypertension- her blood pressure is improving since her last visit. She does complain of  dizziness which may be orthostatics at this time I will not increase her antihypertensive regimen. Her labs today show hypokalemia so I will start treating that. -     TSH; Future -     Urinalysis, Routine w reflex microscopic (not at [facility]); Future -     potassium chloride SA (K-DUR,KLOR-CON) 20 MEQ tablet; Take 1 tablet (20 mEq total) by mouth daily.  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 She does have other neurological symptoms we'll get an MRI of her brain to see if there are any lesions to explain the symptoms. -     MR Brain Wo Contrast; Future  Nausea without vomiting- she has nausea and other neurological symptoms. I've ordered an MRI of her brain to see if she  has had a small CVA, see if there is any demyelination, and to screen for CNS mass. -     meclizine (ANTIVERT) 25 MG tablet; Take 1 tablet (25 mg total) by mouth 3 (three) times daily as needed for dizziness. -     MR Brain Wo Contrast; Future  BPPV (benign paroxysmal positional vertigo), bilateral- will start treating this with meclizine -     meclizine (ANTIVERT) 25 MG tablet; Take 1 tablet (25 mg total) by mouth 3 (three) times daily as needed for dizziness. -     MR Brain Wo Contrast; Future  Hypokalemia- this is caused by hydrochlorothiazide, will treat this with 20 mEq of KCl a day. -     potassium chloride SA (K-DUR,KLOR-CON) 20 MEQ tablet; Take 1 tablet (20 mEq total) by mouth daily.  I am having Ms. Hilburn start on meclizine and potassium chloride SA. I am also having her maintain her carvedilol, losartan-hydrochlorothiazide, Vitamin D (Ergocalciferol), and Cholecalciferol.  Meds ordered this encounter  Medications  . Vitamin D, Ergocalciferol, (DRISDOL) 50000 UNITS  CAPS capsule    Sig: Take by mouth.  . Cholecalciferol 50000 UNITS TABS    Sig: Take 1 tablet by mouth once a week.    Dispense:  12 tablet    Refill:  3  . meclizine (ANTIVERT) 25 MG tablet    Sig: Take 1 tablet (25 mg total) by mouth 3 (three) times daily as needed for dizziness.    Dispense:  65 tablet    Refill:  1  . potassium chloride SA (K-DUR,KLOR-CON) 20 MEQ tablet    Sig: Take 1 tablet (20 mEq total) by mouth daily.    Dispense:  30 tablet    Refill:  11     Follow-up: Return in about 3 weeks (around 06/10/2015).  Scarlette Calico, MD

## 2015-05-21 ENCOUNTER — Encounter: Payer: Self-pay | Admitting: Internal Medicine

## 2015-05-21 DIAGNOSIS — T502X5A Adverse effect of carbonic-anhydrase inhibitors, benzothiadiazides and other diuretics, initial encounter: Secondary | ICD-10-CM

## 2015-05-21 DIAGNOSIS — E876 Hypokalemia: Secondary | ICD-10-CM | POA: Insufficient documentation

## 2015-05-21 LAB — HEPATITIS C ANTIBODY: HCV AB: NEGATIVE

## 2015-05-21 MED ORDER — POTASSIUM CHLORIDE CRYS ER 20 MEQ PO TBCR: 20.0000 meq | EXTENDED_RELEASE_TABLET | Freq: Every day | ORAL | Status: DC

## 2015-05-28 ENCOUNTER — Other Ambulatory Visit: Payer: Self-pay | Admitting: Family

## 2015-06-23 ENCOUNTER — Ambulatory Visit (INDEPENDENT_AMBULATORY_CARE_PROVIDER_SITE_OTHER): Payer: Medicare Other | Admitting: Internal Medicine

## 2015-06-23 ENCOUNTER — Encounter: Payer: Self-pay | Admitting: Internal Medicine

## 2015-06-23 VITALS — BP 138/88 | HR 81 | Temp 97.9°F | Resp 16 | Ht 66.0 in | Wt 164.0 lb

## 2015-06-23 DIAGNOSIS — I10 Essential (primary) hypertension: Secondary | ICD-10-CM | POA: Diagnosis not present

## 2015-06-23 DIAGNOSIS — H8110 Benign paroxysmal vertigo, unspecified ear: Secondary | ICD-10-CM | POA: Diagnosis not present

## 2015-06-23 NOTE — Assessment & Plan Note (Signed)
Her symptoms have resolved She will go ahead and have the MRI done

## 2015-06-23 NOTE — Assessment & Plan Note (Signed)
Her BP is well controlled 

## 2015-06-23 NOTE — Progress Notes (Signed)
Subjective:  Patient ID: Jamie Villa, female    DOB: 1946-01-17  Age: 70 y.o. MRN: [id number]  CC: Hypertension   HPI Jamie Villa presents for a BP check - she feels well and offers no complaints. All of her prior symptoms have resolved.  Outpatient Prescriptions Prior to Visit  Medication Sig Dispense Refill  . carvedilol (COREG) 6.25 MG tablet Take 1 tablet (6.25 mg total) by mouth 2 (two) times daily with a meal. 180 tablet 3  . Cholecalciferol 50000 UNITS TABS Take 1 tablet by mouth once a week. 12 tablet 3  . losartan-hydrochlorothiazide (HYZAAR) 100-25 MG tablet TAKE 1 TABLET BY MOUTH DAILY. 90 tablet 0  . meclizine (ANTIVERT) 25 MG tablet Take 1 tablet (25 mg total) by mouth 3 (three) times daily as needed for dizziness. 65 tablet 1  . potassium chloride SA (K-DUR,KLOR-CON) 20 MEQ tablet Take 1 tablet (20 mEq total) by mouth daily. 30 tablet 11   No facility-administered medications prior to visit.    ROS Review of Systems  Constitutional: Negative.  Negative for fever, chills, diaphoresis, appetite change and fatigue.  HENT: Negative.   Eyes: Negative.   Respiratory: Negative.  Negative for cough, choking, chest tightness, shortness of breath and stridor.   Cardiovascular: Negative.  Negative for chest pain, palpitations and leg swelling.  Gastrointestinal: Negative.  Negative for nausea, abdominal pain, diarrhea, constipation and blood in stool.  Endocrine: Negative.   Genitourinary: Negative.  Negative for dysuria, hematuria and difficulty urinating.  Musculoskeletal: Negative.  Negative for myalgias, back pain, joint swelling and arthralgias.  Skin: Negative.  Negative for color change.  Allergic/Immunologic: Negative.   Neurological: Negative.  Negative for dizziness, syncope, speech difficulty, weakness, light-headedness and headaches.  Hematological: Negative.  Negative for adenopathy. Does not bruise/bleed easily.  Psychiatric/Behavioral: Negative.      Objective:  BP 138/88 mmHg  Pulse 81  Temp(Src) 97.9 F (36.6 C) (Oral)  Resp 16  Ht 5\' 6"  (1.676 m)  Wt 164 lb (74.39 kg)  BMI 26.48 kg/m2  SpO2 95%  BP Readings from Last 3 Encounters:  06/23/15 138/88  05/20/15 158/90  05/06/15 182/92    Wt Readings from Last 3 Encounters:  06/23/15 164 lb (74.39 kg)  05/20/15 164 lb (74.39 kg)  05/06/15 165 lb (74.844 kg)    Physical Exam  Constitutional: She is oriented to person, place, and time. No distress.  HENT:  Mouth/Throat: Oropharynx is clear and moist. No oropharyngeal exudate.  Eyes: Conjunctivae are normal. Right eye exhibits no discharge. Left eye exhibits no discharge. No scleral icterus.  Neck: Normal range of motion. Neck supple. No JVD present. No tracheal deviation present. No thyromegaly present.  Cardiovascular: Normal rate, regular rhythm, normal heart sounds and intact distal pulses.  Exam reveals no gallop and no friction rub.   No murmur heard. Pulmonary/Chest: Effort normal and breath sounds normal. No stridor. No respiratory distress. She has no wheezes. She has no rales. She exhibits no tenderness.  Abdominal: Soft. Bowel sounds are normal. She exhibits no distension and no mass. There is no tenderness. There is no rebound and no guarding.  Musculoskeletal: Normal range of motion. She exhibits no edema or tenderness.  Lymphadenopathy:    She has no cervical adenopathy.  Neurological: She is oriented to person, place, and time.  Skin: Skin is warm and dry. No rash noted. She is not diaphoretic. No erythema. No pallor.    Lab Results  Component Value Date   WBC  5.6 08/27/2014   HGB 14.1 08/27/2014   HCT 40.9 08/27/2014   PLT 183.0 08/27/2014   GLUCOSE 116* 05/20/2015   CHOL 255* 08/27/2014   TRIG 255.0* 08/27/2014   HDL 69.00 08/27/2014   LDLDIRECT 153.0 08/27/2014   ALT 18 08/27/2014   AST 19 08/27/2014   NA 145 05/20/2015   K 3.4* 05/20/2015   CL 103 05/20/2015   CREATININE 0.89 05/20/2015    BUN 21 05/20/2015   CO2 33* 05/20/2015   TSH 3.81 05/20/2015   INR 0.90 10/14/2009   HGBA1C 5.5 05/20/2015    Dg Chest 2 View  05/30/2011  *RADIOLOGY REPORT* Clinical Data: Cough for 3 days, hypertension CHEST - 2 VIEW Comparison: Chest x-ray of 12/24/2009 Findings: No active infiltrate or effusion is seen.  Minimal peribronchial thickening is noted.  The heart is mildly enlarged and stable.  No acute bony abnormality is seen. IMPRESSION: Stable chest x-ray with borderline cardiomegaly.  No active lung disease. Original Report Authenticated By: Joretta Bachelor, M.D.   Assessment & Plan:   Jamie Villa was seen today for hypertension.  Diagnoses and all orders for this visit:  Essential hypertension  BPPV (benign paroxysmal positional vertigo), unspecified laterality  I am having Jamie Villa maintain her carvedilol, Cholecalciferol, meclizine, potassium chloride SA, and losartan-hydrochlorothiazide.  No orders of the defined types were placed in this encounter.     Follow-up: Return in about 6 months (around 12/21/2015).  Jamie Calico, MD

## 2015-06-23 NOTE — Progress Notes (Signed)
Pre visit review using our clinic review tool, if applicable. No additional management support is needed unless otherwise documented below in the visit note. 

## 2015-06-23 NOTE — Patient Instructions (Signed)
Hypertension Hypertension, commonly called high blood pressure, is when the force of blood pumping through your arteries is too strong. Your arteries are the blood vessels that carry blood from your heart throughout your body. A blood pressure reading consists of a higher number over a lower number, such as 110/72. The higher number (systolic) is the pressure inside your arteries when your heart pumps. The lower number (diastolic) is the pressure inside your arteries when your heart relaxes. Ideally you want your blood pressure below 120/80. Hypertension forces your heart to work harder to pump blood. Your arteries may become narrow or stiff. Having untreated or uncontrolled hypertension can cause heart attack, stroke, kidney disease, and other problems. RISK FACTORS Some risk factors for high blood pressure are controllable. Others are not.  Risk factors you cannot control include:   Race. You may be at higher risk if you are African American.  Age. Risk increases with age.  Gender. Men are at higher risk than women before age 45 years. After age 65, women are at higher risk than men. Risk factors you can control include:  Not getting enough exercise or physical activity.  Being overweight.  Getting too much fat, sugar, calories, or salt in your diet.  Drinking too much alcohol. SIGNS AND SYMPTOMS Hypertension does not usually cause signs or symptoms. Extremely high blood pressure (hypertensive crisis) may cause headache, anxiety, shortness of breath, and nosebleed. DIAGNOSIS To check if you have hypertension, your health care provider will measure your blood pressure while you are seated, with your arm held at the level of your heart. It should be measured at least twice using the same arm. Certain conditions can cause a difference in blood pressure between your right and left arms. A blood pressure reading that is higher than normal on one occasion does not mean that you need treatment. If  it is not clear whether you have high blood pressure, you may be asked to return on a different day to have your blood pressure checked again. Or, you may be asked to monitor your blood pressure at home for 1 or more weeks. TREATMENT Treating high blood pressure includes making lifestyle changes and possibly taking medicine. Living a healthy lifestyle can help lower high blood pressure. You may need to change some of your habits. Lifestyle changes may include:  Following the DASH diet. This diet is high in fruits, vegetables, and whole grains. It is low in salt, red meat, and added sugars.  Keep your sodium intake below 2,300 mg per day.  Getting at least 30-45 minutes of aerobic exercise at least 4 times per week.  Losing weight if necessary.  Not smoking.  Limiting alcoholic beverages.  Learning ways to reduce stress. Your health care provider may prescribe medicine if lifestyle changes are not enough to get your blood pressure under control, and if one of the following is true:  You are 18-59 years of age and your systolic blood pressure is above 140.  You are 60 years of age or older, and your systolic blood pressure is above 150.  Your diastolic blood pressure is above 90.  You have diabetes, and your systolic blood pressure is over 140 or your diastolic blood pressure is over 90.  You have kidney disease and your blood pressure is above 140/90.  You have heart disease and your blood pressure is above 140/90. Your personal target blood pressure may vary depending on your medical conditions, your age, and other factors. HOME CARE INSTRUCTIONS    Have your blood pressure rechecked as directed by your health care provider.   Take medicines only as directed by your health care provider. Follow the directions carefully. Blood pressure medicines must be taken as prescribed. The medicine does not work as well when you skip doses. Skipping doses also puts you at risk for  problems.  Do not smoke.   Monitor your blood pressure at home as directed by your health care provider. SEEK MEDICAL CARE IF:   You think you are having a reaction to medicines taken.  You have recurrent headaches or feel dizzy.  You have swelling in your ankles.  You have trouble with your vision. SEEK IMMEDIATE MEDICAL CARE IF:  You develop a severe headache or confusion.  You have unusual weakness, numbness, or feel faint.  You have severe chest or abdominal pain.  You vomit repeatedly.  You have trouble breathing. MAKE SURE YOU:   Understand these instructions.  Will watch your condition.  Will get help right away if you are not doing well or get worse.   This information is not intended to replace advice given to you by your health care provider. Make sure you discuss any questions you have with your health care provider.   Document Released: 06/06/2005 Document Revised: 10/21/2014 Document Reviewed: 03/29/2013 Elsevier Interactive Patient Education 2016 [employer]  

## 2015-06-25 DIAGNOSIS — Z803 Family history of malignant neoplasm of breast: Secondary | ICD-10-CM | POA: Diagnosis not present

## 2015-06-25 DIAGNOSIS — Z1231 Encounter for screening mammogram for malignant neoplasm of breast: Secondary | ICD-10-CM | POA: Diagnosis not present

## 2015-06-25 LAB — HM MAMMOGRAPHY

## 2015-06-30 ENCOUNTER — Ambulatory Visit
Admission: RE | Admit: 2015-06-30 | Discharge: 2015-06-30 | Disposition: A | Discharge status: Home / Self Care | Payer: Medicare Other | Source: Ambulatory Visit | Attending: Internal Medicine | Admitting: Internal Medicine

## 2015-06-30 ENCOUNTER — Other Ambulatory Visit: Payer: Medicare Other

## 2015-06-30 DIAGNOSIS — R42 Dizziness and giddiness: Secondary | ICD-10-CM | POA: Diagnosis not present

## 2015-06-30 DIAGNOSIS — R209 Unspecified disturbances of skin sensation: Principal | ICD-10-CM

## 2015-06-30 DIAGNOSIS — IMO0001 Reserved for inherently not codable concepts without codable children: Secondary | ICD-10-CM

## 2015-06-30 DIAGNOSIS — H8113 Benign paroxysmal vertigo, bilateral: Secondary | ICD-10-CM

## 2015-06-30 DIAGNOSIS — R110 Nausea: Secondary | ICD-10-CM

## 2015-06-30 NOTE — Addendum Note (Signed)
Addended by: [name] on: 06/30/2015 10:24 AM   Modules accepted: Miquel Dunn

## 2015-07-21 ENCOUNTER — Encounter: Payer: Self-pay | Admitting: Internal Medicine

## 2015-07-21 ENCOUNTER — Ambulatory Visit (INDEPENDENT_AMBULATORY_CARE_PROVIDER_SITE_OTHER): Payer: Medicare Other | Admitting: Internal Medicine

## 2015-07-21 VITALS — BP 144/98 | HR 82 | Wt 163.0 lb

## 2015-07-21 DIAGNOSIS — I10 Essential (primary) hypertension: Secondary | ICD-10-CM

## 2015-07-21 DIAGNOSIS — H8112 Benign paroxysmal vertigo, left ear: Secondary | ICD-10-CM | POA: Diagnosis not present

## 2015-07-21 DIAGNOSIS — E559 Vitamin D deficiency, unspecified: Secondary | ICD-10-CM | POA: Diagnosis not present

## 2015-07-21 MED ORDER — ONDANSETRON HCL 4 MG PO TABS: 4.0000 mg | ORAL_TABLET | Freq: Three times a day (TID) | ORAL | Status: DC | PRN

## 2015-07-21 MED ORDER — CHOLECALCIFEROL 1.25 MG (50000 UT) PO TABS: 1.0000 | ORAL_TABLET | ORAL | Status: DC

## 2015-07-21 MED ORDER — METHYLPREDNISOLONE ACETATE 80 MG/ML IJ SUSP
80.0000 mg | Freq: Once | INTRAMUSCULAR | Status: AC
  Administered 2015-07-22: 80 mg via INTRAMUSCULAR

## 2015-07-21 NOTE — Progress Notes (Signed)
Pre visit review using our clinic review tool, if applicable. No additional management support is needed unless otherwise documented below in the visit note. 

## 2015-07-21 NOTE — Progress Notes (Signed)
Subjective:  Patient ID: Jamie Villa, female    DOB: [birth year]  Age: 70 y.o. MRN: [id number]  CC: No chief complaint on file.   HPI Jamie Villa presents for recurrent vertigo w/nausea since 07/20/15. Pt had a similar episode in Nov 2017.   Outpatient Prescriptions Prior to Visit  Medication Sig Dispense Refill  . carvedilol (COREG) 6.25 MG tablet Take 1 tablet (6.25 mg total) by mouth 2 (two) times daily with a meal. 180 tablet 3  . losartan-hydrochlorothiazide (HYZAAR) 100-25 MG tablet TAKE 1 TABLET BY MOUTH DAILY. 90 tablet 0  . meclizine (ANTIVERT) 25 MG tablet Take 1 tablet (25 mg total) by mouth 3 (three) times daily as needed for dizziness. 65 tablet 1  . potassium chloride SA (K-DUR,KLOR-CON) 20 MEQ tablet Take 1 tablet (20 mEq total) by mouth daily. 30 tablet 11  . Cholecalciferol 50000 UNITS TABS Take 1 tablet by mouth once a week. 12 tablet 3   No facility-administered medications prior to visit.    ROS Review of Systems  Constitutional: Negative for chills, activity change, appetite change, fatigue and unexpected weight change.  HENT: Negative for congestion, mouth sores and sinus pressure.   Eyes: Negative for visual disturbance.  Respiratory: Negative for cough and chest tightness.   Gastrointestinal: Positive for nausea. Negative for abdominal pain.  Genitourinary: Negative for frequency, difficulty urinating and vaginal pain.  Musculoskeletal: Negative for back pain and gait problem.  Skin: Negative for pallor and rash.  Neurological: Positive for dizziness. Negative for tremors, weakness, numbness and headaches.  Psychiatric/Behavioral: Negative for confusion and sleep disturbance.    Objective:  BP 144/98 mmHg  Pulse 82  Wt 163 lb (73.936 kg)  SpO2 97%  BP Readings from Last 3 Encounters:  07/21/15 144/98  06/23/15 138/88  05/20/15 158/90    Wt Readings from Last 3 Encounters:  07/21/15 163 lb (73.936 kg)  06/30/15 150 lb (68.04 kg)    06/23/15 164 lb (74.39 kg)    Physical Exam  Constitutional: She appears well-developed. No distress.  HENT:  Head: Normocephalic.  Right Ear: External ear normal.  Left Ear: External ear normal.  Nose: Nose normal.  Mouth/Throat: Oropharynx is clear and moist.  Eyes: Conjunctivae are normal. Pupils are equal, round, and reactive to light. Right eye exhibits no discharge. Left eye exhibits no discharge.  Neck: Normal range of motion. Neck supple. No JVD present. No tracheal deviation present. No thyromegaly present.  Cardiovascular: Normal rate, regular rhythm and normal heart sounds.   Pulmonary/Chest: No stridor. No respiratory distress. She has no wheezes.  Abdominal: Soft. Bowel sounds are normal. She exhibits no distension and no mass. There is no tenderness. There is no rebound and no guarding.  Musculoskeletal: She exhibits no edema or tenderness.  Lymphadenopathy:    She has no cervical adenopathy.  Neurological: She displays normal reflexes. No cranial nerve deficit. She exhibits normal muscle tone. Coordination normal.  Skin: No rash noted. No erythema.  Psychiatric: She has a normal mood and affect. Her behavior is normal. Judgment and thought content normal.  H-P (+) on L  Lab Results  Component Value Date   WBC 5.6 08/27/2014   HGB 14.1 08/27/2014   HCT 40.9 08/27/2014   PLT 183.0 08/27/2014   GLUCOSE 116* 05/20/2015   CHOL 255* 08/27/2014   TRIG 255.0* 08/27/2014   HDL 69.00 08/27/2014   LDLDIRECT 153.0 08/27/2014   ALT 18 08/27/2014   AST 19 08/27/2014   NA 145 05/20/2015  K 3.4* 05/20/2015   CL 103 05/20/2015   CREATININE 0.89 05/20/2015   BUN 21 05/20/2015   CO2 33* 05/20/2015   TSH 3.81 05/20/2015   INR 0.90 10/14/2009   HGBA1C 5.5 05/20/2015    Mr Brain Wo Contrast  06/30/2015  CLINICAL DATA:  70 year old female with dizziness and nausea for 2 months, loss of balance. Initial encounter. 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 EXAM: MRI HEAD WITHOUT CONTRAST TECHNIQUE: Multiplanar, multiecho pulse sequences of the brain and surrounding structures were obtained without intravenous contrast. COMPARISON:  Neck CT 05/12/2008. FINDINGS: Cerebral volume is within normal limits for age. No restricted diffusion to suggest acute infarction. No midline shift, mass effect, evidence of mass lesion, ventriculomegaly, extra-axial collection or acute intracranial hemorrhage. Cervicomedullary junction and pituitary are within normal limits. Major intracranial vascular flow voids are within normal limits, dominant left vertebral artery re- demonstrated. Scattered mild for age nonspecific cerebral white matter T2 and FLAIR hyperintensity. No cortical encephalomalacia or chronic cerebral blood products. Deep gray matter nuclei, brainstem, and cerebellum are within normal limits. Visible internal auditory structures appear normal. Trace mastoid fluid on the left, appears inconsequential. Negative nasopharynx. Normal stylomastoid foramina. Paranasal sinuses are clear. Postoperative changes to both globes. Negative orbit and scalp soft tissues. Normal bone marrow signal. Negative visualized cervical spine. IMPRESSION: No acute intracranial abnormality and largely unremarkable for age noncontrast MRI appearance of the brain. Electronically Signed   By: Genevie Ann M.D.   On: 06/30/2015 16:36    Assessment & Plan:   Diagnoses and all orders for this visit:  Vitamin D deficiency -     Cholecalciferol 50000 units TABS; Take 1 tablet by mouth once a week.  BPPV (benign paroxysmal positional vertigo), left  Other orders -     Discontinue: ondansetron (ZOFRAN) 4 MG tablet; Take 1 tablet (4 mg total) by mouth every 8 (eight) hours as needed for nausea or vomiting. -     ondansetron (ZOFRAN) 4 MG tablet; Take 1 tablet (4 mg total) by mouth every 8 (eight) hours as needed for nausea or vomiting. -     methylPREDNISolone acetate (DEPO-MEDROL) injection  80 mg; Inject 1 mL (80 mg total) into the muscle once.   I am having [name] maintain her carvedilol, meclizine, potassium chloride SA, losartan-hydrochlorothiazide, Cholecalciferol, and ondansetron.  Meds ordered this encounter  Medications  . Cholecalciferol 50000 units TABS    Sig: Take 1 tablet by mouth once a week.    Dispense:  12 tablet    Refill:  3  . DISCONTD: ondansetron (ZOFRAN) 4 MG tablet    Sig: Take 1 tablet (4 mg total) by mouth every 8 (eight) hours as needed for nausea or vomiting.    Dispense:  20 tablet    Refill:  0  . ondansetron (ZOFRAN) 4 MG tablet    Sig: Take 1 tablet (4 mg total) by mouth every 8 (eight) hours as needed for nausea or vomiting.    Dispense:  20 tablet    Refill:  0     Follow-up: No Follow-up on file.  Walker Kehr, MD

## 2015-07-21 NOTE — Patient Instructions (Addendum)
Benign Positional Vertigo symptoms on the left. Start Meclizine. Start Brandt - Daroff exercise several times a day as dirrected. No driving if dizzy 

## 2015-07-21 NOTE — Assessment & Plan Note (Signed)
On Coreg Losartan HCT

## 2015-07-21 NOTE — Assessment & Plan Note (Signed)
Benign Positional Vertigo symptoms on the left. Start Meclizine. Start Brandt - Daroff exercise several times a day as dirrected. 

## 2015-08-26 ENCOUNTER — Telehealth: Payer: Self-pay | Admitting: Internal Medicine

## 2015-08-26 DIAGNOSIS — I10 Essential (primary) hypertension: Secondary | ICD-10-CM

## 2015-08-26 MED ORDER — LOSARTAN POTASSIUM-HCTZ 100-25 MG PO TABS: 1.0000 | ORAL_TABLET | Freq: Every day | ORAL | Status: DC

## 2015-08-26 MED ORDER — CARVEDILOL 6.25 MG PO TABS: 6.2500 mg | ORAL_TABLET | Freq: Two times a day (BID) | ORAL | Status: DC

## 2015-08-26 NOTE — Telephone Encounter (Signed)
Pt had to move her physical date and she is needing refills for losartan-hydrochlorothiazide (HYZAAR) 100-25 MG tablet [id number] and carvedilol (COREG) 6.25 MG tablet [id number] Pharmacy is [facility] on [address]

## 2015-08-26 NOTE — Telephone Encounter (Signed)
Done

## 2015-09-03 ENCOUNTER — Encounter: Payer: Medicare Other | Admitting: Internal Medicine

## 2015-09-10 DIAGNOSIS — H04123 Dry eye syndrome of bilateral lacrimal glands: Secondary | ICD-10-CM | POA: Diagnosis not present

## 2015-09-10 DIAGNOSIS — Z961 Presence of intraocular lens: Secondary | ICD-10-CM | POA: Diagnosis not present

## 2015-09-10 DIAGNOSIS — H40013 Open angle with borderline findings, low risk, bilateral: Secondary | ICD-10-CM | POA: Diagnosis not present

## 2015-09-10 DIAGNOSIS — H35373 Puckering of macula, bilateral: Secondary | ICD-10-CM | POA: Diagnosis not present

## 2015-09-16 ENCOUNTER — Ambulatory Visit (INDEPENDENT_AMBULATORY_CARE_PROVIDER_SITE_OTHER): Payer: Medicare Other | Admitting: Internal Medicine

## 2015-09-16 ENCOUNTER — Encounter: Payer: Self-pay | Admitting: Internal Medicine

## 2015-09-16 ENCOUNTER — Other Ambulatory Visit (INDEPENDENT_AMBULATORY_CARE_PROVIDER_SITE_OTHER): Payer: Medicare Other

## 2015-09-16 VITALS — BP 138/80 | HR 73 | Temp 97.8°F | Resp 16 | Ht 66.0 in | Wt 166.0 lb

## 2015-09-16 DIAGNOSIS — Z0000 Encounter for general adult medical examination without abnormal findings: Secondary | ICD-10-CM | POA: Diagnosis not present

## 2015-09-16 DIAGNOSIS — I10 Essential (primary) hypertension: Secondary | ICD-10-CM | POA: Diagnosis not present

## 2015-09-16 DIAGNOSIS — E876 Hypokalemia: Secondary | ICD-10-CM

## 2015-09-16 DIAGNOSIS — E7800 Pure hypercholesterolemia, unspecified: Secondary | ICD-10-CM

## 2015-09-16 DIAGNOSIS — R739 Hyperglycemia, unspecified: Secondary | ICD-10-CM

## 2015-09-16 LAB — LIPID PANEL
Cholesterol: 247 mg/dL — ABNORMAL HIGH (ref 0–200)
HDL: 70.6 mg/dL (ref >39.00)
LDL Cholesterol: 147 mg/dL — ABNORMAL HIGH (ref 0–99)
NONHDL: 176.87
Total CHOL/HDL Ratio: 4
Triglycerides: 148 mg/dL (ref 0.0–149.0)
VLDL: 29.6 mg/dL (ref 0.0–40.0)

## 2015-09-16 LAB — BASIC METABOLIC PANEL
BUN: 25 mg/dL — AB (ref 6–23)
CALCIUM: 9.9 mg/dL (ref 8.4–10.5)
CO2: 33 mEq/L — ABNORMAL HIGH (ref 19–32)
CREATININE: 0.84 mg/dL (ref 0.40–1.20)
Chloride: 105 mEq/L (ref 96–112)
GFR: 70.93 mL/min (ref >60.00)
GLUCOSE: 96 mg/dL (ref 70–99)
Potassium: 3.5 mEq/L (ref 3.5–5.1)
Sodium: 146 mEq/L — ABNORMAL HIGH (ref 135–145)

## 2015-09-16 LAB — TSH: TSH: 4.06 u[IU]/mL (ref 0.35–4.50)

## 2015-09-16 LAB — HEMOGLOBIN A1C: Hgb A1c MFr Bld: 5.8 % (ref 4.6–6.5)

## 2015-09-16 LAB — MAGNESIUM: MAGNESIUM: 2.2 mg/dL (ref 1.5–2.5)

## 2015-09-16 NOTE — Progress Notes (Signed)
Pre visit review using our clinic review tool, if applicable. No additional management support is needed unless otherwise documented below in the visit note. 

## 2015-09-16 NOTE — Patient Instructions (Signed)
Preventive Care for Adults, Female A healthy lifestyle and preventive care can promote health and wellness. Preventive health guidelines for women include the following key practices.  A routine yearly physical is a good way to check with your health care provider about your health and preventive screening. It is a chance to share any concerns and updates on your health and to receive a thorough exam.  Visit your dentist for a routine exam and preventive care every 6 months. Brush your teeth twice a day and floss once a day. Good oral hygiene prevents tooth decay and gum disease.  The frequency of eye exams is based on your age, health, family medical history, use of contact lenses, and other factors. Follow your health care provider's recommendations for frequency of eye exams.  Eat a healthy diet. Foods like vegetables, fruits, whole grains, low-fat dairy products, and lean protein foods contain the nutrients you need without too many calories. Decrease your intake of foods high in solid fats, added sugars, and salt. Eat the right amount of calories for you.Get information about a proper diet from your health care provider, if necessary.  Regular physical exercise is one of the most important things you can do for your health. Most adults should get at least 150 minutes of moderate-intensity exercise (any activity that increases your heart rate and causes you to sweat) each week. In addition, most adults need muscle-strengthening exercises on 2 or more days a week.  Maintain a healthy weight. The body mass index (BMI) is a screening tool to identify possible weight problems. It provides an estimate of body fat based on height and weight. Your health care provider can find your BMI and can help you achieve or maintain a healthy weight.For adults 20 years and older:  A BMI below 18.5 is considered underweight.  A BMI of 18.5 to 24.9 is normal.  A BMI of 25 to 29.9 is considered overweight.  A  BMI of 30 and above is considered obese.  Maintain normal blood lipids and cholesterol levels by exercising and minimizing your intake of saturated fat. Eat a balanced diet with plenty of fruit and vegetables. Blood tests for lipids and cholesterol should begin at age 45 and be repeated every 5 years. If your lipid or cholesterol levels are high, you are over 50, or you are at high risk for heart disease, you may need your cholesterol levels checked more frequently.Ongoing high lipid and cholesterol levels should be treated with medicines if diet and exercise are not working.  If you smoke, find out from your health care provider how to quit. If you do not use tobacco, do not start.  Lung cancer screening is recommended for adults aged 45-80 years who are at high risk for developing lung cancer because of a history of smoking. A yearly low-dose CT scan of the lungs is recommended for people who have at least a 30-pack-year history of smoking and are a current smoker or have quit within the past 15 years. A pack year of smoking is smoking an average of 1 pack of cigarettes a day for 1 year (for example: 1 pack a day for 30 years or 2 packs a day for 15 years). Yearly screening should continue until the smoker has stopped smoking for at least 15 years. Yearly screening should be stopped for people who develop a health problem that would prevent them from having lung cancer treatment.  If you are pregnant, do not drink alcohol. If you are  breastfeeding, be very cautious about drinking alcohol. If you are not pregnant and choose to drink alcohol, do not have more than 1 drink per day. One drink is considered to be 12 ounces (355 mL) of beer, 5 ounces (148 mL) of wine, or 1.5 ounces (44 mL) of liquor.  Avoid use of street drugs. Do not share needles with anyone. Ask for help if you need support or instructions about stopping the use of drugs.  High blood pressure causes heart disease and increases the risk  of stroke. Your blood pressure should be checked at least every 1 to 2 years. Ongoing high blood pressure should be treated with medicines if weight loss and exercise do not work.  If you are 55-79 years old, ask your health care provider if you should take aspirin to prevent strokes.  Diabetes screening is done by taking a blood sample to check your blood glucose level after you have not eaten for a certain period of time (fasting). If you are not overweight and you do not have risk factors for diabetes, you should be screened once every 3 years starting at age 45. If you are overweight or obese and you are 40-70 years of age, you should be screened for diabetes every year as part of your cardiovascular risk assessment.  Breast cancer screening is essential preventive care for women. You should practice "breast self-awareness." This means understanding the normal appearance and feel of your breasts and may include breast self-examination. Any changes detected, no matter how small, should be reported to a health care provider. Women in their 20s and 30s should have a clinical breast exam (CBE) by a health care provider as part of a regular health exam every 1 to 3 years. After age 40, women should have a CBE every year. Starting at age 40, women should consider having a mammogram (breast X-ray test) every year. Women who have a family history of breast cancer should talk to their health care provider about genetic screening. Women at a high risk of breast cancer should talk to their health care providers about having an MRI and a mammogram every year.  Breast cancer gene (BRCA)-related cancer risk assessment is recommended for women who have family members with BRCA-related cancers. BRCA-related cancers include breast, ovarian, tubal, and peritoneal cancers. Having family members with these cancers may be associated with an increased risk for harmful changes (mutations) in the breast cancer genes BRCA1 and  BRCA2. Results of the assessment will determine the need for genetic counseling and BRCA1 and BRCA2 testing.  Your health care provider may recommend that you be screened regularly for cancer of the pelvic organs (ovaries, uterus, and vagina). This screening involves a pelvic examination, including checking for microscopic changes to the surface of your cervix (Pap test). You may be encouraged to have this screening done every 3 years, beginning at age 21.  For women ages 30-65, health care providers may recommend pelvic exams and Pap testing every 3 years, or they may recommend the Pap and pelvic exam, combined with testing for human papilloma virus (HPV), every 5 years. Some types of HPV increase your risk of cervical cancer. Testing for HPV may also be done on women of any age with unclear Pap test results.  Other health care providers may not recommend any screening for nonpregnant women who are considered low risk for pelvic cancer and who do not have symptoms. Ask your health care provider if a screening pelvic exam is right for   you.  If you have had past treatment for cervical cancer or a condition that could lead to cancer, you need Pap tests and screening for cancer for at least 20 years after your treatment. If Pap tests have been discontinued, your risk factors (such as having a new sexual partner) need to be reassessed to determine if screening should resume. Some women have medical problems that increase the chance of getting cervical cancer. In these cases, your health care provider may recommend more frequent screening and Pap tests.  Colorectal cancer can be detected and often prevented. Most routine colorectal cancer screening begins at the age of 50 years and continues through age 75 years. However, your health care provider may recommend screening at an earlier age if you have risk factors for colon cancer. On a yearly basis, your health care provider may provide home test kits to check  for hidden blood in the stool. Use of a small camera at the end of a tube, to directly examine the colon (sigmoidoscopy or colonoscopy), can detect the earliest forms of colorectal cancer. Talk to your health care provider about this at age 50, when routine screening begins. Direct exam of the colon should be repeated every 5-10 years through age 75 years, unless early forms of precancerous polyps or small growths are found.  People who are at an increased risk for hepatitis B should be screened for this virus. You are considered at high risk for hepatitis B if:  You were born in a country where hepatitis B occurs often. Talk with your health care provider about which countries are considered high risk.  Your parents were born in a high-risk country and you have not received a shot to protect against hepatitis B (hepatitis B vaccine).  You have HIV or AIDS.  You use needles to inject street drugs.  You live with, or have sex with, someone who has hepatitis B.  You get hemodialysis treatment.  You take certain medicines for conditions like cancer, organ transplantation, and autoimmune conditions.  Hepatitis C blood testing is recommended for all people born from 1945 through 1965 and any individual with known risks for hepatitis C.  Practice safe sex. Use condoms and avoid high-risk sexual practices to reduce the spread of sexually transmitted infections (STIs). STIs include gonorrhea, chlamydia, syphilis, trichomonas, herpes, HPV, and human immunodeficiency virus (HIV). Herpes, HIV, and HPV are viral illnesses that have no cure. They can result in disability, cancer, and death.  You should be screened for sexually transmitted illnesses (STIs) including gonorrhea and chlamydia if:  You are sexually active and are younger than 24 years.  You are older than 24 years and your health care provider tells you that you are at risk for this type of infection.  Your sexual activity has changed  since you were last screened and you are at an increased risk for chlamydia or gonorrhea. Ask your health care provider if you are at risk.  If you are at risk of being infected with HIV, it is recommended that you take a prescription medicine daily to prevent HIV infection. This is called preexposure prophylaxis (PrEP). You are considered at risk if:  You are sexually active and do not regularly use condoms or know the HIV status of your partner(s).  You take drugs by injection.  You are sexually active with a partner who has HIV.  Talk with your health care provider about whether you are at high risk of being infected with HIV. If   you choose to begin PrEP, you should first be tested for HIV. You should then be tested every 3 months for as long as you are taking PrEP.  Osteoporosis is a disease in which the bones lose minerals and strength with aging. This can result in serious bone fractures or breaks. The risk of osteoporosis can be identified using a bone density scan. Women ages 67 years and over and women at risk for fractures or osteoporosis should discuss screening with their health care providers. Ask your health care provider whether you should take a calcium supplement or vitamin D to reduce the rate of osteoporosis.  Menopause can be associated with physical symptoms and risks. Hormone replacement therapy is available to decrease symptoms and risks. You should talk to your health care provider about whether hormone replacement therapy is right for you.  Use sunscreen. Apply sunscreen liberally and repeatedly throughout the day. You should seek shade when your shadow is shorter than you. Protect yourself by wearing long sleeves, pants, a wide-brimmed hat, and sunglasses year round, whenever you are outdoors.  Once a month, do a whole body skin exam, using a mirror to look at the skin on your back. Tell your health care provider of new moles, moles that have irregular borders, moles that  are larger than a pencil eraser, or moles that have changed in shape or color.  Stay current with required vaccines (immunizations).  Influenza vaccine. All adults should be immunized every year.  Tetanus, diphtheria, and acellular pertussis (Td, Tdap) vaccine. Pregnant women should receive 1 dose of Tdap vaccine during each pregnancy. The dose should be obtained regardless of the length of time since the last dose. Immunization is preferred during the 27th-36th week of gestation. An adult who has not previously received Tdap or who does not know her vaccine status should receive 1 dose of Tdap. This initial dose should be followed by tetanus and diphtheria toxoids (Td) booster doses every 10 years. Adults with an unknown or incomplete history of completing a 3-dose immunization series with Td-containing vaccines should begin or complete a primary immunization series including a Tdap dose. Adults should receive a Td booster every 10 years.  Varicella vaccine. An adult without evidence of immunity to varicella should receive 2 doses or a second dose if she has previously received 1 dose. Pregnant females who do not have evidence of immunity should receive the first dose after pregnancy. This first dose should be obtained before leaving the health care facility. The second dose should be obtained 4-8 weeks after the first dose.  Human papillomavirus (HPV) vaccine. Females aged 13-26 years who have not received the vaccine previously should obtain the 3-dose series. The vaccine is not recommended for use in pregnant females. However, pregnancy testing is not needed before receiving a dose. If a female is found to be pregnant after receiving a dose, no treatment is needed. In that case, the remaining doses should be delayed until after the pregnancy. Immunization is recommended for any person with an immunocompromised condition through the age of 61 years if she did not get any or all doses earlier. During the  3-dose series, the second dose should be obtained 4-8 weeks after the first dose. The third dose should be obtained 24 weeks after the first dose and 16 weeks after the second dose.  Zoster vaccine. One dose is recommended for adults aged 30 years or older unless certain conditions are present.  Measles, mumps, and rubella (MMR) vaccine. Adults born  before 1957 generally are considered immune to measles and mumps. Adults born in 1957 or later should have 1 or more doses of MMR vaccine unless there is a contraindication to the vaccine or there is laboratory evidence of immunity to each of the three diseases. A routine second dose of MMR vaccine should be obtained at least 28 days after the first dose for students attending postsecondary schools, health care workers, or international travelers. People who received inactivated measles vaccine or an unknown type of measles vaccine during 1963-1967 should receive 2 doses of MMR vaccine. People who received inactivated mumps vaccine or an unknown type of mumps vaccine before 1979 and are at high risk for mumps infection should consider immunization with 2 doses of MMR vaccine. For females of childbearing age, rubella immunity should be determined. If there is no evidence of immunity, females who are not pregnant should be vaccinated. If there is no evidence of immunity, females who are pregnant should delay immunization until after pregnancy. Unvaccinated health care workers born before 1957 who lack laboratory evidence of measles, mumps, or rubella immunity or laboratory confirmation of disease should consider measles and mumps immunization with 2 doses of MMR vaccine or rubella immunization with 1 dose of MMR vaccine.  Pneumococcal 13-valent conjugate (PCV13) vaccine. When indicated, a person who is uncertain of his immunization history and has no record of immunization should receive the PCV13 vaccine. All adults 65 years of age and older should receive this  vaccine. An adult aged 19 years or older who has certain medical conditions and has not been previously immunized should receive 1 dose of PCV13 vaccine. This PCV13 should be followed with a dose of pneumococcal polysaccharide (PPSV23) vaccine. Adults who are at high risk for pneumococcal disease should obtain the PPSV23 vaccine at least 8 weeks after the dose of PCV13 vaccine. Adults older than 72 years of age who have normal immune system function should obtain the PPSV23 vaccine dose at least 1 year after the dose of PCV13 vaccine.  Pneumococcal polysaccharide (PPSV23) vaccine. When PCV13 is also indicated, PCV13 should be obtained first. All adults aged 65 years and older should be immunized. An adult younger than age 65 years who has certain medical conditions should be immunized. Any person who resides in a nursing home or long-term care facility should be immunized. An adult smoker should be immunized. People with an immunocompromised condition and certain other conditions should receive both PCV13 and PPSV23 vaccines. People with human immunodeficiency virus (HIV) infection should be immunized as soon as possible after diagnosis. Immunization during chemotherapy or radiation therapy should be avoided. Routine use of PPSV23 vaccine is not recommended for American Indians, [place] Natives, or people younger than 65 years unless there are medical conditions that require PPSV23 vaccine. When indicated, people who have unknown immunization and have no record of immunization should receive PPSV23 vaccine. One-time revaccination 5 years after the first dose of PPSV23 is recommended for people aged 19-64 years who have chronic kidney failure, nephrotic syndrome, asplenia, or immunocompromised conditions. People who received 1-2 doses of PPSV23 before age 65 years should receive another dose of PPSV23 vaccine at age 65 years or later if at least 5 years have passed since the previous dose. Doses of PPSV23 are not  needed for people immunized with PPSV23 at or after age 65 years.  Meningococcal vaccine. Adults with asplenia or persistent complement component deficiencies should receive 2 doses of quadrivalent meningococcal conjugate (MenACWY-D) vaccine. The doses should be obtained   at least 2 months apart. Microbiologists working with certain meningococcal bacteria, [place] recruits, people at risk during an outbreak, and people who travel to or live in countries with a high rate of meningitis should be immunized. A first-year college student up through age 34 years who is living in a residence hall should receive a dose if she did not receive a dose on or after her 16th birthday. Adults who have certain high-risk conditions should receive one or more doses of vaccine.  Hepatitis A vaccine. Adults who wish to be protected from this disease, have certain high-risk conditions, work with hepatitis A-infected animals, work in hepatitis A research labs, or travel to or work in countries with a high rate of hepatitis A should be immunized. Adults who were previously unvaccinated and who anticipate close contact with an international adoptee during the first 60 days after arrival in the [place] from a country with a high rate of hepatitis A should be immunized.  Hepatitis B vaccine. Adults who wish to be protected from this disease, have certain high-risk conditions, may be exposed to blood or other infectious body fluids, are household contacts or sex partners of hepatitis B positive people, are clients or workers in certain care facilities, or travel to or work in countries with a high rate of hepatitis B should be immunized.  Haemophilus influenzae type b (Hib) vaccine. A previously unvaccinated person with asplenia or sickle cell disease or having a scheduled splenectomy should receive 1 dose of Hib vaccine. Regardless of previous immunization, a recipient of a hematopoietic stem cell transplant should receive a  3-dose series 6-12 months after her successful transplant. Hib vaccine is not recommended for adults with HIV infection. Preventive Services / Frequency Ages 35 to 4 years  Blood pressure check.** / Every 3-5 years.  Lipid and cholesterol check.** / Every 5 years beginning at age 60.  Clinical breast exam.** / Every 3 years for women in their 71s and 10s.  BRCA-related cancer risk assessment.** / For women who have family members with a BRCA-related cancer (breast, ovarian, tubal, or peritoneal cancers).  Pap test.** / Every 2 years from ages 76 through 26. Every 3 years starting at age 61 through age 76 or 93 with a history of 3 consecutive normal Pap tests.  HPV screening.** / Every 3 years from ages 37 through ages 60 to 51 with a history of 3 consecutive normal Pap tests.  Hepatitis C blood test.** / For any individual with known risks for hepatitis C.  Skin self-exam. / Monthly.  Influenza vaccine. / Every year.  Tetanus, diphtheria, and acellular pertussis (Tdap, Td) vaccine.** / Consult your health care provider. Pregnant women should receive 1 dose of Tdap vaccine during each pregnancy. 1 dose of Td every 10 years.  Varicella vaccine.** / Consult your health care provider. Pregnant females who do not have evidence of immunity should receive the first dose after pregnancy.  HPV vaccine. / 3 doses over 6 months, if 93 and younger. The vaccine is not recommended for use in pregnant females. However, pregnancy testing is not needed before receiving a dose.  Measles, mumps, rubella (MMR) vaccine.** / You need at least 1 dose of MMR if you were born in 1957 or later. You may also need a 2nd dose. For females of childbearing age, rubella immunity should be determined. If there is no evidence of immunity, females who are not pregnant should be vaccinated. If there is no evidence of immunity, females who are  pregnant should delay immunization until after pregnancy.  Pneumococcal  13-valent conjugate (PCV13) vaccine.** / Consult your health care provider.  Pneumococcal polysaccharide (PPSV23) vaccine.** / 1 to 2 doses if you smoke cigarettes or if you have certain conditions.  Meningococcal vaccine.** / 1 dose if you are age 68 to 8 years and a Market researcher living in a residence hall, or have one of several medical conditions, you need to get vaccinated against meningococcal disease. You may also need additional booster doses.  Hepatitis A vaccine.** / Consult your health care provider.  Hepatitis B vaccine.** / Consult your health care provider.  Haemophilus influenzae type b (Hib) vaccine.** / Consult your health care provider. Ages 7 to 53 years  Blood pressure check.** / Every year.  Lipid and cholesterol check.** / Every 5 years beginning at age 25 years.  Lung cancer screening. / Every year if you are aged 11-80 years and have a 30-pack-year history of smoking and currently smoke or have quit within the past 15 years. Yearly screening is stopped once you have quit smoking for at least 15 years or develop a health problem that would prevent you from having lung cancer treatment.  Clinical breast exam.** / Every year after age 48 years.  BRCA-related cancer risk assessment.** / For women who have family members with a BRCA-related cancer (breast, ovarian, tubal, or peritoneal cancers).  Mammogram.** / Every year beginning at age 41 years and continuing for as long as you are in good health. Consult with your health care provider.  Pap test.** / Every 3 years starting at age 65 years through age 37 or 70 years with a history of 3 consecutive normal Pap tests.  HPV screening.** / Every 3 years from ages 72 years through ages 60 to 40 years with a history of 3 consecutive normal Pap tests.  Fecal occult blood test (FOBT) of stool. / Every year beginning at age 21 years and continuing until age 5 years. You may not need to do this test if you get  a colonoscopy every 10 years.  Flexible sigmoidoscopy or colonoscopy.** / Every 5 years for a flexible sigmoidoscopy or every 10 years for a colonoscopy beginning at age 35 years and continuing until age 48 years.  Hepatitis C blood test.** / For all people born from [birth year] through 1965 and any individual with known risks for hepatitis C.  Skin self-exam. / Monthly.  Influenza vaccine. / Every year.  Tetanus, diphtheria, and acellular pertussis (Tdap/Td) vaccine.** / Consult your health care provider. Pregnant women should receive 1 dose of Tdap vaccine during each pregnancy. 1 dose of Td every 10 years.  Varicella vaccine.** / Consult your health care provider. Pregnant females who do not have evidence of immunity should receive the first dose after pregnancy.  Zoster vaccine.** / 1 dose for adults aged 30 years or older.  Measles, mumps, rubella (MMR) vaccine.** / You need at least 1 dose of MMR if you were born in 1957 or later. You may also need a second dose. For females of childbearing age, rubella immunity should be determined. If there is no evidence of immunity, females who are not pregnant should be vaccinated. If there is no evidence of immunity, females who are pregnant should delay immunization until after pregnancy.  Pneumococcal 13-valent conjugate (PCV13) vaccine.** / Consult your health care provider.  Pneumococcal polysaccharide (PPSV23) vaccine.** / 1 to 2 doses if you smoke cigarettes or if you have certain conditions.  Meningococcal vaccine.** /  Consult your health care provider.  Hepatitis A vaccine.** / Consult your health care provider.  Hepatitis B vaccine.** / Consult your health care provider.  Haemophilus influenzae type b (Hib) vaccine.** / Consult your health care provider. Ages 64 years and over  Blood pressure check.** / Every year.  Lipid and cholesterol check.** / Every 5 years beginning at age 23 years.  Lung cancer screening. / Every year if you  are aged 16-80 years and have a 30-pack-year history of smoking and currently smoke or have quit within the past 15 years. Yearly screening is stopped once you have quit smoking for at least 15 years or develop a health problem that would prevent you from having lung cancer treatment.  Clinical breast exam.** / Every year after age 74 years.  BRCA-related cancer risk assessment.** / For women who have family members with a BRCA-related cancer (breast, ovarian, tubal, or peritoneal cancers).  Mammogram.** / Every year beginning at age 44 years and continuing for as long as you are in good health. Consult with your health care provider.  Pap test.** / Every 3 years starting at age 58 years through age 22 or 39 years with 3 consecutive normal Pap tests. Testing can be stopped between 65 and 70 years with 3 consecutive normal Pap tests and no abnormal Pap or HPV tests in the past 10 years.  HPV screening.** / Every 3 years from ages 64 years through ages 70 or 61 years with a history of 3 consecutive normal Pap tests. Testing can be stopped between 65 and 70 years with 3 consecutive normal Pap tests and no abnormal Pap or HPV tests in the past 10 years.  Fecal occult blood test (FOBT) of stool. / Every year beginning at age 40 years and continuing until age 27 years. You may not need to do this test if you get a colonoscopy every 10 years.  Flexible sigmoidoscopy or colonoscopy.** / Every 5 years for a flexible sigmoidoscopy or every 10 years for a colonoscopy beginning at age 7 years and continuing until age 32 years.  Hepatitis C blood test.** / For all people born from [birth year] through 1967 and any individual with known risks for hepatitis C.  Osteoporosis screening.** / A one-time screening for women ages 30 years and over and women at risk for fractures or osteoporosis.  Skin self-exam. / Monthly.  Influenza vaccine. / Every year.  Tetanus, diphtheria, and acellular pertussis (Tdap/Td)  vaccine.** / 1 dose of Td every 10 years.  Varicella vaccine.** / Consult your health care provider.  Zoster vaccine.** / 1 dose for adults aged 35 years or older.  Pneumococcal 13-valent conjugate (PCV13) vaccine.** / Consult your health care provider.  Pneumococcal polysaccharide (PPSV23) vaccine.** / 1 dose for all adults aged 46 years and older.  Meningococcal vaccine.** / Consult your health care provider.  Hepatitis A vaccine.** / Consult your health care provider.  Hepatitis B vaccine.** / Consult your health care provider.  Haemophilus influenzae type b (Hib) vaccine.** / Consult your health care provider. ** Family history and personal history of risk and conditions may change your health care provider's recommendations.   This information is not intended to replace advice given to you by your health care provider. Make sure you discuss any questions you have with your health care provider.   Document Released: 08/02/2001 Document Revised: 06/27/2014 Document Reviewed: 11/01/2010 Elsevier Interactive Patient Education [employer].

## 2015-09-16 NOTE — Progress Notes (Signed)
Subjective:  Patient ID: Jamie Villa, female    DOB: 02/25/1946  Age: 70 y.o. MRN: [id number]  CC: Hypertension; Hyperlipidemia; and Annual Exam   HPI DORACE SCINTA presents for an annual exam as well as follow up on hypertension and hypercholesterolemia. She tells me that her blood pressure is well-controlled on the combination of carvedilol, losartan, and hydrochlorothiazide. She exercises on an exercise bike several times a week and does not experience any dyspnea on exertion. She denies headache, blurred vision, chest pain, shortness of breath, edema, or palpitations.  Outpatient Prescriptions Prior to Visit  Medication Sig Dispense Refill  . carvedilol (COREG) 6.25 MG tablet Take 1 tablet (6.25 mg total) by mouth 2 (two) times daily with a meal. 180 tablet 0  . Cholecalciferol 50000 units TABS Take 1 tablet by mouth once a week. 12 tablet 3  . losartan-hydrochlorothiazide (HYZAAR) 100-25 MG tablet Take 1 tablet by mouth daily. 90 tablet 0  . meclizine (ANTIVERT) 25 MG tablet Take 1 tablet (25 mg total) by mouth 3 (three) times daily as needed for dizziness. 65 tablet 1  . potassium chloride SA (K-DUR,KLOR-CON) 20 MEQ tablet Take 1 tablet (20 mEq total) by mouth daily. 30 tablet 11  . ondansetron (ZOFRAN) 4 MG tablet Take 1 tablet (4 mg total) by mouth every 8 (eight) hours as needed for nausea or vomiting. (Patient not taking: Reported on 09/16/2015) 20 tablet 0   No facility-administered medications prior to visit.    ROS Review of Systems  Constitutional: Negative.  Negative for activity change.  HENT: Negative.   Eyes: Negative.   Respiratory: Negative.  Negative for cough, choking, chest tightness, shortness of breath and stridor.   Cardiovascular: Negative.  Negative for chest pain, palpitations and leg swelling.  Gastrointestinal: Negative.  Negative for nausea, vomiting, abdominal pain, diarrhea and constipation.  Endocrine: Negative.   Genitourinary: Negative.     Musculoskeletal: Negative.  Negative for myalgias, back pain, joint swelling and arthralgias.  Skin: Negative.  Negative for color change, pallor and rash.  Allergic/Immunologic: Negative.   Neurological: Negative.  Negative for dizziness, syncope, speech difficulty, weakness, light-headedness and headaches.  Hematological: Negative.  Negative for adenopathy. Does not bruise/bleed easily.  Psychiatric/Behavioral: Negative.     Objective:  BP 138/80 mmHg  Pulse 73  Temp(Src) 97.8 F (36.6 C) (Oral)  Resp 16  Ht 5\' 6"  (1.676 m)  Wt 166 lb (75.297 kg)  BMI 26.81 kg/m2  SpO2 98%  BP Readings from Last 3 Encounters:  09/16/15 138/80  07/21/15 144/98  06/23/15 138/88    Wt Readings from Last 3 Encounters:  09/16/15 166 lb (75.297 kg)  07/21/15 163 lb (73.936 kg)  06/30/15 150 lb (68.04 kg)    Physical Exam  Constitutional: She is oriented to person, place, and time. She appears well-developed and well-nourished. No distress.  HENT:  Head: Normocephalic and atraumatic.  Mouth/Throat: Oropharynx is clear and moist. No oropharyngeal exudate.  Eyes: Conjunctivae are normal. Right eye exhibits no discharge. Left eye exhibits no discharge. No scleral icterus.  Neck: Normal range of motion. Neck supple. No JVD present. No tracheal deviation present. No thyromegaly present.  Cardiovascular: Normal rate, normal heart sounds and intact distal pulses.  Exam reveals no gallop and no friction rub.   No murmur heard. Pulmonary/Chest: Effort normal and breath sounds normal. No stridor. No respiratory distress. She has no wheezes. She has no rales. She exhibits no tenderness.  Abdominal: Soft. Bowel sounds are normal. She exhibits  no distension and no mass. There is no tenderness. There is no rebound and no guarding.  Musculoskeletal: Normal range of motion. She exhibits no edema or tenderness.  Lymphadenopathy:    She has no cervical adenopathy.  Neurological: She is oriented to person,  place, and time.  Skin: Skin is warm and dry. No rash noted. She is not diaphoretic. No erythema. No pallor.  Psychiatric: She has a normal mood and affect. Her behavior is normal. Judgment and thought content normal.  Vitals reviewed.   Lab Results  Component Value Date   WBC 5.6 08/27/2014   HGB 14.1 08/27/2014   HCT 40.9 08/27/2014   PLT 183.0 08/27/2014   GLUCOSE 96 09/16/2015   CHOL 247* 09/16/2015   TRIG 148.0 09/16/2015   HDL 70.60 09/16/2015   LDLDIRECT 153.0 08/27/2014   LDLCALC 147* 09/16/2015   ALT 18 08/27/2014   AST 19 08/27/2014   NA 146* 09/16/2015   K 3.5 09/16/2015   CL 105 09/16/2015   CREATININE 0.84 09/16/2015   BUN 25* 09/16/2015   CO2 33* 09/16/2015   TSH 4.06 09/16/2015   INR 0.90 10/14/2009   HGBA1C 5.8 09/16/2015    Mr Brain Wo Contrast  06/30/2015  CLINICAL DATA:  70 year old female with dizziness and nausea for 2 months, loss of balance. Initial encounter. Personal history of lymphoma in 2009 with recurrence in 2011. EXAM: MRI HEAD WITHOUT CONTRAST TECHNIQUE: Multiplanar, multiecho pulse sequences of the brain and surrounding structures were obtained without intravenous contrast. COMPARISON:  Neck CT 05/12/2008. FINDINGS: Cerebral volume is within normal limits for age. No restricted diffusion to suggest acute infarction. No midline shift, mass effect, evidence of mass lesion, ventriculomegaly, extra-axial collection or acute intracranial hemorrhage. Cervicomedullary junction and pituitary are within normal limits. Major intracranial vascular flow voids are within normal limits, dominant left vertebral artery re- demonstrated. Scattered mild for age nonspecific cerebral white matter T2 and FLAIR hyperintensity. No cortical encephalomalacia or chronic cerebral blood products. Deep gray matter nuclei, brainstem, and cerebellum are within normal limits. Visible internal auditory structures appear normal. Trace mastoid fluid on the left, appears inconsequential.  Negative nasopharynx. Normal stylomastoid foramina. Paranasal sinuses are clear. Postoperative changes to both globes. Negative orbit and scalp soft tissues. Normal bone marrow signal. Negative visualized cervical spine. IMPRESSION: No acute intracranial abnormality and largely unremarkable for age noncontrast MRI appearance of the brain. Electronically Signed   By: Genevie Ann M.D.   On: 06/30/2015 16:36    Assessment & Plan:   Jonte was seen today for hypertension, hyperlipidemia and annual exam.  Diagnoses and all orders for this visit:  Essential hypertension- Her blood pressure is well controlled, electrolytes and renal function are stable. -     Magnesium; Future -     Basic metabolic panel; Future -     TSH; Future  Hyperglycemia- she has mild prediabetes, no medications are needed at this time, she will continue to work on her lifestyle modifications. -     Hemoglobin A1c; Future  Hypokalemia- this is caused by her diuretic therapy, her potassium level is normal now, I've asked her to continue taking the potassium supplement therapy. -     Magnesium; Future -     Basic metabolic panel; Future  Pure hypercholesterolemia- her Framingham risk score is 8%, she does not want to start taking a statin. -     Lipid panel; Future -     TSH; Future   I am having Ms. Hilburn maintain her  meclizine, potassium chloride SA, Cholecalciferol, ondansetron, carvedilol, and losartan-hydrochlorothiazide.  No orders of the defined types were placed in this encounter.    See AVS for instructions about healthy living and anticipatory guidance. Follow-up: Return in about 6 months (around 03/18/2016).  Scarlette Calico, MD

## 2015-09-17 NOTE — Assessment & Plan Note (Addendum)

## 2015-11-17 ENCOUNTER — Other Ambulatory Visit: Payer: Self-pay | Admitting: Internal Medicine

## 2015-11-18 DIAGNOSIS — Z9481 Bone marrow transplant status: Secondary | ICD-10-CM | POA: Diagnosis not present

## 2015-11-18 DIAGNOSIS — E559 Vitamin D deficiency, unspecified: Secondary | ICD-10-CM | POA: Diagnosis not present

## 2015-11-18 DIAGNOSIS — C8330 Diffuse large B-cell lymphoma, unspecified site: Secondary | ICD-10-CM | POA: Diagnosis not present

## 2016-02-22 ENCOUNTER — Other Ambulatory Visit: Payer: Self-pay | Admitting: Internal Medicine

## 2016-02-22 DIAGNOSIS — I10 Essential (primary) hypertension: Secondary | ICD-10-CM

## 2016-03-30 DIAGNOSIS — H5310 Unspecified subjective visual disturbances: Secondary | ICD-10-CM | POA: Diagnosis not present

## 2016-03-30 DIAGNOSIS — G43109 Migraine with aura, not intractable, without status migrainosus: Secondary | ICD-10-CM | POA: Diagnosis not present

## 2016-04-07 ENCOUNTER — Other Ambulatory Visit: Payer: Self-pay | Admitting: Internal Medicine

## 2016-04-07 ENCOUNTER — Telehealth: Payer: Self-pay

## 2016-04-07 ENCOUNTER — Ambulatory Visit (INDEPENDENT_AMBULATORY_CARE_PROVIDER_SITE_OTHER): Payer: Medicare Other | Admitting: Internal Medicine

## 2016-04-07 ENCOUNTER — Encounter: Payer: Self-pay | Admitting: Internal Medicine

## 2016-04-07 ENCOUNTER — Other Ambulatory Visit (INDEPENDENT_AMBULATORY_CARE_PROVIDER_SITE_OTHER): Payer: Medicare Other

## 2016-04-07 DIAGNOSIS — E876 Hypokalemia: Secondary | ICD-10-CM | POA: Diagnosis not present

## 2016-04-07 DIAGNOSIS — I10 Essential (primary) hypertension: Secondary | ICD-10-CM

## 2016-04-07 DIAGNOSIS — H539 Unspecified visual disturbance: Secondary | ICD-10-CM

## 2016-04-07 DIAGNOSIS — C8591 Non-Hodgkin lymphoma, unspecified, lymph nodes of head, face, and neck: Secondary | ICD-10-CM

## 2016-04-07 DIAGNOSIS — I776 Arteritis, unspecified: Secondary | ICD-10-CM | POA: Insufficient documentation

## 2016-04-07 LAB — BASIC METABOLIC PANEL
BUN: 24 mg/dL — AB (ref 6–23)
CALCIUM: 10 mg/dL (ref 8.4–10.5)
CO2: 34 mEq/L — ABNORMAL HIGH (ref 19–32)
CREATININE: 0.89 mg/dL (ref 0.40–1.20)
Chloride: 103 mEq/L (ref 96–112)
GFR: 66.25 mL/min (ref >60.00)
GLUCOSE: 103 mg/dL — AB (ref 70–99)
POTASSIUM: 3.6 meq/L (ref 3.5–5.1)
Sodium: 145 mEq/L (ref 135–145)

## 2016-04-07 LAB — CBC WITH DIFFERENTIAL/PLATELET
Basophils Absolute: 0 10*3/uL (ref 0.0–0.1)
Basophils Relative: 0.2 % (ref 0.0–3.0)
EOS PCT: 0.6 % (ref 0.0–5.0)
Eosinophils Absolute: 0 10*3/uL (ref 0.0–0.7)
HCT: 39 % (ref 36.0–46.0)
Hemoglobin: 13.4 g/dL (ref 12.0–15.0)
LYMPHS ABS: 1.7 10*3/uL (ref 0.7–4.0)
Lymphocytes Relative: 29.1 % (ref 12.0–46.0)
MCHC: 34.3 g/dL (ref 30.0–36.0)
MCV: 95 fl (ref 78.0–100.0)
MONO ABS: 0.7 10*3/uL (ref 0.1–1.0)
Monocytes Relative: 11.5 % (ref 3.0–12.0)
NEUTROS ABS: 3.5 10*3/uL (ref 1.4–7.7)
NEUTROS PCT: 58.6 % (ref 43.0–77.0)
PLATELETS: 220 10*3/uL (ref 150.0–400.0)
RBC: 4.11 Mil/uL (ref 3.87–5.11)
RDW: 13.4 % (ref 11.5–15.5)
WBC: 6 10*3/uL (ref 4.0–10.5)

## 2016-04-07 LAB — MAGNESIUM: Magnesium: 2.2 mg/dL (ref 1.5–2.5)

## 2016-04-07 LAB — SEDIMENTATION RATE: SED RATE: 8 mm/h (ref 0–30)

## 2016-04-07 NOTE — Progress Notes (Signed)
Pre visit review using our clinic review tool, if applicable. No additional management support is needed unless otherwise documented below in the visit note. 

## 2016-04-07 NOTE — Telephone Encounter (Signed)
Jamie Villa is requesting labs for CBC, ESR and CRP.  I have the CBC and ESR

## 2016-04-07 NOTE — Progress Notes (Signed)
Subjective:  Patient ID: Jamie Villa, female    DOB: May 24, 1946  Age: 70 y.o. MRN: [id number]  CC: No chief complaint on file.  She was not seen by me today. She had an appointment and came to the office. She told me that her eye doctor had recently asked for some blood work. I was unaware of any of this. I did not have any information from her eye doctor but was informed by my nurse that she wanted to have a CBC and sedimentation rate performed. I have ordered these labs at her request and they are normal.  HPI Jamie Villa presents for labs  Outpatient Medications Prior to Visit  Medication Sig Dispense Refill  . carvedilol (COREG) 6.25 MG tablet TAKE 1 TABLET(6.25 MG) BY MOUTH TWICE DAILY WITH A MEAL 180 tablet 0  . Cholecalciferol 50000 units TABS Take 1 tablet by mouth once a week. 12 tablet 3  . losartan-hydrochlorothiazide (HYZAAR) 100-25 MG tablet TAKE 1 TABLET BY MOUTH DAILY 90 tablet 3  . meclizine (ANTIVERT) 25 MG tablet Take 1 tablet (25 mg total) by mouth 3 (three) times daily as needed for dizziness. 65 tablet 1  . ondansetron (ZOFRAN) 4 MG tablet Take 1 tablet (4 mg total) by mouth every 8 (eight) hours as needed for nausea or vomiting. (Patient not taking: Reported on 09/16/2015) 20 tablet 0  . potassium chloride SA (K-DUR,KLOR-CON) 20 MEQ tablet Take 1 tablet (20 mEq total) by mouth daily. 30 tablet 11   No facility-administered medications prior to visit.     ROS Review of Systems  Objective:  There were no vitals taken for this visit.  BP Readings from Last 3 Encounters:  09/16/15 138/80  07/21/15 (!) 144/98  06/23/15 138/88    Wt Readings from Last 3 Encounters:  09/16/15 166 lb (75.3 kg)  07/21/15 163 lb (73.9 kg)  06/30/15 150 lb (68 kg)    Physical Exam  Lab Results  Component Value Date   WBC 5.6 08/27/2014   HGB 14.1 08/27/2014   HCT 40.9 08/27/2014   PLT 183.0 08/27/2014   GLUCOSE 96 09/16/2015   CHOL 247 (H) 09/16/2015   TRIG 148.0  09/16/2015   HDL 70.60 09/16/2015   LDLDIRECT 153.0 08/27/2014   LDLCALC 147 (H) 09/16/2015   ALT 18 08/27/2014   AST 19 08/27/2014   NA 146 (H) 09/16/2015   K 3.5 09/16/2015   CL 105 09/16/2015   CREATININE 0.84 09/16/2015   BUN 25 (H) 09/16/2015   CO2 33 (H) 09/16/2015   TSH 4.06 09/16/2015   INR 0.90 10/14/2009   HGBA1C 5.8 09/16/2015    Mr Brain Wo Contrast  Result Date: 06/30/2015 CLINICAL DATA:  70 year old female with dizziness and nausea for 2 months, loss of balance. Initial encounter. Personal history of lymphoma in 2009 with recurrence in 2011. EXAM: MRI HEAD WITHOUT CONTRAST TECHNIQUE: Multiplanar, multiecho pulse sequences of the brain and surrounding structures were obtained without intravenous contrast. COMPARISON:  Neck CT 05/12/2008. FINDINGS: Cerebral volume is within normal limits for age. No restricted diffusion to suggest acute infarction. No midline shift, mass effect, evidence of mass lesion, ventriculomegaly, extra-axial collection or acute intracranial hemorrhage. Cervicomedullary junction and pituitary are within normal limits. Major intracranial vascular flow voids are within normal limits, dominant left vertebral artery re- demonstrated. Scattered mild for age nonspecific cerebral white matter T2 and FLAIR hyperintensity. No cortical encephalomalacia or chronic cerebral blood products. Deep gray matter nuclei, brainstem, and cerebellum are within  normal limits. Visible internal auditory structures appear normal. Trace mastoid fluid on the left, appears inconsequential. Negative nasopharynx. Normal stylomastoid foramina. Paranasal sinuses are clear. Postoperative changes to both globes. Negative orbit and scalp soft tissues. Normal bone marrow signal. Negative visualized cervical spine. IMPRESSION: No acute intracranial abnormality and largely unremarkable for age noncontrast MRI appearance of the brain. Electronically Signed   By: Genevie Ann M.D.   On: 06/30/2015 16:36     Assessment & Plan:   There are no diagnoses linked to this encounter. I am having Ms. Hilburn maintain her meclizine, potassium chloride SA, Cholecalciferol, ondansetron, losartan-hydrochlorothiazide, and carvedilol.  No orders of the defined types were placed in this encounter.    Follow-up: No Follow-up on file.  Scarlette Calico, MD

## 2016-04-09 ENCOUNTER — Encounter: Payer: Self-pay | Admitting: Internal Medicine

## 2016-04-20 ENCOUNTER — Ambulatory Visit (INDEPENDENT_AMBULATORY_CARE_PROVIDER_SITE_OTHER): Payer: Medicare Other | Admitting: Neurology

## 2016-04-20 ENCOUNTER — Encounter: Payer: Self-pay | Admitting: Neurology

## 2016-04-20 VITALS — BP 150/72 | HR 83 | Ht 66.0 in | Wt 166.6 lb

## 2016-04-20 DIAGNOSIS — I10 Essential (primary) hypertension: Secondary | ICD-10-CM | POA: Diagnosis not present

## 2016-04-20 DIAGNOSIS — R404 Transient alteration of awareness: Secondary | ICD-10-CM | POA: Diagnosis not present

## 2016-04-20 NOTE — Patient Instructions (Signed)
I think the episodes are most likely atypical migraines.  However, I want to test for possible seizures as well. 1.  We will get an EEG.  On the way to the test, top off and get regular coffee.  I want you to drink coffee prior to the test so we can make you have another episode during the test.  Have your son drive you. 2.  If we are not able to provoke a spell, then we can consider a 24 hour home EEG. 3.  If testing is negative and we assume these are migraines, you may continue driving as long as you refrain from drinking any regular coffee.  I would recommend not drink any beverages with caffeine.

## 2016-04-20 NOTE — Progress Notes (Signed)
Chart forwarded.  

## 2016-04-20 NOTE — Progress Notes (Signed)
NEUROLOGY CONSULTATION NOTE  SHENELL LEITH MRN: [id number] DOB: 1946/06/06  Referring provider: Dr. Kathlen Mody Primary care provider: Dr. Ronnald Ramp  Reason for consult:  migraines  HISTORY OF PRESENT ILLNESS: Jamie Villa is a 70 year old woman with hypertension and history of non-Hodgkin's lymphoma who presents for migraine.   About 3 weeks ago, she began having habitual spells.  She has a "funny" feeling.  She cannot elaborate any further than that.  She reports that her vision becomes "fuzzy" or grayed out.  She says she is aware during the episode and she is able to respond to other people, however she cannot remember afterwards.  There is no associated headache, phantosmia, epigastric rising, deja vu, nausea, dizziness or palpitations.  It lasts about 15 to 20 minutes.  It only occurs about 15 to 20 minutes after she drinks regular coffee.  There appears to be no correlation with her blood pressure or sugar.  Since she had visual symptoms, she saw the ophthalmologist.  She was evaluated by Dr. 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  She had these spells once before, several years ago.  She has history of non-Hodgkin's lymphoma in 2009 and recurrence in 2011, but these spells happened years before that.  She does report remote history of headaches.  She did have an MRI of the brain without contrast on 06/30/15 to evaluate dizziness at that time, which was personally reviewed and was unremarkable.  She recently had labs on 04/07/16, including sed rate 8, normal CBC and BUN 24/Cr 0.89.  She has had depression over the past several years, related to the passing of her husband 3 years ago and dealing with her own cancer.  Recently, there has been some issues with her granddaughter, which has caused some distress.  PAST MEDICAL HISTORY: Past Medical History:  Diagnosis Date  . Hiatal hernia   . HTN (hypertension)   . Hx of  colonic polyps   . Hypothyroid   . Non Hodgkin's lymphoma ([place])   . Pneumonia    Healthcare-associated versus community-acquired pneumonia    PAST SURGICAL HISTORY: Past Surgical History:  Procedure Laterality Date  . ABDOMINAL HYSTERECTOMY    . S/P STEM CELL TRANSPLANT     Large B-Cell lymphoma    MEDICATIONS: Current Outpatient Prescriptions on File Prior to Visit  Medication Sig Dispense Refill  . carvedilol (COREG) 6.25 MG tablet TAKE 1 TABLET(6.25 MG) BY MOUTH TWICE DAILY WITH A MEAL 180 tablet 0  . losartan-hydrochlorothiazide (HYZAAR) 100-25 MG tablet TAKE 1 TABLET BY MOUTH DAILY 90 tablet 3  . Cholecalciferol 50000 units TABS Take 1 tablet by mouth once a week. (Patient not taking: Reported on 04/20/2016) 12 tablet 3  . meclizine (ANTIVERT) 25 MG tablet Take 1 tablet (25 mg total) by mouth 3 (three) times daily as needed for dizziness. (Patient not taking: Reported on 04/20/2016) 65 tablet 1  . ondansetron (ZOFRAN) 4 MG tablet Take 1 tablet (4 mg total) by mouth every 8 (eight) hours as needed for nausea or vomiting. (Patient not taking: Reported on 04/20/2016) 20 tablet 0  . potassium chloride SA (K-DUR,KLOR-CON) 20 MEQ tablet Take 1 tablet (20 mEq total) by mouth daily. (Patient not taking: Reported on 04/20/2016) 30 tablet 11   No current facility-administered medications on file prior to visit.     ALLERGIES: Allergies  Allergen Reactions  . Codeine   . Penicillins     REACTION: Hives  FAMILY HISTORY: Family History  Problem Relation Age of Onset  . Coronary artery disease Neg Hx   . Hypertension Other     SOCIAL HISTORY: Social History   Social History  . Marital status: Married    Spouse name: N/A  . Number of children: N/A  . Years of education: N/A   Occupational History  . Not on file.   Social History Main Topics  . Smoking status: Never Smoker  . Smokeless tobacco: Not on file  . Alcohol use No  . Drug use: No  . Sexual activity: Yes    Other Topics Concern  . Not on file   Social History Narrative   Married and currently lives with husband.    REVIEW OF SYSTEMS: Constitutional: No fevers, chills, or sweats, no generalized fatigue, change in appetite Eyes: No visual changes, double vision, eye pain Ear, nose and throat: No hearing loss, ear pain, nasal congestion, sore throat Cardiovascular: No chest pain, palpitations Respiratory:  No shortness of breath at rest or with exertion, wheezes GastrointestinaI: No nausea, vomiting, diarrhea, abdominal pain, fecal incontinence Genitourinary:  No dysuria, urinary retention or frequency Musculoskeletal:  No neck pain, back pain Integumentary: No rash, pruritus, skin lesions Neurological: as above Psychiatric: depression Endocrine: No palpitations, fatigue, diaphoresis, mood swings, change in appetite, change in weight, increased thirst Hematologic/Lymphatic:  No purpura, petechiae. Allergic/Immunologic: no itchy/runny eyes, nasal congestion, recent allergic reactions, rashes  PHYSICAL EXAM: Vitals:   04/20/16 1250  BP: (!) 150/72  Pulse: 83   General: No acute distress.  Patient appears well-groomed.  Head:  Normocephalic/atraumatic Eyes:  fundi examined but not visualized Neck: supple, no paraspinal tenderness, full range of motion Back: No paraspinal tenderness Heart: regular rate and rhythm Lungs: Clear to auscultation bilaterally. Vascular: No carotid bruits. Neurological Exam: Mental status: alert and oriented to person, place, and time, recent and remote memory intact, fund of knowledge intact, attention and concentration intact, speech fluent and not dysarthric, language intact. Cranial nerves: CN I: not tested CN II: pupils equal, round and reactive to light, visual fields intact CN III, IV, VI:  full range of motion, no nystagmus, no ptosis CN V: facial sensation intact CN VII: upper and lower face symmetric CN VIII: hearing intact CN IX, X: gag  intact, uvula midline CN XI: sternocleidomastoid and trapezius muscles intact CN XII: tongue midline Bulk & Tone: normal, no fasciculations. Motor:  5/5 throughout  Sensation:  Pinprick and vibration sensation intact. Deep Tendon Reflexes:  2+ throughout, toes downgoing.  Finger to nose testing:  Without dysmetria.  Heel to shin:  Without dysmetria.  Gait:  Normal station and stride.  Able to turn and tandem walk. Romberg negative.  IMPRESSION: 1.  Recurrent spells.  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  Also consider partial seizure.  Spells may be related to anxiety as well.  Since her exam is normal, she had an unremarkable MRI less than a year ago, and she has had these spells in the past, I don't think repeat MRI is needed. 2.  Hypertension  PLAN: 1.  We will set up for an EEG.  Her son will drive her to the appointment.  I will have her drink a cup of coffee prior to the study to try and provoke and capture a spell while the EEG is recording.  If we are unable to capture a spell in time, then we can order a 24 hour ambulatory EEG  2.  If EEG is unremarkable, then follow  up as needed.  I recommended not to drink coffee or any caffeinated beverage. 3.  Therapy for depression 4.  Blood pressure should be readdressed by PCP.  Thank you for allowing me to take part in the care of this patient.  Metta Clines, DO  CC:  Scarlette Calico, MD  Marshall Cork, MD

## 2016-04-27 ENCOUNTER — Ambulatory Visit (INDEPENDENT_AMBULATORY_CARE_PROVIDER_SITE_OTHER): Payer: Medicare Other | Admitting: Neurology

## 2016-04-27 DIAGNOSIS — R404 Transient alteration of awareness: Secondary | ICD-10-CM

## 2016-04-28 ENCOUNTER — Telehealth: Payer: Self-pay

## 2016-04-28 DIAGNOSIS — R404 Transient alteration of awareness: Secondary | ICD-10-CM

## 2016-04-28 NOTE — Telephone Encounter (Signed)
Message relayed to patient. Verbalized understanding and denied questions.   

## 2016-04-28 NOTE — Progress Notes (Signed)
ELECTROENCEPHALOGRAM REPORT  Date of Study: 04/27/2016  Patient's Name: Jamie Villa MRN: [id number] Date of Birth: Oct 16, 1945  Clinical History: 70 year old woman with habitual spells of altered sensorium, which occur only after drinking a cup of coffee.  Medications: COREG  HYZAAR Cholecalciferol 50000 units  ANTIVERT ZOFRAN K-DUR,KLOR-CON  Technical Summary: A multichannel digital EEG recording measured by the international 10-20 system with electrodes applied with paste and impedances below 5000 ohms performed in our laboratory with EKG monitoring in an awake and drowsy patient.  Hyperventilation and photic stimulation were performed.  The digital EEG was referentially recorded, reformatted, and digitally filtered in a variety of bipolar and referential montages for optimal display.  The patient drank a cup of coffee prior to the study.  Description: The patient is awake and drowsy during the recording.  During maximal wakefulness, there is a symmetric, medium voltage 8 Hz posterior dominant rhythm that attenuates with eye opening.  The record is symmetric.  During drowsiness and sleep, there is an increase in theta slowing of the background.  Vertex waves and symmetric sleep spindles were seen.  Hyperventilation and photic stimulation did not elicit any abnormalities.  There were no epileptiform discharges or electrographic seizures seen.  Her habitual spell was not captured.  EKG lead was unremarkable.  Impression: This awake and drowsy EEG is normal.    Clinical Correlation: A normal EEG does not exclude a clinical diagnosis of epilepsy.  If further clinical questions remain, prolonged EEG may be helpful.  Clinical correlation is advised.   Metta Clines, DO

## 2016-04-28 NOTE — Telephone Encounter (Signed)
-----   Message from Pieter Partridge, DO sent at 04/28/2016  7:04 AM EST ----- Her EEG is normal.  As we did not capture one of her spells during the EEG, I would like to order a 24 hour EEG and have her drink coffee during that study.

## 2016-05-04 ENCOUNTER — Ambulatory Visit (INDEPENDENT_AMBULATORY_CARE_PROVIDER_SITE_OTHER): Payer: Medicare Other | Admitting: Neurology

## 2016-05-04 DIAGNOSIS — R404 Transient alteration of awareness: Secondary | ICD-10-CM

## 2016-05-05 ENCOUNTER — Telehealth: Payer: Self-pay

## 2016-05-05 NOTE — Telephone Encounter (Signed)
Message relayed to patient. Verbalized understanding and denied questions.   

## 2016-05-05 NOTE — Telephone Encounter (Signed)
-----   Message from Pieter Partridge, DO sent at 05/05/2016  3:07 PM EST ----- EEG is normal.  It appears that she didn't exhibit her spell after drinking coffee.  At this point, no further testing is recommended.  I would advise not to drink caffeine, since that seems to be the trigger.

## 2016-05-05 NOTE — Progress Notes (Signed)
[place] REPORT  Dates of Recording: 05/04/2016 at 10:44:33 to 05/05/2016 at 10:52:03.  Patient's Name: Jamie Villa MRN: [id number] Date of Birth: Jan 29, 1946   Procedure: 24-hour ambulatory EEG  History: 70 year old woman with recurrent transient altered sensorium after drinking caffeine.  Medications: Coreg, Hyzaar, meclizine, Zofran  Technical Summary: This is a 48-hour multichannel digital EEG recording measured by the international 10-20 system with electrodes applied with paste and impedances below 5000 ohms performed as portable with EKG monitoring.  The digital EEG was referentially recorded, reformatted, and digitally filtered in a variety of bipolar and referential montages for optimal display.    DESCRIPTION OF RECORDING: During maximal wakefulness, the background activity consisted of a symmetric 8Hz  posterior dominant rhythm which was reactive to eye opening.  There were no epileptiform discharges or focal slowing seen in wakefulness.  During the recording, the patient progresses through wakefulness, drowsiness, and Stage 2 sleep.  Again, there were no epileptiform discharges seen.  Events:  On 05/04/16 patient did drink coffee at 16:30 and a Coke at 17:10.  On 05/05/16, she drank a cup of coffee at 08:30.  There were no electrographic seizures seen.  EKG lead was unremarkable.  IMPRESSION: This 24-hour ambulatory EEG study is normal.    CLINICAL CORRELATION: A normal EEG does not exclude a clinical diagnosis of epilepsy.  If further clinical questions remain, inpatient video EEG monitoring may be helpful.   Metta Clines, DO

## 2016-05-31 DIAGNOSIS — Z79899 Other long term (current) drug therapy: Secondary | ICD-10-CM | POA: Diagnosis not present

## 2016-05-31 DIAGNOSIS — K8020 Calculus of gallbladder without cholecystitis without obstruction: Secondary | ICD-10-CM | POA: Diagnosis not present

## 2016-05-31 DIAGNOSIS — Z8572 Personal history of non-Hodgkin lymphomas: Secondary | ICD-10-CM | POA: Diagnosis not present

## 2016-05-31 DIAGNOSIS — Z9481 Bone marrow transplant status: Secondary | ICD-10-CM | POA: Diagnosis not present

## 2016-05-31 DIAGNOSIS — Z08 Encounter for follow-up examination after completed treatment for malignant neoplasm: Secondary | ICD-10-CM | POA: Diagnosis not present

## 2016-05-31 DIAGNOSIS — I10 Essential (primary) hypertension: Secondary | ICD-10-CM | POA: Diagnosis not present

## 2016-05-31 DIAGNOSIS — Z8701 Personal history of pneumonia (recurrent): Secondary | ICD-10-CM | POA: Diagnosis not present

## 2016-05-31 DIAGNOSIS — R918 Other nonspecific abnormal finding of lung field: Secondary | ICD-10-CM | POA: Diagnosis not present

## 2016-05-31 DIAGNOSIS — C8330 Diffuse large B-cell lymphoma, unspecified site: Secondary | ICD-10-CM | POA: Diagnosis not present

## 2016-09-08 ENCOUNTER — Telehealth: Payer: Self-pay | Admitting: Internal Medicine

## 2016-09-08 NOTE — Telephone Encounter (Signed)
Called patient to schedule awv. Left vm for patient to call office to schedule appt.

## 2016-09-14 ENCOUNTER — Other Ambulatory Visit: Payer: Self-pay | Admitting: Internal Medicine

## 2016-09-14 DIAGNOSIS — I10 Essential (primary) hypertension: Secondary | ICD-10-CM

## 2016-11-14 ENCOUNTER — Other Ambulatory Visit: Payer: Self-pay | Admitting: Internal Medicine

## 2016-12-05 DIAGNOSIS — C8330 Diffuse large B-cell lymphoma, unspecified site: Secondary | ICD-10-CM | POA: Diagnosis not present

## 2016-12-05 DIAGNOSIS — Z8572 Personal history of non-Hodgkin lymphomas: Secondary | ICD-10-CM | POA: Diagnosis not present

## 2016-12-05 DIAGNOSIS — Z9481 Bone marrow transplant status: Secondary | ICD-10-CM | POA: Diagnosis not present

## 2016-12-11 ENCOUNTER — Other Ambulatory Visit: Payer: Self-pay | Admitting: Internal Medicine

## 2016-12-11 DIAGNOSIS — I10 Essential (primary) hypertension: Secondary | ICD-10-CM

## 2017-02-10 ENCOUNTER — Other Ambulatory Visit: Payer: Self-pay | Admitting: Internal Medicine

## 2017-02-22 ENCOUNTER — Ambulatory Visit (INDEPENDENT_AMBULATORY_CARE_PROVIDER_SITE_OTHER): Payer: Medicare Other | Admitting: Internal Medicine

## 2017-02-22 ENCOUNTER — Other Ambulatory Visit (INDEPENDENT_AMBULATORY_CARE_PROVIDER_SITE_OTHER): Payer: Medicare Other

## 2017-02-22 ENCOUNTER — Encounter: Payer: Self-pay | Admitting: Internal Medicine

## 2017-02-22 VITALS — BP 148/88 | HR 83 | Temp 98.0°F | Resp 16 | Ht 66.0 in | Wt 165.0 lb

## 2017-02-22 DIAGNOSIS — R739 Hyperglycemia, unspecified: Secondary | ICD-10-CM

## 2017-02-22 DIAGNOSIS — E7800 Pure hypercholesterolemia, unspecified: Secondary | ICD-10-CM

## 2017-02-22 DIAGNOSIS — I10 Essential (primary) hypertension: Secondary | ICD-10-CM

## 2017-02-22 DIAGNOSIS — Z0000 Encounter for general adult medical examination without abnormal findings: Secondary | ICD-10-CM | POA: Diagnosis not present

## 2017-02-22 LAB — CBC WITH DIFFERENTIAL/PLATELET
Basophils Absolute: 0 10*3/uL (ref 0.0–0.1)
Basophils Relative: 0.3 % (ref 0.0–3.0)
EOS PCT: 0.7 % (ref 0.0–5.0)
Eosinophils Absolute: 0 10*3/uL (ref 0.0–0.7)
HEMATOCRIT: 40.8 % (ref 36.0–46.0)
HEMOGLOBIN: 13.4 g/dL (ref 12.0–15.0)
LYMPHS PCT: 34.1 % (ref 12.0–46.0)
Lymphs Abs: 2 10*3/uL (ref 0.7–4.0)
MCHC: 32.9 g/dL (ref 30.0–36.0)
MCV: 98.3 fl (ref 78.0–100.0)
MONOS PCT: 9.5 % (ref 3.0–12.0)
Monocytes Absolute: 0.6 10*3/uL (ref 0.1–1.0)
Neutro Abs: 3.3 10*3/uL (ref 1.4–7.7)
Neutrophils Relative %: 55.4 % (ref 43.0–77.0)
Platelets: 212 10*3/uL (ref 150.0–400.0)
RBC: 4.16 Mil/uL (ref 3.87–5.11)
RDW: 12.9 % (ref 11.5–15.5)
WBC: 6 10*3/uL (ref 4.0–10.5)

## 2017-02-22 LAB — COMPREHENSIVE METABOLIC PANEL
ALBUMIN: 4.1 g/dL (ref 3.5–5.2)
ALK PHOS: 58 U/L (ref 39–117)
ALT: 18 U/L (ref 0–35)
AST: 18 U/L (ref 0–37)
BUN: 26 mg/dL — ABNORMAL HIGH (ref 6–23)
CO2: 32 mEq/L (ref 19–32)
Calcium: 9.8 mg/dL (ref 8.4–10.5)
Chloride: 102 mEq/L (ref 96–112)
Creatinine, Ser: 0.94 mg/dL (ref 0.40–1.20)
GFR: 62.05 mL/min (ref >60.00)
Glucose, Bld: 115 mg/dL — ABNORMAL HIGH (ref 70–99)
POTASSIUM: 3.5 meq/L (ref 3.5–5.1)
Sodium: 143 mEq/L (ref 135–145)
TOTAL PROTEIN: 6.7 g/dL (ref 6.0–8.3)
Total Bilirubin: 0.7 mg/dL (ref 0.2–1.2)

## 2017-02-22 LAB — LIPID PANEL
CHOLESTEROL: 255 mg/dL — AB (ref 0–200)
HDL: 64.9 mg/dL (ref >39.00)
LDL CALC: 152 mg/dL — AB (ref 0–99)
NonHDL: 189.64
Total CHOL/HDL Ratio: 4
Triglycerides: 186 mg/dL — ABNORMAL HIGH (ref 0.0–149.0)
VLDL: 37.2 mg/dL (ref 0.0–40.0)

## 2017-02-22 LAB — TSH: TSH: 3.5 u[IU]/mL (ref 0.35–4.50)

## 2017-02-22 MED ORDER — ROSUVASTATIN CALCIUM 10 MG PO TABS: 10.0000 mg | ORAL_TABLET | Freq: Every day | ORAL | 1 refills | Status: DC

## 2017-02-22 MED ORDER — ASPIRIN EC 81 MG PO TBEC: 81.0000 mg | DELAYED_RELEASE_TABLET | Freq: Every day | ORAL | 3 refills | Status: DC

## 2017-02-22 NOTE — Patient Instructions (Addendum)
Continue doing brain stimulating activities (puzzles, reading, adult coloring books, staying active) to keep memory sharp.   Continue to eat heart healthy diet (full of fruits, vegetables, whole grains, lean protein, water--limit salt, fat, and sugar intake) and increase physical activity as tolerated.   Jamie Villa , Thank you for taking time to come for your Medicare Wellness Visit. I appreciate your ongoing commitment to your health goals. Please review the following plan we discussed and let me know if I can assist you in the future.   These are the goals we discussed: Goals    . maintain current health status          Eat healthy, exercise, enjoy family and life.       This is a list of the screening recommended for you and due dates:  Health Maintenance  Topic Date Due  . Flu Shot  09/18/2017*  . Mammogram  06/24/2017  . Colon Cancer Screening  11/08/2022  . Tetanus Vaccine  12/06/2023  . DEXA scan (bone density measurement)  Completed  .  Hepatitis C: One time screening is recommended by Center for Disease Control  (CDC) for  adults born from [birth year] through 1967.   Completed  . Pneumonia vaccines  Completed  *Topic was postponed. The date shown is not the original due date.     Hypertension Hypertension, commonly called high blood pressure, is when the force of blood pumping through the arteries is too strong. The arteries are the blood vessels that carry blood from the heart throughout the body. Hypertension forces the heart to work harder to pump blood and may cause arteries to become narrow or stiff. Having untreated or uncontrolled hypertension can cause heart attacks, strokes, kidney disease, and other problems. A blood pressure reading consists of a higher number over a lower number. Ideally, your blood pressure should be below 120/80. The first ("top") number is called the systolic pressure. It is a measure of the pressure in your arteries as your heart beats. The second  ("bottom") number is called the diastolic pressure. It is a measure of the pressure in your arteries as the heart relaxes. What are the causes? The cause of this condition is not known. What increases the risk? Some risk factors for high blood pressure are under your control. Others are not. Factors you can change  Smoking.  Having type 2 diabetes mellitus, high cholesterol, or both.  Not getting enough exercise or physical activity.  Being overweight.  Having too much fat, sugar, calories, or salt (sodium) in your diet.  Drinking too much alcohol. Factors that are difficult or impossible to change  Having chronic kidney disease.  Having a family history of high blood pressure.  Age. Risk increases with age.  Race. You may be at higher risk if you are African-American.  Gender. Men are at higher risk than women before age 67. After age 63, women are at higher risk than men.  Having obstructive sleep apnea.  Stress. What are the signs or symptoms? Extremely high blood pressure (hypertensive crisis) may cause:  Headache.  Anxiety.  Shortness of breath.  Nosebleed.  Nausea and vomiting.  Severe chest pain.  Jerky movements you cannot control (seizures).  How is this diagnosed? This condition is diagnosed by measuring your blood pressure while you are seated, with your arm resting on a surface. The cuff of the blood pressure monitor will be placed directly against the skin of your upper arm at the level of your  heart. It should be measured at least twice using the same arm. Certain conditions can cause a difference in blood pressure between your right and left arms. Certain factors can cause blood pressure readings to be lower or higher than normal (elevated) for a short period of time:  When your blood pressure is higher when you are in a health care provider's office than when you are at home, this is called white coat hypertension. Most people with this condition  do not need medicines.  When your blood pressure is higher at home than when you are in a health care provider's office, this is called masked hypertension. Most people with this condition may need medicines to control blood pressure.  If you have a high blood pressure reading during one visit or you have normal blood pressure with other risk factors:  You may be asked to return on a different day to have your blood pressure checked again.  You may be asked to monitor your blood pressure at home for 1 week or longer.  If you are diagnosed with hypertension, you may have other blood or imaging tests to help your health care provider understand your overall risk for other conditions. How is this treated? This condition is treated by making healthy lifestyle changes, such as eating healthy foods, exercising more, and reducing your alcohol intake. Your health care provider may prescribe medicine if lifestyle changes are not enough to get your blood pressure under control, and if:  Your systolic blood pressure is above 130.  Your diastolic blood pressure is above 80.  Your personal target blood pressure may vary depending on your medical conditions, your age, and other factors. Follow these instructions at home: Eating and drinking  Eat a diet that is high in fiber and potassium, and low in sodium, added sugar, and fat. An example eating plan is called the DASH (Dietary Approaches to Stop Hypertension) diet. To eat this way: ? Eat plenty of fresh fruits and vegetables. Try to fill half of your plate at each meal with fruits and vegetables. ? Eat whole grains, such as whole wheat pasta, brown rice, or whole grain bread. Fill about one quarter of your plate with whole grains. ? Eat or drink low-fat dairy products, such as skim milk or low-fat yogurt. ? Avoid fatty cuts of meat, processed or cured meats, and poultry with skin. Fill about one quarter of your plate with lean proteins, such as fish,  chicken without skin, beans, eggs, and tofu. ? Avoid premade and processed foods. These tend to be higher in sodium, added sugar, and fat.  Reduce your daily sodium intake. Most people with hypertension should eat less than 1,500 mg of sodium a day.  Limit alcohol intake to no more than 1 drink a day for nonpregnant women and 2 drinks a day for men. One drink equals 12 oz of beer, 5 oz of wine, or 1 oz of hard liquor. Lifestyle  Work with your health care provider to maintain a healthy body weight or to lose weight. Ask what an ideal weight is for you.  Get at least 30 minutes of exercise that causes your heart to beat faster (aerobic exercise) most days of the week. Activities may include walking, swimming, or biking.  Include exercise to strengthen your muscles (resistance exercise), such as pilates or lifting weights, as part of your weekly exercise routine. Try to do these types of exercises for 30 minutes at least 3 days a week.  Do  not use any products that contain nicotine or tobacco, such as cigarettes and e-cigarettes. If you need help quitting, ask your health care provider.  Monitor your blood pressure at home as told by your health care provider.  Keep all follow-up visits as told by your health care provider. This is important. Medicines  Take over-the-counter and prescription medicines only as told by your health care provider. Follow directions carefully. Blood pressure medicines must be taken as prescribed.  Do not skip doses of blood pressure medicine. Doing this puts you at risk for problems and can make the medicine less effective.  Ask your health care provider about side effects or reactions to medicines that you should watch for. Contact a health care provider if:  You think you are having a reaction to a medicine you are taking.  You have headaches that keep coming back (recurring).  You feel dizzy.  You have swelling in your ankles.  You have trouble with  your vision. Get help right away if:  You develop a severe headache or confusion.  You have unusual weakness or numbness.  You feel faint.  You have severe pain in your chest or abdomen.  You vomit repeatedly.  You have trouble breathing. Summary  Hypertension is when the force of blood pumping through your arteries is too strong. If this condition is not controlled, it may put you at risk for serious complications.  Your personal target blood pressure may vary depending on your medical conditions, your age, and other factors. For most people, a normal blood pressure is less than 120/80.  Hypertension is treated with lifestyle changes, medicines, or a combination of both. Lifestyle changes include weight loss, eating a healthy, low-sodium diet, exercising more, and limiting alcohol. This information is not intended to replace advice given to you by your health care provider. Make sure you discuss any questions you have with your health care provider. Document Released: 06/06/2005 Document Revised: 05/04/2016 Document Reviewed: 05/04/2016 Elsevier Interactive Patient Education  [employer].

## 2017-02-22 NOTE — Progress Notes (Signed)
Pre visit review using our clinic review tool, if applicable. No additional management support is needed unless otherwise documented below in the visit note. 

## 2017-02-22 NOTE — Progress Notes (Addendum)
Subjective:   Jamie Villa is a 71 y.o. female who presents for Medicare Annual (Subsequent) preventive examination.  Review of Systems:  No ROS.  Medicare Wellness Visit. Additional risk factors are reflected in the social history.  Cardiac Risk Factors include: advanced age (>6men, >71 women);hypertension Sleep patterns: feels rested on waking, gets up 1 times nightly to void and sleeps 6-7 hours nightly.    Home Safety/Smoke Alarms: Feels safe in home. Smoke alarms in place.  Living environment; residence and Firearm Safety: 2-story house, no firearms. Lives alone, no needs for DME, good support system Seat Belt Safety/Bike Helmet: Wears seat belt.     Objective:     Vitals: BP (!) 148/88 (BP Location: Left Arm, Patient Position: Sitting, Cuff Size: Normal)   Pulse 83   Temp 98 F (36.7 C) (Oral)   Resp 16   Ht 5\' 6"  (1.676 m)   Wt 165 lb (74.8 kg)   SpO2 98%   BMI 26.63 kg/m   Body mass index is 26.63 kg/m.   Tobacco History  Smoking Status  . Never Smoker  Smokeless Tobacco  . Never Used     Counseling given: Not Answered   Past Medical History:  Diagnosis Date  . Hiatal hernia   . HTN (hypertension)   . Hx of colonic polyps   . Hypothyroid   . Non Hodgkin's lymphoma ([place])   . Pneumonia    Healthcare-associated versus community-acquired pneumonia   Past Surgical History:  Procedure Laterality Date  . ABDOMINAL HYSTERECTOMY    . S/P STEM CELL TRANSPLANT     Large B-Cell lymphoma   Family History  Problem Relation Age of Onset  . Hypertension Other   . Coronary artery disease Neg Hx    History  Sexual Activity  . Sexual activity: Yes    Outpatient Encounter Prescriptions as of 02/22/2017  Medication Sig  . carvedilol (COREG) 6.25 MG tablet TAKE 1 TABLET(6.25 MG) BY MOUTH TWICE DAILY WITH A MEAL  . losartan-hydrochlorothiazide (HYZAAR) 100-25 MG tablet TAKE 1 TABLET BY MOUTH DAILY  . Cholecalciferol 50000 units TABS Take 1 tablet by mouth  once a week. (Patient not taking: Reported on 04/20/2016)  . potassium chloride SA (K-DUR,KLOR-CON) 20 MEQ tablet Take 1 tablet (20 mEq total) by mouth daily. (Patient not taking: Reported on 04/20/2016)  . [DISCONTINUED] meclizine (ANTIVERT) 25 MG tablet Take 1 tablet (25 mg total) by mouth 3 (three) times daily as needed for dizziness. (Patient not taking: Reported on 04/20/2016)  . [DISCONTINUED] ondansetron (ZOFRAN) 4 MG tablet Take 1 tablet (4 mg total) by mouth every 8 (eight) hours as needed for nausea or vomiting. (Patient not taking: Reported on 04/20/2016)   No facility-administered encounter medications on file as of 02/22/2017.     Activities of Daily Living In your present state of health, do you have any difficulty performing the following activities: 02/22/2017  Hearing? Y  Vision? N  Difficulty concentrating or making decisions? N  Walking or climbing stairs? N  Dressing or bathing? N  Doing errands, shopping? N  Preparing Food and eating ? N  Using the Toilet? N  In the past six months, have you accidently leaked urine? N  Do you have problems with loss of bowel control? N  Managing your Medications? N  Managing your Finances? N  Housekeeping or managing your Housekeeping? N  Some recent data might be hidden    Patient Care Team: [name], MD as PCP - General  Assessment:    Physical assessment deferred to PCP.  Exercise Activities and Dietary recommendations Current Exercise Habits: Home exercise routine, Type of exercise: stretching;walking (stationary bike), Time (Minutes): 45, Frequency (Times/Week): 4, Weekly Exercise (Minutes/Week): 180, Intensity: Mild, Exercise limited by: None identified  Diet (meal preparation, eat out, water intake, caffeinated beverages, dairy products, fruits and vegetables): in general, a "healthy" diet  , well balanced, eats a variety of fruits and vegetables daily, limits salt, fat/cholesterol, sugar, caffeine, drinks 6-8 glasses  of water daily.  Goals    . maintain current health status          Eat healthy, exercise, enjoy family and life.      Fall Risk Fall Risk  02/22/2017 04/20/2016 09/17/2015 08/28/2014 06/04/2013  Falls in the past year? No No No No No   Depression Screen PHQ 2/9 Scores 02/22/2017 09/17/2015 08/28/2014 06/04/2013  PHQ - 2 Score 1 0 0 0  PHQ- 9 Score 1 - - -     Cognitive Function MMSE - Mini Mental State Exam 02/22/2017  Orientation to time 5  Orientation to Place 5  Registration 3  Attention/ Calculation 5  Recall 3  Language- name 2 objects 2  Language- repeat 1  Language- follow 3 step command 3  Language- read & follow direction 1  Write a sentence 1  Copy design 1  Total score 30        Immunization History  Administered Date(s) Administered  . Influenza Split 05/16/2011, 04/03/2012  . Pneumococcal Conjugate-13 04/29/2010  . Pneumococcal Polysaccharide-23 05/08/2014  . Td 06/20/2004, 12/05/2013  . Zoster 06/04/2013   Screening Tests Health Maintenance  Topic Date Due  . INFLUENZA VACCINE  09/18/2017 (Originally 01/18/2017)  . MAMMOGRAM  06/24/2017  . COLONOSCOPY  11/08/2022  . TETANUS/TDAP  12/06/2023  . DEXA SCAN  Completed  . Hepatitis C Screening  Completed  . PNA vac Low Risk Adult  Completed      Plan:    Continue doing brain stimulating activities (puzzles, reading, adult coloring books, staying active) to keep memory sharp.   Continue to eat heart healthy diet (full of fruits, vegetables, whole grains, lean protein, water--limit salt, fat, and sugar intake) and increase physical activity as tolerated.  I have personally reviewed and noted the following in the patient's chart:   . Medical and social history . Use of alcohol, tobacco or illicit drugs  . Current medications and supplements . Functional ability and status . Nutritional status . Physical activity . Advanced directives . List of other physicians . Vitals . Screenings to include  cognitive, depression, and falls . Referrals and appointments  In addition, I have reviewed and discussed with patient certain preventive protocols, quality metrics, and best practice recommendations. A written personalized care plan for preventive services as well as general preventive health recommendations were provided to patient.     Michiel Cowboy, RN  02/22/2017  Medical screening examination/treatment/procedure(s) were performed by non-physician practitioner and as supervising physician I was immediately available for consultation/collaboration. I agree with above. Scarlette Calico, MD

## 2017-02-22 NOTE — Progress Notes (Signed)
Subjective:  Patient ID: Jamie Villa, female    DOB: [birth year]  Age: 71 y.o. MRN: [id number]  CC: Hypertension and Hyperlipidemia   HPI Thirza Pellicano Hilburn presents for f/up - she feels well and offers no complaints.  Outpatient Medications Prior to Visit  Medication Sig Dispense Refill  . carvedilol (COREG) 6.25 MG tablet TAKE 1 TABLET(6.25 MG) BY MOUTH TWICE DAILY WITH A MEAL 180 tablet 0  . losartan-hydrochlorothiazide (HYZAAR) 100-25 MG tablet TAKE 1 TABLET BY MOUTH DAILY 90 tablet 0  . Cholecalciferol 50000 units TABS Take 1 tablet by mouth once a week. (Patient not taking: Reported on 04/20/2016) 12 tablet 3  . potassium chloride SA (K-DUR,KLOR-CON) 20 MEQ tablet Take 1 tablet (20 mEq total) by mouth daily. (Patient not taking: Reported on 04/20/2016) 30 tablet 11  . meclizine (ANTIVERT) 25 MG tablet Take 1 tablet (25 mg total) by mouth 3 (three) times daily as needed for dizziness. (Patient not taking: Reported on 04/20/2016) 65 tablet 1  . ondansetron (ZOFRAN) 4 MG tablet Take 1 tablet (4 mg total) by mouth every 8 (eight) hours as needed for nausea or vomiting. (Patient not taking: Reported on 04/20/2016) 20 tablet 0   No facility-administered medications prior to visit.     ROS Review of Systems  Constitutional: Negative for diaphoresis, fatigue and unexpected weight change.  HENT: Negative.   Eyes: Negative for visual disturbance.  Respiratory: Negative.  Negative for cough, chest tightness, shortness of breath and wheezing.   Cardiovascular: Negative for chest pain, palpitations and leg swelling.  Gastrointestinal: Negative for abdominal pain, constipation, diarrhea, nausea and vomiting.  Endocrine: Negative.   Genitourinary: Negative.  Negative for difficulty urinating.  Musculoskeletal: Negative.  Negative for back pain, myalgias and neck pain.  Skin: Negative.  Negative for color change.  Allergic/Immunologic: Negative.   Neurological: Negative.  Negative for  dizziness, weakness and headaches.  Hematological: Negative for adenopathy. Does not bruise/bleed easily.  Psychiatric/Behavioral: Negative.     Objective:  BP (!) 148/88 (BP Location: Left Arm, Patient Position: Sitting, Cuff Size: Normal)   Pulse 83   Temp 98 F (36.7 C) (Oral)   Resp 16   Ht 5\' 6"  (1.676 m)   Wt 165 lb (74.8 kg)   SpO2 98%   BMI 26.63 kg/m   BP Readings from Last 3 Encounters:  02/22/17 (!) 148/88  04/20/16 (!) 150/72  09/16/15 138/80    Wt Readings from Last 3 Encounters:  02/22/17 165 lb (74.8 kg)  04/20/16 166 lb 9 oz (75.6 kg)  09/16/15 166 lb (75.3 kg)    Physical Exam  Constitutional: She is oriented to person, place, and time. No distress.  HENT:  Mouth/Throat: Oropharynx is clear and moist. No oropharyngeal exudate.  Eyes: Conjunctivae are normal. Left eye exhibits no discharge. No scleral icterus.  Neck: Normal range of motion. Neck supple. No JVD present. No thyromegaly present.  Cardiovascular: Normal rate, regular rhythm and intact distal pulses.  Exam reveals no gallop and no friction rub.   No murmur heard. Pulmonary/Chest: Effort normal and breath sounds normal. No respiratory distress. She has no wheezes. She has no rales. She exhibits no tenderness.  Abdominal: Soft. Bowel sounds are normal. She exhibits no distension and no mass. There is no tenderness. There is no rebound and no guarding.  Musculoskeletal: Normal range of motion. She exhibits no edema, tenderness or deformity.  Neurological: She is alert and oriented to person, place, and time.  Skin: Skin is warm  and dry. No rash noted. She is not diaphoretic. No erythema. No pallor.  Psychiatric: She has a normal mood and affect. Her behavior is normal. Judgment and thought content normal.  Vitals reviewed.   Lab Results  Component Value Date   WBC 6.0 02/22/2017   HGB 13.4 02/22/2017   HCT 40.8 02/22/2017   PLT 212.0 02/22/2017   GLUCOSE 115 (H) 02/22/2017   CHOL 255 (H)  02/22/2017   TRIG 186.0 (H) 02/22/2017   HDL 64.90 02/22/2017   LDLDIRECT 153.0 08/27/2014   LDLCALC 152 (H) 02/22/2017   ALT 18 02/22/2017   AST 18 02/22/2017   NA 143 02/22/2017   K 3.5 02/22/2017   CL 102 02/22/2017   CREATININE 0.94 02/22/2017   BUN 26 (H) 02/22/2017   CO2 32 02/22/2017   TSH 3.50 02/22/2017   INR 0.90 10/14/2009   HGBA1C 5.8 09/16/2015    Mr Brain Wo Contrast  Result Date: 06/30/2015 CLINICAL DATA:  71 year old female with dizziness and nausea for 2 months, loss of balance. Initial encounter. Personal history of lymphoma in 2009 with recurrence in 2011. EXAM: MRI HEAD WITHOUT CONTRAST TECHNIQUE: Multiplanar, multiecho pulse sequences of the brain and surrounding structures were obtained without intravenous contrast. COMPARISON:  Neck CT 05/12/2008. FINDINGS: Cerebral volume is within normal limits for age. No restricted diffusion to suggest acute infarction. No midline shift, mass effect, evidence of mass lesion, ventriculomegaly, extra-axial collection or acute intracranial hemorrhage. Cervicomedullary junction and pituitary are within normal limits. Major intracranial vascular flow voids are within normal limits, dominant left vertebral artery re- demonstrated. Scattered mild for age nonspecific cerebral white matter T2 and FLAIR hyperintensity. No cortical encephalomalacia or chronic cerebral blood products. Deep gray matter nuclei, brainstem, and cerebellum are within normal limits. Visible internal auditory structures appear normal. Trace mastoid fluid on the left, appears inconsequential. Negative nasopharynx. Normal stylomastoid foramina. Paranasal sinuses are clear. Postoperative changes to both globes. Negative orbit and scalp soft tissues. Normal bone marrow signal. Negative visualized cervical spine. IMPRESSION: No acute intracranial abnormality and largely unremarkable for age noncontrast MRI appearance of the brain. Electronically Signed   By: Genevie Ann M.D.   On:  06/30/2015 16:36    Assessment & Plan:   Barbera was seen today for hypertension and hyperlipidemia.  Diagnoses and all orders for this visit:  Essential hypertension- her BP is well controlled, lytes and renal function are normal -     Comprehensive metabolic panel; Future -     CBC with Differential/Platelet; Future  Pure hypercholesterolemia- her ASCVD risk is 21% so I have asked her to start statin and asa for CV risk reduction -     Lipid panel; Future -     Comprehensive metabolic panel; Future -     TSH; Future -     rosuvastatin (CRESTOR) 10 MG tablet; Take 1 tablet (10 mg total) by mouth daily. -     aspirin EC 81 MG tablet; Take 1 tablet (81 mg total) by mouth daily.  Hyperglycemia- she is prediabetic, medical therapy is not indicated, she will work on her lifestyle modifications -     Comprehensive metabolic panel; Future  Encounter for Medicare annual wellness exam   I have discontinued Ms. Hilburn's meclizine and ondansetron. I am also having her start on rosuvastatin and aspirin EC. Additionally, I am having her maintain her potassium chloride SA, Cholecalciferol, losartan-hydrochlorothiazide, and carvedilol.  Meds ordered this encounter  Medications  . rosuvastatin (CRESTOR) 10 MG tablet  Sig: Take 1 tablet (10 mg total) by mouth daily.    Dispense:  90 tablet    Refill:  1  . aspirin EC 81 MG tablet    Sig: Take 1 tablet (81 mg total) by mouth daily.    Dispense:  90 tablet    Refill:  3     Follow-up: Return in about 6 months (around 08/22/2017).  Scarlette Calico, MD

## 2017-02-23 ENCOUNTER — Encounter: Payer: Self-pay | Admitting: Internal Medicine

## 2017-02-23 ENCOUNTER — Other Ambulatory Visit: Payer: Self-pay | Admitting: Internal Medicine

## 2017-02-24 ENCOUNTER — Telehealth: Payer: Self-pay | Admitting: Internal Medicine

## 2017-02-24 MED ORDER — LOSARTAN POTASSIUM-HCTZ 100-25 MG PO TABS: 1.0000 | ORAL_TABLET | Freq: Every day | ORAL | 1 refills | Status: DC

## 2017-02-24 NOTE — Telephone Encounter (Signed)
Patient requesting script for losartan to go to [facility] at [place].

## 2017-02-24 NOTE — Telephone Encounter (Signed)
Reviewed chart pt is up-to-date sent refills to [facility]../[facility]mb  

## 2017-05-24 ENCOUNTER — Ambulatory Visit (INDEPENDENT_AMBULATORY_CARE_PROVIDER_SITE_OTHER): Payer: Medicare Other | Admitting: Family

## 2017-05-24 ENCOUNTER — Encounter: Payer: Self-pay | Admitting: Family

## 2017-05-24 VITALS — BP 140/80 | HR 74 | Temp 97.9°F | Ht 66.0 in | Wt 164.0 lb

## 2017-05-24 DIAGNOSIS — J209 Acute bronchitis, unspecified: Secondary | ICD-10-CM

## 2017-05-24 DIAGNOSIS — J019 Acute sinusitis, unspecified: Secondary | ICD-10-CM | POA: Diagnosis not present

## 2017-05-24 MED ORDER — DOXYCYCLINE HYCLATE 100 MG PO TABS: 100.0000 mg | ORAL_TABLET | Freq: Two times a day (BID) | ORAL | 0 refills | Status: DC

## 2017-05-24 MED ORDER — FLUTICASONE PROPIONATE 50 MCG/ACT NA SUSP: 2.0000 | Freq: Every day | NASAL | 6 refills | Status: DC

## 2017-05-24 NOTE — Progress Notes (Signed)
Jamie Villa is a 71 y.o. female with the following history as recorded in Epic[employer]:  Patient Active Problem List   Diagnosis Date Noted  . Vasculitis ([place]) 04/07/2016  . Hypokalemia 05/21/2015  . Vitamin D deficiency 05/20/2015  . Routine general medical examination at a health care facility 06/04/2013  . Pure hypercholesterolemia 06/04/2013  . Hyperglycemia 06/04/2013  . 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 Essential hypertension 03/30/2010    Current Outpatient Medications  Medication Sig Dispense Refill  . aspirin EC 81 MG tablet Take 1 tablet (81 mg total) by mouth daily. 90 tablet 3  . carvedilol (COREG) 6.25 MG tablet TAKE 1 TABLET(6.25 MG) BY MOUTH TWICE DAILY WITH A MEAL 180 tablet 0  . Cholecalciferol 50000 units TABS Take 1 tablet by mouth once a week. 12 tablet 3  . losartan-hydrochlorothiazide (HYZAAR) 100-25 MG tablet Take 1 tablet by mouth daily. 90 tablet 1  . potassium chloride SA (K-DUR,KLOR-CON) 20 MEQ tablet Take 1 tablet (20 mEq total) by mouth daily. 30 tablet 11  . rosuvastatin (CRESTOR) 10 MG tablet Take 1 tablet (10 mg total) by mouth daily. 90 tablet 1  . doxycycline (VIBRA-TABS) 100 MG tablet Take 1 tablet (100 mg total) by mouth 2 (two) times daily. 20 tablet 0  . fluticasone (FLONASE) 50 MCG/ACT nasal spray Place 2 sprays into both nostrils daily. 16 g 6   No current facility-administered medications for this visit.     Allergies: Codeine and Penicillins  Past Medical History:  Diagnosis Date  . Hiatal hernia   . HTN (hypertension)   . Hx of colonic polyps   . Hypothyroid   . Non Hodgkin's lymphoma ([place])   . Pneumonia    Healthcare-associated versus community-acquired pneumonia    Past Surgical History:  Procedure Laterality Date  . ABDOMINAL HYSTERECTOMY    . S/P STEM CELL TRANSPLANT     Large B-Cell lymphoma    Family History  Problem Relation Age of Onset  . Hypertension Other   . Coronary  artery disease Neg Hx     Social History   Tobacco Use  . Smoking status: Never Smoker  . Smokeless tobacco: Never Used  Substance Use Topics  . Alcohol use: No    Subjective:  Patient presents with concerns for possible sinus infection. She notes her symptoms have been present for the past week. She has concerns for symptoms moving into her chest as well. She has been taking Tylenol and OTC Cough Syrup ( Delsym) for her symptoms. She is experiencing facial/ teeth pain. She does have a history of pneumonia but has not experienced any chest pain or shortness of breath.   Objective:  Vitals:   05/24/17 1102  BP: 140/80  Pulse: 74  Temp: 97.9 F (36.6 C)  SpO2: 100%  Weight: 164 lb (74.4 kg)  Height: 5\' 6"  (1.676 m)    General: Well developed, well nourished, in no acute distress  Skin : Warm and dry.  Head: Normocephalic and atraumatic  Eyes: Sclera and conjunctiva clear; pupils round and reactive to light; extraocular movements intact  Ears: External normal; canals clear; tympanic membranes congested bilaterally Oropharynx: Pink, supple. No suspicious lesions  Neck: Supple without thyromegaly, adenopathy  Lungs: Respirations unlabored; clear to auscultation bilaterally without wheeze, rales, rhonchi  CVS exam: normal rate and regular rhythm.  Neurologic: Alert and oriented; speech intact; face symmetrical; moves all extremities well; CNII-XII intact without focal deficit  Assessment:  1. Acute sinusitis, recurrence not specified, unspecified location   2. Acute bronchitis, unspecified organism     Plan:  Rx for Doxycycline 100 mg bid x 10 days; Rx for Flonase; increase fluids, rest; follow-up worse, no better.   No Follow-up on file.  No orders of the defined types were placed in this encounter.   Requested Prescriptions   Signed Prescriptions Disp Refills  . doxycycline (VIBRA-TABS) 100 MG tablet 20 tablet 0    Sig: Take 1 tablet (100 mg total) by mouth 2 (two) times  daily.  . fluticasone (FLONASE) 50 MCG/ACT nasal spray 16 g 6    Sig: Place 2 sprays into both nostrils daily.

## 2017-06-29 DIAGNOSIS — H40013 Open angle with borderline findings, low risk, bilateral: Secondary | ICD-10-CM | POA: Diagnosis not present

## 2017-06-29 DIAGNOSIS — H21233 Degeneration of iris (pigmentary), bilateral: Secondary | ICD-10-CM | POA: Diagnosis not present

## 2017-06-29 DIAGNOSIS — H26493 Other secondary cataract, bilateral: Secondary | ICD-10-CM | POA: Diagnosis not present

## 2017-06-29 DIAGNOSIS — H35373 Puckering of macula, bilateral: Secondary | ICD-10-CM | POA: Diagnosis not present

## 2017-07-13 DIAGNOSIS — H26492 Other secondary cataract, left eye: Secondary | ICD-10-CM | POA: Diagnosis not present

## 2017-07-17 DIAGNOSIS — Z9481 Bone marrow transplant status: Secondary | ICD-10-CM | POA: Diagnosis not present

## 2017-07-17 DIAGNOSIS — C8330 Diffuse large B-cell lymphoma, unspecified site: Secondary | ICD-10-CM | POA: Diagnosis not present

## 2017-07-17 DIAGNOSIS — Z8572 Personal history of non-Hodgkin lymphomas: Secondary | ICD-10-CM | POA: Diagnosis not present

## 2017-07-26 ENCOUNTER — Ambulatory Visit: Payer: Medicare Other | Admitting: Nurse Practitioner

## 2017-08-16 ENCOUNTER — Other Ambulatory Visit: Payer: Self-pay | Admitting: Internal Medicine

## 2017-08-16 DIAGNOSIS — I10 Essential (primary) hypertension: Secondary | ICD-10-CM

## 2017-08-16 DIAGNOSIS — E7800 Pure hypercholesterolemia, unspecified: Secondary | ICD-10-CM

## 2017-08-23 DIAGNOSIS — C8331 Diffuse large B-cell lymphoma, lymph nodes of head, face, and neck: Secondary | ICD-10-CM | POA: Diagnosis not present

## 2017-08-23 DIAGNOSIS — C8339 Diffuse large B-cell lymphoma, extranodal and solid organ sites: Secondary | ICD-10-CM | POA: Diagnosis not present

## 2017-08-23 DIAGNOSIS — C8330 Diffuse large B-cell lymphoma, unspecified site: Secondary | ICD-10-CM | POA: Diagnosis not present

## 2017-11-02 DIAGNOSIS — Z1211 Encounter for screening for malignant neoplasm of colon: Secondary | ICD-10-CM | POA: Diagnosis not present

## 2017-11-02 DIAGNOSIS — R1033 Periumbilical pain: Secondary | ICD-10-CM | POA: Diagnosis not present

## 2017-11-02 DIAGNOSIS — K5904 Chronic idiopathic constipation: Secondary | ICD-10-CM | POA: Diagnosis not present

## 2017-11-02 DIAGNOSIS — Z86010 Personal history of colonic polyps: Secondary | ICD-10-CM | POA: Diagnosis not present

## 2017-11-03 LAB — HEPATIC FUNCTION PANEL
ALK PHOS: 69 (ref 25–125)
ALT: 25 (ref 7–35)
AST: 21 (ref 13–35)
Bilirubin, Total: 0.6

## 2017-11-03 LAB — BASIC METABOLIC PANEL
BUN: 20 (ref 4–21)
CREATININE: 0.9 (ref 0.5–1.1)
Glucose: 76
POTASSIUM: 3.6 (ref 3.4–5.3)
Sodium: 145 (ref 137–147)

## 2017-11-03 LAB — CBC AND DIFFERENTIAL
HCT: 39 (ref 36–46)
Hemoglobin: 12.9 (ref 12.0–16.0)
PLATELETS: 208 (ref 150–399)
WBC: 5.9

## 2017-11-03 LAB — TSH: TSH: 5.62 (ref 0.41–5.90)

## 2017-11-10 ENCOUNTER — Other Ambulatory Visit: Payer: Self-pay | Admitting: Internal Medicine

## 2017-11-10 DIAGNOSIS — E7800 Pure hypercholesterolemia, unspecified: Secondary | ICD-10-CM

## 2017-11-15 ENCOUNTER — Other Ambulatory Visit: Payer: Self-pay | Admitting: Internal Medicine

## 2017-11-15 DIAGNOSIS — E7800 Pure hypercholesterolemia, unspecified: Secondary | ICD-10-CM

## 2017-11-17 ENCOUNTER — Other Ambulatory Visit: Payer: Self-pay | Admitting: Internal Medicine

## 2017-11-17 DIAGNOSIS — E7800 Pure hypercholesterolemia, unspecified: Secondary | ICD-10-CM

## 2017-11-17 NOTE — Telephone Encounter (Signed)
Pt is due for a follow up appt. Can you schedule.

## 2017-11-18 HISTORY — PX: COLONOSCOPY: SHX174

## 2017-11-20 NOTE — Telephone Encounter (Signed)
She needs to see PCP at next available time. I can send in enough to get her to that appt.

## 2017-11-20 NOTE — Telephone Encounter (Signed)
Patient scheduled to see Mickel Baas due to PCP being unavailable.

## 2017-11-20 NOTE — Telephone Encounter (Signed)
Appointment changed.  Scheduled with Dr Ronnald Ramp on 12/13/17.

## 2017-11-23 ENCOUNTER — Ambulatory Visit: Payer: Medicare Other | Admitting: Family

## 2017-11-29 ENCOUNTER — Telehealth: Payer: Self-pay | Admitting: Oncology

## 2017-11-29 NOTE — Telephone Encounter (Signed)
Faxed requested information to [facility] attn: [employer] at [phone] on 11/29/17, Release ID: [id number]

## 2017-12-06 ENCOUNTER — Encounter: Payer: Self-pay | Admitting: Internal Medicine

## 2017-12-06 DIAGNOSIS — D128 Benign neoplasm of rectum: Secondary | ICD-10-CM | POA: Diagnosis not present

## 2017-12-06 DIAGNOSIS — K635 Polyp of colon: Secondary | ICD-10-CM | POA: Diagnosis not present

## 2017-12-06 DIAGNOSIS — D123 Benign neoplasm of transverse colon: Secondary | ICD-10-CM | POA: Diagnosis not present

## 2017-12-06 DIAGNOSIS — Z1211 Encounter for screening for malignant neoplasm of colon: Secondary | ICD-10-CM | POA: Diagnosis not present

## 2017-12-06 DIAGNOSIS — D122 Benign neoplasm of ascending colon: Secondary | ICD-10-CM | POA: Diagnosis not present

## 2017-12-06 DIAGNOSIS — K621 Rectal polyp: Secondary | ICD-10-CM | POA: Diagnosis not present

## 2017-12-13 ENCOUNTER — Other Ambulatory Visit (INDEPENDENT_AMBULATORY_CARE_PROVIDER_SITE_OTHER): Payer: Medicare Other

## 2017-12-13 ENCOUNTER — Ambulatory Visit (INDEPENDENT_AMBULATORY_CARE_PROVIDER_SITE_OTHER): Payer: Medicare Other | Admitting: Internal Medicine

## 2017-12-13 ENCOUNTER — Encounter: Payer: Self-pay | Admitting: Internal Medicine

## 2017-12-13 VITALS — BP 150/80 | HR 79 | Temp 97.9°F | Resp 16 | Ht 66.0 in | Wt 165.5 lb

## 2017-12-13 DIAGNOSIS — F321 Major depressive disorder, single episode, moderate: Secondary | ICD-10-CM

## 2017-12-13 DIAGNOSIS — E7800 Pure hypercholesterolemia, unspecified: Secondary | ICD-10-CM

## 2017-12-13 DIAGNOSIS — I10 Essential (primary) hypertension: Secondary | ICD-10-CM

## 2017-12-13 DIAGNOSIS — T502X5A Adverse effect of carbonic-anhydrase inhibitors, benzothiadiazides and other diuretics, initial encounter: Secondary | ICD-10-CM | POA: Diagnosis not present

## 2017-12-13 DIAGNOSIS — R739 Hyperglycemia, unspecified: Secondary | ICD-10-CM | POA: Diagnosis not present

## 2017-12-13 DIAGNOSIS — E785 Hyperlipidemia, unspecified: Secondary | ICD-10-CM

## 2017-12-13 DIAGNOSIS — Z1231 Encounter for screening mammogram for malignant neoplasm of breast: Secondary | ICD-10-CM

## 2017-12-13 DIAGNOSIS — E876 Hypokalemia: Secondary | ICD-10-CM

## 2017-12-13 DIAGNOSIS — E559 Vitamin D deficiency, unspecified: Secondary | ICD-10-CM

## 2017-12-13 LAB — BASIC METABOLIC PANEL
BUN: 24 mg/dL — ABNORMAL HIGH (ref 6–23)
CO2: 33 meq/L — AB (ref 19–32)
Calcium: 9.7 mg/dL (ref 8.4–10.5)
Chloride: 103 mEq/L (ref 96–112)
Creatinine, Ser: 0.96 mg/dL (ref 0.40–1.20)
GFR: 60.42 mL/min (ref >60.00)
GLUCOSE: 119 mg/dL — AB (ref 70–99)
POTASSIUM: 3.2 meq/L — AB (ref 3.5–5.1)
Sodium: 143 mEq/L (ref 135–145)

## 2017-12-13 LAB — VITAMIN D 25 HYDROXY (VIT D DEFICIENCY, FRACTURES): VITD: 22.97 ng/mL — AB (ref 30.00–100.00)

## 2017-12-13 LAB — LIPID PANEL
CHOLESTEROL: 167 mg/dL (ref 0–200)
HDL: 69.2 mg/dL (ref >39.00)
NonHDL: 97.75
Total CHOL/HDL Ratio: 2
Triglycerides: 265 mg/dL — ABNORMAL HIGH (ref 0.0–149.0)
VLDL: 53 mg/dL — AB (ref 0.0–40.0)

## 2017-12-13 LAB — LDL CHOLESTEROL, DIRECT: Direct LDL: 60 mg/dL

## 2017-12-13 LAB — HEMOGLOBIN A1C: Hgb A1c MFr Bld: 6 % (ref 4.6–6.5)

## 2017-12-13 MED ORDER — VORTIOXETINE HBR 10 MG PO TABS: 10.0000 mg | ORAL_TABLET | Freq: Every day | ORAL | 1 refills | Status: DC

## 2017-12-13 NOTE — Progress Notes (Signed)
Subjective:  Patient ID: Jamie Villa, female    DOB: 1946/04/29  Age: 72 y.o. MRN: [id number]  CC: Hypertension and Hyperlipidemia   HPI Jamie Villa presents for f/up-  She continues to complain of grieving related to the death of her husband from 5 years ago.  She complains of anhedonia, crying spells, and ruminations.  She denies feeling hopeless or helpless and she denies SI or HI.  She tells me her blood pressure has been well controlled and she denies denies recent episodes of CP DOE, SOB, palpitations, edema, or fatigue.  She is not compliant with the vitamin D replacement therapy.  Outpatient Medications Prior to Visit  Medication Sig Dispense Refill  . aspirin EC 81 MG tablet Take 1 tablet (81 mg total) by mouth daily. 90 tablet 3  . carvedilol (COREG) 6.25 MG tablet TAKE 1 TABLET(6.25 MG) BY MOUTH TWICE DAILY WITH A MEAL 180 tablet 0  . losartan-hydrochlorothiazide (HYZAAR) 100-25 MG tablet Take 1 tablet by mouth daily. 30 tablet 0  . rosuvastatin (CRESTOR) 10 MG tablet Take 1 tablet (10 mg total) by mouth daily. 30 tablet 0  . Cholecalciferol 50000 units TABS Take 1 tablet by mouth once a week. 12 tablet 3  . doxycycline (VIBRA-TABS) 100 MG tablet Take 1 tablet (100 mg total) by mouth 2 (two) times daily. 20 tablet 0  . fluticasone (FLONASE) 50 MCG/ACT nasal spray Place 2 sprays into both nostrils daily. 16 g 6  . potassium chloride SA (K-DUR,KLOR-CON) 20 MEQ tablet Take 1 tablet (20 mEq total) by mouth daily. (Patient not taking: Reported on 12/13/2017) 30 tablet 11   No facility-administered medications prior to visit.     ROS Review of Systems  Constitutional: Positive for fatigue. Negative for appetite change, diaphoresis and unexpected weight change.  HENT: Negative.   Eyes: Negative for visual disturbance.  Respiratory: Negative for apnea, cough, chest tightness, shortness of breath and wheezing.   Cardiovascular: Negative for chest pain, palpitations and  leg swelling.  Gastrointestinal: Negative for abdominal pain, constipation, diarrhea, nausea and vomiting.  Genitourinary: Negative.  Negative for difficulty urinating.  Musculoskeletal: Negative.  Negative for arthralgias and myalgias.  Skin: Negative.  Negative for color change.  Neurological: Negative.  Negative for dizziness and light-headedness.  Hematological: Negative for adenopathy. Does not bruise/bleed easily.  Psychiatric/Behavioral: Positive for decreased concentration and dysphoric mood. Negative for behavioral problems, confusion, hallucinations, self-injury, sleep disturbance and suicidal ideas. The patient is not nervous/anxious and is not hyperactive.     Objective:  BP (!) 150/80 (BP Location: Left Arm, Patient Position: Sitting, Cuff Size: Normal)   Pulse 79   Temp 97.9 F (36.6 C) (Oral)   Resp 16   Ht 5\' 6"  (1.676 m)   Wt 165 lb 8 oz (75.1 kg)   SpO2 99%   BMI 26.71 kg/m   BP Readings from Last 3 Encounters:  12/13/17 (!) 150/80  05/24/17 140/80  02/22/17 (!) 148/88    Wt Readings from Last 3 Encounters:  12/13/17 165 lb 8 oz (75.1 kg)  05/24/17 164 lb (74.4 kg)  02/22/17 165 lb (74.8 kg)    Physical Exam  Constitutional: She is oriented to person, place, and time. No distress.  HENT:  Mouth/Throat: Oropharynx is clear and moist. No oropharyngeal exudate.  Eyes: Conjunctivae are normal. No scleral icterus.  Neck: Normal range of motion. Neck supple. No JVD present. No thyromegaly present.  Cardiovascular: Normal rate, regular rhythm and normal heart sounds. Exam reveals  no gallop.  No murmur heard. Pulmonary/Chest: Effort normal and breath sounds normal. No respiratory distress. She has no wheezes. She has no rales.  Abdominal: Soft. Normal appearance and bowel sounds are normal. She exhibits no mass. There is no hepatosplenomegaly. There is no tenderness. No hernia.  Musculoskeletal: Normal range of motion. She exhibits no edema, tenderness or  deformity.  Lymphadenopathy:    She has no cervical adenopathy.  Neurological: She is alert and oriented to person, place, and time.  Skin: Skin is warm and dry. She is not diaphoretic. No pallor.  Psychiatric: Judgment and thought content normal. Her mood appears anxious. Her affect is not inappropriate. Her speech is not rapid and/or pressured, not delayed and not tangential. She is slowed. She is not agitated, not withdrawn and not combative. Thought content is not paranoid. Cognition and memory are normal. She exhibits a depressed mood. She expresses no homicidal and no suicidal ideation.  Vitals reviewed.   Lab Results  Component Value Date   WBC 5.9 11/03/2017   HGB 12.9 11/03/2017   HCT 39 11/03/2017   PLT 208 11/03/2017   GLUCOSE 119 (H) 12/13/2017   CHOL 167 12/13/2017   TRIG 265.0 (H) 12/13/2017   HDL 69.20 12/13/2017   LDLDIRECT 60.0 12/13/2017   LDLCALC 152 (H) 02/22/2017   ALT 25 11/03/2017   AST 21 11/03/2017   NA 143 12/13/2017   K 3.2 (L) 12/13/2017   CL 103 12/13/2017   CREATININE 0.96 12/13/2017   BUN 24 (H) 12/13/2017   CO2 33 (H) 12/13/2017   TSH 5.62 11/03/2017   INR 0.90 10/14/2009   HGBA1C 6.0 12/13/2017    Mr Brain Wo Contrast  Result Date: 06/30/2015 CLINICAL DATA:  72 year old female with dizziness and nausea for 2 months, loss of balance. Initial encounter. Personal history of lymphoma in 2009 with recurrence in 2011. EXAM: MRI HEAD WITHOUT CONTRAST TECHNIQUE: Multiplanar, multiecho pulse sequences of the brain and surrounding structures were obtained without intravenous contrast. COMPARISON:  Neck CT 05/12/2008. FINDINGS: Cerebral volume is within normal limits for age. No restricted diffusion to suggest acute infarction. No midline shift, mass effect, evidence of mass lesion, ventriculomegaly, extra-axial collection or acute intracranial hemorrhage. Cervicomedullary junction and pituitary are within normal limits. Major intracranial vascular flow voids  are within normal limits, dominant left vertebral artery re- demonstrated. Scattered mild for age nonspecific cerebral white matter T2 and FLAIR hyperintensity. No cortical encephalomalacia or chronic cerebral blood products. Deep gray matter nuclei, brainstem, and cerebellum are within normal limits. Visible internal auditory structures appear normal. Trace mastoid fluid on the left, appears inconsequential. Negative nasopharynx. Normal stylomastoid foramina. Paranasal sinuses are clear. Postoperative changes to both globes. Negative orbit and scalp soft tissues. Normal bone marrow signal. Negative visualized cervical spine. IMPRESSION: No acute intracranial abnormality and largely unremarkable for age noncontrast MRI appearance of the brain. Electronically Signed   By: Genevie Ann M.D.   On: 06/30/2015 16:36    Assessment & Plan:   Zoe was seen today for hypertension and hyperlipidemia.  Diagnoses and all orders for this visit:  Essential hypertension- Her blood pressure is not adequately well controlled and she has developed significant hypokalemia on the thiazide diuretic.  I have asked her to change to a potassium sparing diuretic and will continue an ARB.  I will also treat the vitamin D deficiency. -     Basic metabolic panel; Future -     spironolactone (ALDACTONE) 25 MG tablet; Take 1 tablet (25 mg  total) by mouth daily. -     olmesartan (BENICAR) 20 MG tablet; Take 1 tablet (20 mg total) by mouth daily.  Hyperglycemia- Her A1c is at 6.0%.  She has prediabetes but medical therapy is not indicated. -     Basic metabolic panel; Future -     Hemoglobin A1c; Future  Vitamin D deficiency -     VITAMIN D 25 Hydroxy (Vit-D Deficiency, Fractures); Future -     Cholecalciferol 50000 units TABS; Take 1 tablet by mouth once a week.  Dyslipidemia, goal LDL below 100- She has achieved her LDL goal and is doing well on the statin. -     Lipid panel; Future  Current moderate episode of major  depressive disorder without prior episode (HCC) -     vortioxetine HBr (TRINTELLIX) 10 MG TABS tablet; Take 1 tablet (10 mg total) by mouth daily.  Visit for screening mammogram -     MM DIGITAL SCREENING BILATERAL; Future  Diuretic-induced hypokalemia -     spironolactone (ALDACTONE) 25 MG tablet; Take 1 tablet (25 mg total) by mouth daily.   I have discontinued Tenna Child. Villa's potassium chloride SA, doxycycline, fluticasone, and losartan-hydrochlorothiazide. I am also having her start on vortioxetine HBr, spironolactone, and olmesartan. Additionally, I am having her maintain her aspirin EC, carvedilol, Cholecalciferol, and rosuvastatin.  Meds ordered this encounter  Medications  . vortioxetine HBr (TRINTELLIX) 10 MG TABS tablet    Sig: Take 1 tablet (10 mg total) by mouth daily.    Dispense:  90 tablet    Refill:  1  . spironolactone (ALDACTONE) 25 MG tablet    Sig: Take 1 tablet (25 mg total) by mouth daily.    Dispense:  90 tablet    Refill:  0  . olmesartan (BENICAR) 20 MG tablet    Sig: Take 1 tablet (20 mg total) by mouth daily.    Dispense:  90 tablet    Refill:  0  . Cholecalciferol 50000 units TABS    Sig: Take 1 tablet by mouth once a week.    Dispense:  12 tablet    Refill:  1  . rosuvastatin (CRESTOR) 10 MG tablet    Sig: Take 1 tablet (10 mg total) by mouth daily.    Dispense:  90 tablet    Refill:  1     Follow-up: Return in about 6 weeks (around 01/24/2018).  Scarlette Calico, MD

## 2017-12-13 NOTE — Patient Instructions (Signed)
Major Depressive Disorder, Adult Major depressive disorder (MDD) is a mental health condition. It may also be called clinical depression or unipolar depression. MDD usually causes feelings of sadness, hopelessness, or helplessness. MDD can also cause physical symptoms. It can interfere with work, school, relationships, and other everyday activities. MDD may be mild, moderate, or severe. It may occur once (single episode major depressive disorder) or it may occur multiple times (recurrent major depressive disorder). What are the causes? The exact cause of this condition is not known. MDD is most likely caused by a combination of things, which may include:  Genetic factors. These are traits that are passed along from parent to child.  Individual factors. Your personality, your behavior, and the way you handle your thoughts and feelings may contribute to MDD. This includes personality traits and behaviors learned from others.  Physical factors, such as: ? Differences in the part of your brain that controls emotion. This part of your brain may be different than it is in people who do not have MDD. ? Long-term (chronic) medical or psychiatric illnesses.  Social factors. Traumatic experiences or major life changes may play a role in the development of MDD.  What increases the risk? This condition is more likely to develop in women. The following factors may also make you more likely to develop MDD:  A family history of depression.  Troubled family relationships.  Abnormally low levels of certain brain chemicals.  Traumatic events in childhood, especially abuse or the loss of a parent.  Being under a lot of stress, or long-term stress, especially from upsetting life experiences or losses.  A history of: ? Chronic physical illness. ? Other mental health disorders. ? Substance abuse.  Poor living conditions.  Experiencing social exclusion or discrimination on a regular basis.  What are  the signs or symptoms? The main symptoms of MDD typically include:  Constant depressed or irritable mood.  Loss of interest in things and activities.  MDD symptoms may also include:  Sleeping or eating too much or too little.  Unexplained weight change.  Fatigue or low energy.  Feelings of worthlessness or guilt.  Difficulty thinking clearly or making decisions.  Thoughts of suicide or of harming others.  Physical agitation or weakness.  Isolation.  Severe cases of MDD may also occur with other symptoms, such as:  Delusions or hallucinations, in which you imagine things that are not real (psychotic depression).  Low-level depression that lasts at least a year (chronic depression or persistent depressive disorder).  Extreme sadness and hopelessness (melancholic depression).  Trouble speaking and moving (catatonic depression).  How is this diagnosed? This condition may be diagnosed based on:  Your symptoms.  Your medical history, including your mental health history. This may involve tests to evaluate your mental health. You may be asked questions about your lifestyle, including any drug and alcohol use, and how long you have had symptoms of MDD.  A physical exam.  Blood tests to rule out other conditions.  You must have a depressed mood and at least four other MDD symptoms most of the day, nearly every day in the same 2-week timeframe before your health care provider can confirm a diagnosis of MDD. How is this treated? This condition is usually treated by mental health professionals, such as psychologists, psychiatrists, and clinical social workers. You may need more than one type of treatment. Treatment may include:  Psychotherapy. This is also called talk therapy or counseling. Types of psychotherapy include: ? Cognitive behavioral   therapy (CBT). This type of therapy teaches you to recognize unhealthy feelings, thoughts, and behaviors, and replace them with  positive thoughts and actions. ? Interpersonal therapy (IPT). This helps you to improve the way you relate to and communicate with others. ? Family therapy. This treatment includes members of your family.  Medicine to treat anxiety and depression, or to help you control certain emotions and behaviors.  Lifestyle changes, such as: ? Limiting alcohol and drug use. ? Exercising regularly. ? Getting plenty of sleep. ? Making healthy eating choices. ? Spending more time outdoors.  Treatments involving stimulation of the brain can be used in situations with extremely severe symptoms, or when medicine or other therapies do not work over time. These treatments include electroconvulsive therapy, transcranial magnetic stimulation, and vagal nerve stimulation. Follow these instructions at home: Activity  Return to your normal activities as told by your health care provider.  Exercise regularly and spend time outdoors as told by your health care provider. General instructions  Take over-the-counter and prescription medicines only as told by your health care provider.  Do not drink alcohol. If you drink alcohol, limit your alcohol intake to no more than 1 drink a day for nonpregnant women and 2 drinks a day for men. One drink equals 12 oz of beer, 5 oz of wine, or 1 oz of hard liquor. Alcohol can affect any antidepressant medicines you are taking. Talk to your health care provider about your alcohol use.  Eat a healthy diet and get plenty of sleep.  Find activities that you enjoy doing, and make time to do them.  Consider joining a support group. Your health care provider may be able to recommend a support group.  Keep all follow-up visits as told by your health care provider. This is important. Where to find more information: National Alliance on Mental Illness  www.nami.org  [place] National Institute of Mental Health  www.nimh.nih.gov  National Suicide Prevention  Lifeline  1-800-273-TALK (8255). This is free, 24-hour help.  Contact a health care provider if:  Your symptoms get worse.  You develop new symptoms. Get help right away if:  You self-harm.  You have serious thoughts about hurting yourself or others.  You see, hear, taste, smell, or feel things that are not present (hallucinate). This information is not intended to replace advice given to you by your health care provider. Make sure you discuss any questions you have with your health care provider. Document Released: 10/01/2012 Document Revised: 02/11/2016 Document Reviewed: 12/16/2015 Elsevier Interactive Patient Education  2018 Elsevier Inc.  

## 2017-12-14 MED ORDER — SPIRONOLACTONE 25 MG PO TABS: 25.0000 mg | ORAL_TABLET | Freq: Every day | ORAL | 0 refills | Status: DC

## 2017-12-14 MED ORDER — ROSUVASTATIN CALCIUM 10 MG PO TABS: 10.0000 mg | ORAL_TABLET | Freq: Every day | ORAL | 1 refills | Status: DC

## 2017-12-14 MED ORDER — OLMESARTAN MEDOXOMIL 20 MG PO TABS: 20.0000 mg | ORAL_TABLET | Freq: Every day | ORAL | 0 refills | Status: DC

## 2017-12-14 MED ORDER — CHOLECALCIFEROL 1.25 MG (50000 UT) PO TABS: 1.0000 | ORAL_TABLET | ORAL | 1 refills | Status: DC

## 2017-12-16 ENCOUNTER — Telehealth: Payer: Self-pay | Admitting: Internal Medicine

## 2017-12-16 NOTE — Telephone Encounter (Signed)
Per [facility] Message:  Caller states she had blood work done on Wednesday. She is wanting results. She got medications that are different than what she has been taking.

## 2017-12-19 ENCOUNTER — Other Ambulatory Visit: Payer: Self-pay | Admitting: Internal Medicine

## 2017-12-27 DIAGNOSIS — M6530 Trigger finger, unspecified finger: Secondary | ICD-10-CM | POA: Diagnosis not present

## 2017-12-27 DIAGNOSIS — M79645 Pain in left finger(s): Secondary | ICD-10-CM | POA: Diagnosis not present

## 2018-01-18 ENCOUNTER — Encounter: Payer: Self-pay | Admitting: Internal Medicine

## 2018-01-24 DIAGNOSIS — M65312 Trigger thumb, left thumb: Secondary | ICD-10-CM | POA: Diagnosis not present

## 2018-01-24 DIAGNOSIS — M79645 Pain in left finger(s): Secondary | ICD-10-CM | POA: Diagnosis not present

## 2018-02-07 ENCOUNTER — Ambulatory Visit
Admission: RE | Admit: 2018-02-07 | Discharge: 2018-02-07 | Disposition: A | Discharge status: Home / Self Care | Payer: Medicare Other | Source: Ambulatory Visit | Attending: Internal Medicine | Admitting: Internal Medicine

## 2018-02-07 DIAGNOSIS — Z1231 Encounter for screening mammogram for malignant neoplasm of breast: Secondary | ICD-10-CM

## 2018-02-08 ENCOUNTER — Ambulatory Visit: Payer: Medicare Other

## 2018-02-08 LAB — HM MAMMOGRAPHY

## 2018-02-22 DIAGNOSIS — H21233 Degeneration of iris (pigmentary), bilateral: Secondary | ICD-10-CM | POA: Diagnosis not present

## 2018-02-22 DIAGNOSIS — H04123 Dry eye syndrome of bilateral lacrimal glands: Secondary | ICD-10-CM | POA: Diagnosis not present

## 2018-02-22 DIAGNOSIS — H1851 Endothelial corneal dystrophy: Secondary | ICD-10-CM | POA: Diagnosis not present

## 2018-02-22 DIAGNOSIS — H40013 Open angle with borderline findings, low risk, bilateral: Secondary | ICD-10-CM | POA: Diagnosis not present

## 2018-03-01 NOTE — Progress Notes (Addendum)
Subjective:   Jamie Villa is a 72 y.o. female who presents for Medicare Annual (Subsequent) preventive examination.  Review of Systems:  No ROS.  Medicare Wellness Visit. Additional risk factors are reflected in the social history.  Cardiac Risk Factors include: advanced age (>98men, >62 women);dyslipidemia;hypertension Sleep patterns: feels rested on waking, gets up 1 times nightly to void and sleeps 8-9 hours nightly.    Home Safety/Smoke Alarms: Feels safe in home. Smoke alarms in place.  Living environment; residence and Firearm Safety: 2-story house, no firearms. Lives alone, no needs for DME, good support system Seat Belt Safety/Bike Helmet: Wears seat belt.      Objective:     Vitals: BP 112/62   Pulse 90   Resp 17   Ht 5\' 6"  (1.676 m)   Wt 166 lb (75.3 kg)   SpO2 98%   BMI 26.79 kg/m   Body mass index is 26.79 kg/m.  Advanced Directives 03/02/2018 02/22/2017 09/17/2015  Does Patient Have a Medical Advance Directive? Yes Yes Yes  Type of Paramedic of [place];Living will [place];Living will [place];Living will  Does patient want to make changes to medical advance directive? - - No - Patient declined  Copy of [place] in Chart? No - copy requested No - copy requested Yes    Tobacco Social History   Tobacco Use  Smoking Status Never Smoker  Smokeless Tobacco Never Used     Counseling given: Not Answered  Past Medical History:  Diagnosis Date  . Hiatal hernia   . HTN (hypertension)   . Hx of colonic polyps   . Hypothyroid   . Pneumonia    Healthcare-associated versus community-acquired pneumonia   Past Surgical History:  Procedure Laterality Date  . ABDOMINAL HYSTERECTOMY    . S/P STEM CELL TRANSPLANT     Large B-Cell lymphoma   Family History  Problem Relation Age of Onset  . Hypertension Other   . Coronary artery disease Neg Hx    Social History    Socioeconomic History  . Marital status: Widowed    Spouse name: Not on file  . Number of children: 2  . Years of education: Not on file  . Highest education level: Not on file  Occupational History  . 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 Financial resource strain: Not hard at all  . Food insecurity:    Worry: Never true    Inability: Never true  . Transportation needs:    Medical: No    Non-medical: No  Tobacco Use  . Smoking status: Never Smoker  . Smokeless tobacco: Never Used  Substance and Sexual Activity  . Alcohol use: No  . Drug use: No  . Sexual activity: Not Currently  Lifestyle  . Physical activity:    Days per week: 3 days    Minutes per session: 40 min  . Stress: Only a little  Relationships  . Social connections:    Talks on phone: More than three times a week    Gets together: More than three times a week    Attends religious service: 1 to 4 times per year    Active member of club or organization: Yes    Attends meetings of clubs or organizations: More than 4 times per year    Relationship status: Widowed  Other Topics Concern  . Not on file  Social History Narrative   Married and  currently lives with husband.    Outpatient Encounter Medications as of 03/02/2018  Medication Sig  . Cholecalciferol 50000 units TABS Take 1 tablet by mouth once a week.  . olmesartan (BENICAR) 20 MG tablet Take 1 tablet (20 mg total) by mouth daily.  . rosuvastatin (CRESTOR) 10 MG tablet Take 1 tablet (10 mg total) by mouth daily.  Marland Kitchen spironolactone (ALDACTONE) 25 MG tablet Take 1 tablet (25 mg total) by mouth daily.  . [DISCONTINUED] aspirin EC 81 MG tablet Take 1 tablet (81 mg total) by mouth daily. (Patient not taking: Reported on 03/02/2018)  . [DISCONTINUED] carvedilol (COREG) 6.25 MG tablet TAKE 1 TABLET(6.25 MG) BY MOUTH TWICE DAILY WITH A MEAL (Patient not taking: Reported on 03/02/2018)  . [DISCONTINUED] vortioxetine HBr (TRINTELLIX) 10 MG  TABS tablet Take 1 tablet (10 mg total) by mouth daily. (Patient not taking: Reported on 03/02/2018)   No facility-administered encounter medications on file as of 03/02/2018.     Activities of Daily Living In your present state of health, do you have any difficulty performing the following activities: 03/02/2018  Hearing? N  Vision? N  Difficulty concentrating or making decisions? N  Walking or climbing stairs? N  Dressing or bathing? N  Doing errands, shopping? N  Preparing Food and eating ? N  Using the Toilet? N  In the past six months, have you accidently leaked urine? N  Do you have problems with loss of bowel control? N  Managing your Medications? N  Managing your Finances? N  Housekeeping or managing your Housekeeping? N  Some recent data might be hidden    Patient Care Team: [name], MD as PCP - General    Assessment:   This is a routine wellness examination for [place]. Physical assessment deferred to PCP.   Exercise Activities and Dietary recommendations Current Exercise Habits: Home exercise routine, Type of exercise: walking(Stationary bike), Time (Minutes): 35, Frequency (Times/Week): 3, Weekly Exercise (Minutes/Week): 105, Intensity: Mild, Exercise limited by: None identified  Diet (meal preparation, eat out, water intake, caffeinated beverages, dairy products, fruits and vegetables): in general, a "healthy" diet  , well balanced   Reviewed heart healthy diet. Encouraged patient to increase daily water and healthy fluid intake.  Goals    . maintain current health status     Eat healthy, exercise, enjoy family and life.    . Patient Stated     Maintain current life status, continue to enjoy life and love family.       Fall Risk Fall Risk  03/02/2018 02/22/2017 04/20/2016 09/17/2015 08/28/2014  Falls in the past year? No No No No No    Depression Screen PHQ 2/9 Scores 03/02/2018 02/22/2017 09/17/2015 08/28/2014  PHQ - 2 Score 1 1 0 0  PHQ- 9 Score 2 1 - -       Cognitive Function MMSE - Mini Mental State Exam 02/22/2017  Orientation to time 5  Orientation to Place 5  Registration 3  Attention/ Calculation 5  Recall 3  Language- name 2 objects 2  Language- repeat 1  Language- follow 3 step command 3  Language- read & follow direction 1  Write a sentence 1  Copy design 1  Total score 30       Ad8 score reviewed for issues:  Issues making decisions: no  Less interest in hobbies / activities: no  Repeats questions, stories (family complaining): no  Trouble using ordinary gadgets (microwave, computer, phone):no  Forgets the month or year: no  Mismanaging finances: no  Remembering appts: no  Daily problems with thinking and/or memory: no Ad8 score is= 0  Immunization History  Administered Date(s) Administered  . Influenza Split 05/16/2011, 04/03/2012  . Pneumococcal Conjugate-13 04/29/2010  . Pneumococcal Polysaccharide-23 05/08/2014  . Td 06/20/2004, 12/05/2013  . Zoster 06/04/2013   Screening Tests Health Maintenance  Topic Date Due  . INFLUENZA VACCINE  10/20/2018 (Originally 01/18/2018)  . MAMMOGRAM  02/09/2020  . COLONOSCOPY  12/07/2022  . TETANUS/TDAP  12/06/2023  . DEXA SCAN  Completed  . Hepatitis C Screening  Completed  . PNA vac Low Risk Adult  Completed      Plan:     Continue doing brain stimulating activities (puzzles, reading, adult coloring books, staying active) to keep memory sharp.   Continue to eat heart healthy diet (full of fruits, vegetables, whole grains, lean protein, water--limit salt, fat, and sugar intake) and increase physical activity as tolerated.  I have personally reviewed and noted the following in the patient's chart:   . Medical and social history . Use of alcohol, tobacco or illicit drugs  . Current medications and supplements . Functional ability and status . Nutritional status . Physical activity . Advanced directives . List of other physicians . Vitals . Screenings  to include cognitive, depression, and falls . Referrals and appointments  In addition, I have reviewed and discussed with patient certain preventive protocols, quality metrics, and best practice recommendations. A written personalized care plan for preventive services as well as general preventive health recommendations were provided to patient.     Michiel Cowboy, RN  03/02/2018  Medical screening examination/treatment/procedure(s) were performed by non-physician practitioner and as supervising provider I was immediately available for consultation/collaboration.  I agree with above. Marrian Salvage, FNP

## 2018-03-02 ENCOUNTER — Other Ambulatory Visit (HOSPITAL_COMMUNITY): Payer: Self-pay | Admitting: Ophthalmology

## 2018-03-02 ENCOUNTER — Ambulatory Visit (INDEPENDENT_AMBULATORY_CARE_PROVIDER_SITE_OTHER): Payer: Medicare Other | Admitting: *Deleted

## 2018-03-02 VITALS — BP 112/62 | HR 90 | Resp 17 | Ht 66.0 in | Wt 166.0 lb

## 2018-03-02 DIAGNOSIS — Z0000 Encounter for general adult medical examination without abnormal findings: Secondary | ICD-10-CM | POA: Diagnosis not present

## 2018-03-02 DIAGNOSIS — H3560 Retinal hemorrhage, unspecified eye: Secondary | ICD-10-CM

## 2018-03-02 NOTE — Patient Instructions (Addendum)
Continue doing brain stimulating activities (puzzles, reading, adult coloring books, staying active) to keep memory sharp.   Continue to eat heart healthy diet (full of fruits, vegetables, whole grains, lean protein, water--limit salt, fat, and sugar intake) and increase physical activity as tolerated.   [name] , Thank you for taking time to come for your Medicare Wellness Visit. I appreciate your ongoing commitment to your health goals. Please review the following plan we discussed and let me know if I can assist you in the future.   These are the goals we discussed: Goals    . maintain current health status     Eat healthy, exercise, enjoy family and life.    . Patient Stated     Maintain current life status, continue to enjoy life and love family.       This is a list of the screening recommended for you and due dates:  Health Maintenance  Topic Date Due  . Flu Shot  10/20/2018*  . Mammogram  02/09/2020  . Colon Cancer Screening  12/07/2022  . Tetanus Vaccine  12/06/2023  . DEXA scan (bone density measurement)  Completed  .  Hepatitis C: One time screening is recommended by Center for Disease Control  (CDC) for  adults born from [birth year] through 1967.   Completed  . Pneumonia vaccines  Completed  *Topic was postponed. The date shown is not the original due date.    Health Maintenance, Female Adopting a healthy lifestyle and getting preventive care can go a long way to promote health and wellness. Talk with your health care provider about what schedule of regular examinations is right for you. This is a good chance for you to check in with your provider about disease prevention and staying healthy. In between checkups, there are plenty of things you can do on your own. Experts have done a lot of research about which lifestyle changes and preventive measures are most likely to keep you healthy. Ask your health care provider for more information. Weight and diet Eat a healthy  diet  Be sure to include plenty of vegetables, fruits, low-fat dairy products, and lean protein.  Do not eat a lot of foods high in solid fats, added sugars, or salt.  Get regular exercise. This is one of the most important things you can do for your health. ? Most adults should exercise for at least 150 minutes each week. The exercise should increase your heart rate and make you sweat (moderate-intensity exercise). ? Most adults should also do strengthening exercises at least twice a week. This is in addition to the moderate-intensity exercise.  Maintain a healthy weight  Body mass index (BMI) is a measurement that can be used to identify possible weight problems. It estimates body fat based on height and weight. Your health care provider can help determine your BMI and help you achieve or maintain a healthy weight.  For females 20 years of age and older: ? A BMI below 18.5 is considered underweight. ? A BMI of 18.5 to 24.9 is normal. ? A BMI of 25 to 29.9 is considered overweight. ? A BMI of 30 and above is considered obese.  Watch levels of cholesterol and blood lipids  You should start having your blood tested for lipids and cholesterol at 74 years of age, then have this test every 5 years.  You may need to have your cholesterol levels checked more often if: ? Your lipid or cholesterol levels are high. ? You are  older than 74 years of age. ? You are at high risk for heart disease.  Cancer screening Lung Cancer  Lung cancer screening is recommended for adults 33-97 years old who are at high risk for lung cancer because of a history of smoking.  A yearly low-dose CT scan of the lungs is recommended for people who: ? Currently smoke. ? Have quit within the past 15 years. ? Have at least a 30-pack-year history of smoking. A pack year is smoking an average of one pack of cigarettes a day for 1 year.  Yearly screening should continue until it has been 15 years since you  quit.  Yearly screening should stop if you develop a health problem that would prevent you from having lung cancer treatment.  Breast Cancer  Practice breast self-awareness. This means understanding how your breasts normally appear and feel.  It also means doing regular breast self-exams. Let your health care provider know about any changes, no matter how small.  If you are in your 20s or 30s, you should have a clinical breast exam (CBE) by a health care provider every 1-3 years as part of a regular health exam.  If you are 50 or older, have a CBE every year. Also consider having a breast X-ray (mammogram) every year.  If you have a family history of breast cancer, talk to your health care provider about genetic screening.  If you are at high risk for breast cancer, talk to your health care provider about having an MRI and a mammogram every year.  Breast cancer gene (BRCA) assessment is recommended for women who have family members with BRCA-related cancers. BRCA-related cancers include: ? Breast. ? Ovarian. ? Tubal. ? Peritoneal cancers.  Results of the assessment will determine the need for genetic counseling and BRCA1 and BRCA2 testing.  Cervical Cancer Your health care provider may recommend that you be screened regularly for cancer of the pelvic organs (ovaries, uterus, and vagina). This screening involves a pelvic examination, including checking for microscopic changes to the surface of your cervix (Pap test). You may be encouraged to have this screening done every 3 years, beginning at age 13.  For women ages 65-65, health care providers may recommend pelvic exams and Pap testing every 3 years, or they may recommend the Pap and pelvic exam, combined with testing for human papilloma virus (HPV), every 5 years. Some types of HPV increase your risk of cervical cancer. Testing for HPV may also be done on women of any age with unclear Pap test results.  Other health care providers  may not recommend any screening for nonpregnant women who are considered low risk for pelvic cancer and who do not have symptoms. Ask your health care provider if a screening pelvic exam is right for you.  If you have had past treatment for cervical cancer or a condition that could lead to cancer, you need Pap tests and screening for cancer for at least 20 years after your treatment. If Pap tests have been discontinued, your risk factors (such as having a new sexual partner) need to be reassessed to determine if screening should resume. Some women have medical problems that increase the chance of getting cervical cancer. In these cases, your health care provider may recommend more frequent screening and Pap tests.  Colorectal Cancer  This type of cancer can be detected and often prevented.  Routine colorectal cancer screening usually begins at 74 years of age and continues through 74 years of age.  Your health care provider may recommend screening at an earlier age if you have risk factors for colon cancer.  Your health care provider may also recommend using home test kits to check for hidden blood in the stool.  A small camera at the end of a tube can be used to examine your colon directly (sigmoidoscopy or colonoscopy). This is done to check for the earliest forms of colorectal cancer.  Routine screening usually begins at age 37.  Direct examination of the colon should be repeated every 5-10 years through 74 years of age. However, you may need to be screened more often if early forms of precancerous polyps or small growths are found.  Skin Cancer  Check your skin from head to toe regularly.  Tell your health care provider about any new moles or changes in moles, especially if there is a change in a mole's shape or color.  Also tell your health care provider if you have a mole that is larger than the size of a pencil eraser.  Always use sunscreen. Apply sunscreen liberally and repeatedly  throughout the day.  Protect yourself by wearing long sleeves, pants, a wide-brimmed hat, and sunglasses whenever you are outside.  Heart disease, diabetes, and high blood pressure  High blood pressure causes heart disease and increases the risk of stroke. High blood pressure is more likely to develop in: ? People who have blood pressure in the high end of the normal range (130-139/85-89 mm Hg). ? People who are overweight or obese. ? People who are African American.  If you are 48-25 years of age, have your blood pressure checked every 3-5 years. If you are 18 years of age or older, have your blood pressure checked every year. You should have your blood pressure measured twice-once when you are at a hospital or clinic, and once when you are not at a hospital or clinic. Record the average of the two measurements. To check your blood pressure when you are not at a hospital or clinic, you can use: ? An automated blood pressure machine at a pharmacy. ? A home blood pressure monitor.  If you are between 33 years and 20 years old, ask your health care provider if you should take aspirin to prevent strokes.  Have regular diabetes screenings. This involves taking a blood sample to check your fasting blood sugar level. ? If you are at a normal weight and have a low risk for diabetes, have this test once every three years after 74 years of age. ? If you are overweight and have a high risk for diabetes, consider being tested at a younger age or more often. Preventing infection Hepatitis B  If you have a higher risk for hepatitis B, you should be screened for this virus. You are considered at high risk for hepatitis B if: ? You were born in a country where hepatitis B is common. Ask your health care provider which countries are considered high risk. ? Your parents were born in a high-risk country, and you have not been immunized against hepatitis B (hepatitis B vaccine). ? You have HIV or AIDS. ? You  use needles to inject street drugs. ? You live with someone who has hepatitis B. ? You have had sex with someone who has hepatitis B. ? You get hemodialysis treatment. ? You take certain medicines for conditions, including cancer, organ transplantation, and autoimmune conditions.  Hepatitis C  Blood testing is recommended for: ? Everyone born from [birth year] through  1967. ? Anyone with known risk factors for hepatitis C.  Sexually transmitted infections (STIs)  You should be screened for sexually transmitted infections (STIs) including gonorrhea and chlamydia if: ? You are sexually active and are younger than 73 years of age. ? You are older than 74 years of age and your health care provider tells you that you are at risk for this type of infection. ? Your sexual activity has changed since you were last screened and you are at an increased risk for chlamydia or gonorrhea. Ask your health care provider if you are at risk.  If you do not have HIV, but are at risk, it may be recommended that you take a prescription medicine daily to prevent HIV infection. This is called pre-exposure prophylaxis (PrEP). You are considered at risk if: ? You are sexually active and do not regularly use condoms or know the HIV status of your partner(s). ? You take drugs by injection. ? You are sexually active with a partner who has HIV.  Talk with your health care provider about whether you are at high risk of being infected with HIV. If you choose to begin PrEP, you should first be tested for HIV. You should then be tested every 3 months for as long as you are taking PrEP. Pregnancy  If you are premenopausal and you may become pregnant, ask your health care provider about preconception counseling.  If you may become pregnant, take 400 to 800 micrograms (mcg) of folic acid every day.  If you want to prevent pregnancy, talk to your health care provider about birth control (contraception). Osteoporosis and  menopause  Osteoporosis is a disease in which the bones lose minerals and strength with aging. This can result in serious bone fractures. Your risk for osteoporosis can be identified using a bone density scan.  If you are 36 years of age or older, or if you are at risk for osteoporosis and fractures, ask your health care provider if you should be screened.  Ask your health care provider whether you should take a calcium or vitamin D supplement to lower your risk for osteoporosis.  Menopause may have certain physical symptoms and risks.  Hormone replacement therapy may reduce some of these symptoms and risks. Talk to your health care provider about whether hormone replacement therapy is right for you. Follow these instructions at home:  Schedule regular health, dental, and eye exams.  Stay current with your immunizations.  Do not use any tobacco products including cigarettes, chewing tobacco, or electronic cigarettes.  If you are pregnant, do not drink alcohol.  If you are breastfeeding, limit how much and how often you drink alcohol.  Limit alcohol intake to no more than 1 drink per day for nonpregnant women. One drink equals 12 ounces of beer, 5 ounces of wine, or 1 ounces of hard liquor.  Do not use street drugs.  Do not share needles.  Ask your health care provider for help if you need support or information about quitting drugs.  Tell your health care provider if you often feel depressed.  Tell your health care provider if you have ever been abused or do not feel safe at home. This information is not intended to replace advice given to you by your health care provider. Make sure you discuss any questions you have with your health care provider. Document Released: 12/20/2010 Document Revised: 11/12/2015 Document Reviewed: 03/10/2015 Elsevier Interactive Patient Education  [employer].

## 2018-03-05 ENCOUNTER — Inpatient Hospital Stay (HOSPITAL_COMMUNITY): Admission: RE | Admit: 2018-03-05 | Payer: Medicare Other | Source: Ambulatory Visit

## 2018-03-12 ENCOUNTER — Encounter: Payer: Self-pay | Admitting: Family

## 2018-03-12 ENCOUNTER — Ambulatory Visit (INDEPENDENT_AMBULATORY_CARE_PROVIDER_SITE_OTHER): Payer: Medicare Other | Admitting: Family

## 2018-03-12 ENCOUNTER — Other Ambulatory Visit (INDEPENDENT_AMBULATORY_CARE_PROVIDER_SITE_OTHER): Payer: Medicare Other

## 2018-03-12 ENCOUNTER — Other Ambulatory Visit: Payer: Self-pay | Admitting: Family

## 2018-03-12 VITALS — BP 104/62 | HR 100 | Temp 97.6°F | Ht 66.0 in | Wt 167.0 lb

## 2018-03-12 DIAGNOSIS — T502X5A Adverse effect of carbonic-anhydrase inhibitors, benzothiadiazides and other diuretics, initial encounter: Secondary | ICD-10-CM | POA: Diagnosis not present

## 2018-03-12 DIAGNOSIS — R1013 Epigastric pain: Secondary | ICD-10-CM

## 2018-03-12 DIAGNOSIS — E785 Hyperlipidemia, unspecified: Secondary | ICD-10-CM

## 2018-03-12 DIAGNOSIS — I10 Essential (primary) hypertension: Secondary | ICD-10-CM

## 2018-03-12 DIAGNOSIS — E876 Hypokalemia: Secondary | ICD-10-CM

## 2018-03-12 LAB — CBC WITH DIFFERENTIAL/PLATELET
BASOS PCT: 0.2 % (ref 0.0–3.0)
Basophils Absolute: 0 10*3/uL (ref 0.0–0.1)
EOS PCT: 0.6 % (ref 0.0–5.0)
Eosinophils Absolute: 0.1 10*3/uL (ref 0.0–0.7)
HCT: 38 % (ref 36.0–46.0)
Hemoglobin: 12.7 g/dL (ref 12.0–15.0)
LYMPHS ABS: 1.8 10*3/uL (ref 0.7–4.0)
Lymphocytes Relative: 15.8 % (ref 12.0–46.0)
MCHC: 33.5 g/dL (ref 30.0–36.0)
MCV: 96.4 fl (ref 78.0–100.0)
MONO ABS: 1.2 10*3/uL — AB (ref 0.1–1.0)
Monocytes Relative: 10.2 % (ref 3.0–12.0)
NEUTROS PCT: 73.2 % (ref 43.0–77.0)
Neutro Abs: 8.2 10*3/uL — ABNORMAL HIGH (ref 1.4–7.7)
Platelets: 199 10*3/uL (ref 150.0–400.0)
RBC: 3.94 Mil/uL (ref 3.87–5.11)
RDW: 13.3 % (ref 11.5–15.5)
WBC: 11.2 10*3/uL — ABNORMAL HIGH (ref 4.0–10.5)

## 2018-03-12 LAB — COMPREHENSIVE METABOLIC PANEL
ALT: 18 U/L (ref 0–35)
AST: 16 U/L (ref 0–37)
Albumin: 4.1 g/dL (ref 3.5–5.2)
Alkaline Phosphatase: 57 U/L (ref 39–117)
BUN: 34 mg/dL — AB (ref 6–23)
CHLORIDE: 105 meq/L (ref 96–112)
CO2: 29 mEq/L (ref 19–32)
Calcium: 9.6 mg/dL (ref 8.4–10.5)
Creatinine, Ser: 1.2 mg/dL (ref 0.40–1.20)
GFR: 46.67 mL/min — ABNORMAL LOW (ref >60.00)
GLUCOSE: 93 mg/dL (ref 70–99)
Potassium: 4.2 mEq/L (ref 3.5–5.1)
SODIUM: 141 meq/L (ref 135–145)
TOTAL PROTEIN: 6.9 g/dL (ref 6.0–8.3)
Total Bilirubin: 0.7 mg/dL (ref 0.2–1.2)

## 2018-03-12 MED ORDER — SPIRONOLACTONE 25 MG PO TABS: 25.0000 mg | ORAL_TABLET | Freq: Every day | ORAL | 0 refills | Status: DC

## 2018-03-12 MED ORDER — PANTOPRAZOLE SODIUM 40 MG PO TBEC: 40.0000 mg | DELAYED_RELEASE_TABLET | Freq: Every day | ORAL | 0 refills | Status: DC

## 2018-03-12 MED ORDER — ROSUVASTATIN CALCIUM 10 MG PO TABS: 10.0000 mg | ORAL_TABLET | Freq: Every day | ORAL | 0 refills | Status: DC

## 2018-03-12 MED ORDER — OLMESARTAN MEDOXOMIL 20 MG PO TABS: 20.0000 mg | ORAL_TABLET | Freq: Every day | ORAL | 0 refills | Status: DC

## 2018-03-12 NOTE — Progress Notes (Signed)
Jamie Villa is a 72 y.o. female with the following history as recorded in Epic[employer]:  Patient Active Problem List   Diagnosis Date Noted  . Current moderate episode of major depressive disorder without prior episode ([place]) 12/13/2017  . Vasculitis ([place]) 04/07/2016  . Diuretic-induced hypokalemia 05/21/2015  . Vitamin D deficiency 05/20/2015  . Visit for screening mammogram 06/04/2013  . Dyslipidemia, goal LDL below 100 06/04/2013  . Hyperglycemia 06/04/2013  . Non-Hodgkin lymphoma ([place]) 01/18/2012  . 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 Essential hypertension 03/30/2010    Current Outpatient Medications  Medication Sig Dispense Refill  . Cholecalciferol (VITAMIN D3) 50000 units CAPS TK 1 T PO 1 TIME A WK  1  . Cholecalciferol 50000 units TABS Take 1 tablet by mouth once a week. 12 tablet 1  . olmesartan (BENICAR) 20 MG tablet Take 1 tablet (20 mg total) by mouth daily. 90 tablet 0  . rosuvastatin (CRESTOR) 10 MG tablet Take 1 tablet (10 mg total) by mouth daily. 90 tablet 0  . spironolactone (ALDACTONE) 25 MG tablet Take 1 tablet (25 mg total) by mouth daily. 90 tablet 0  . pantoprazole (PROTONIX) 40 MG tablet Take 1 tablet (40 mg total) by mouth daily. 30 tablet 0   No current facility-administered medications for this visit.     Allergies: Codeine and Penicillins  Past Medical History:  Diagnosis Date  . Hiatal hernia   . HTN (hypertension)   . Hx of colonic polyps   . Hypothyroid   . Pneumonia    Healthcare-associated versus community-acquired pneumonia    Past Surgical History:  Procedure Laterality Date  . ABDOMINAL HYSTERECTOMY    . S/P STEM CELL TRANSPLANT     Large B-Cell lymphoma    Family History  Problem Relation Age of Onset  . Hypertension Other   . Coronary artery disease Neg Hx     Social History   Tobacco Use  . Smoking status: Never Smoker   . Smokeless tobacco: Never Used  Substance Use Topics  . Alcohol use: No    Subjective:  Woke up last night with sudden onset of chills/ sweat and abdominal cramps; "feels very tight." Ate dinner out last night- ate chicken teryaki, rice; had "very hard" stool early this morning; feeling somewhat better this afternoon; no vomiting; able to eat [place] for lunch; does know that she has a history of gallstones- was found on a PET Scan; gallstone were originally found in 2013;  known hiatal hernia;   Objective:  Vitals:   03/12/18 1446  BP: 104/62  Pulse: 100  Temp: 97.6 F (36.4 C)  TempSrc: Oral  SpO2: 93%  Weight: 167 lb (75.8 kg)  Height: _0  (1.676 m)    General: Well developed, well nourished, in no acute distress  Skin : Warm and dry.  Head: Normocephalic and atraumatic  Lungs: Respirations unlabored; clear to auscultation bilaterally without wheeze, rales, rhonchi  CVS exam: normal rate and regular rhythm.  Abdomen: Soft; nontender; nondistended; normoactive bowel sounds; no masses or hepatosplenomegaly  Neurologic: Alert and oriented; speech intact; face symmetrical; moves all extremities well; CNII-XII intact without focal deficit  Assessment:  1. Epigastric pain   2. Essential hypertension   3. Dyslipidemia, goal LDL below 100   4. Diuretic-induced hypokalemia     Plan:  1. ? Related to hiatal hernia or gallbladder attack; known gallstones;  update ultrasound; check labs today; trial of Protonix 40 mg daily; follow-up to be determined. 2. Refills updated- plan to see her PCP in 1 month; 3. Refill updated on Crestor- re-check lipids in 1 month; 4. Rx for Spironalactone; see PCP in 1 month.   Return in about 1 month (around 04/11/2018) for with Dr. Ronnald Ramp; cancel for Wednesday.  Orders Placed This Encounter  Procedures  . US Abdomen Complete    Standing Status:   Future    Standing Expiration Date:   05/13/2019    Order Specific Question:   Reason for Exam  (SYMPTOM  OR DIAGNOSIS REQUIRED)    Answer:   abdominal pain    Order Specific Question:   Preferred imaging location?    Answer:   [facility]  . CBC w/Diff    Standing Status:   Future    Number of Occurrences:   1    Standing Expiration Date:   03/12/2019  . Comp Met (CMET)    Standing Status:   Future    Number of Occurrences:   1    Standing Expiration Date:   03/12/2019    Requested Prescriptions   Signed Prescriptions Disp Refills  . pantoprazole (PROTONIX) 40 MG tablet 30 tablet 0    Sig: Take 1 tablet (40 mg total) by mouth daily.  Marland Kitchen olmesartan (BENICAR) 20 MG tablet 90 tablet 0    Sig: Take 1 tablet (20 mg total) by mouth daily.  . rosuvastatin (CRESTOR) 10 MG tablet 90 tablet 0    Sig: Take 1 tablet (10 mg total) by mouth daily.  Marland Kitchen spironolactone (ALDACTONE) 25 MG tablet 90 tablet 0    Sig: Take 1 tablet (25 mg total) by mouth daily.

## 2018-03-14 ENCOUNTER — Ambulatory Visit: Payer: Medicare Other | Admitting: Internal Medicine

## 2018-03-20 ENCOUNTER — Ambulatory Visit
Admission: RE | Admit: 2018-03-20 | Discharge: 2018-03-20 | Disposition: A | Discharge status: Home / Self Care | Payer: Medicare Other | Source: Ambulatory Visit | Attending: Family | Admitting: Family

## 2018-03-20 ENCOUNTER — Other Ambulatory Visit: Payer: Self-pay | Admitting: Family

## 2018-03-20 DIAGNOSIS — K8020 Calculus of gallbladder without cholecystitis without obstruction: Secondary | ICD-10-CM | POA: Diagnosis not present

## 2018-03-20 DIAGNOSIS — R1013 Epigastric pain: Secondary | ICD-10-CM

## 2018-03-21 ENCOUNTER — Telehealth: Payer: Self-pay | Admitting: Family

## 2018-03-21 ENCOUNTER — Telehealth: Payer: Self-pay | Admitting: Internal Medicine

## 2018-03-21 NOTE — Telephone Encounter (Signed)
Patient has called back in regard to results.  I have given patient Jamie Villa's response to ultrasound results.  Patient does want to have the referral placed with a surgeon and her cell phone used as primary contact.  Patient states she has been feeling ok for now but also has been avoiding spicy foods.

## 2018-03-21 NOTE — Telephone Encounter (Signed)
Copied from [place] [id number]. Topic: Quick Communication - Other Results >> Mar 21, 2018  1:42 PM Hewitt Shorts wrote: Pt went yesterday for her ultrasound and she was looking for the results   Best number [phone]

## 2018-03-22 NOTE — Telephone Encounter (Signed)
Referral placed on 03/20/18 for surgeon.

## 2018-04-04 DIAGNOSIS — K8020 Calculus of gallbladder without cholecystitis without obstruction: Secondary | ICD-10-CM | POA: Diagnosis not present

## 2018-04-07 DIAGNOSIS — J209 Acute bronchitis, unspecified: Secondary | ICD-10-CM | POA: Diagnosis not present

## 2018-04-07 DIAGNOSIS — R52 Pain, unspecified: Secondary | ICD-10-CM | POA: Diagnosis not present

## 2018-04-07 DIAGNOSIS — R0989 Other specified symptoms and signs involving the circulatory and respiratory systems: Secondary | ICD-10-CM | POA: Diagnosis not present

## 2018-04-09 ENCOUNTER — Telehealth: Payer: Self-pay | Admitting: Internal Medicine

## 2018-04-09 ENCOUNTER — Ambulatory Visit (INDEPENDENT_AMBULATORY_CARE_PROVIDER_SITE_OTHER): Payer: Medicare Other | Admitting: Internal Medicine

## 2018-04-09 ENCOUNTER — Encounter: Payer: Self-pay | Admitting: Internal Medicine

## 2018-04-09 ENCOUNTER — Other Ambulatory Visit (INDEPENDENT_AMBULATORY_CARE_PROVIDER_SITE_OTHER): Payer: Medicare Other

## 2018-04-09 ENCOUNTER — Ambulatory Visit (INDEPENDENT_AMBULATORY_CARE_PROVIDER_SITE_OTHER)
Admission: RE | Admit: 2018-04-09 | Discharge: 2018-04-09 | Disposition: A | Discharge status: Home / Self Care | Payer: Medicare Other | Source: Ambulatory Visit | Attending: Internal Medicine | Admitting: Internal Medicine

## 2018-04-09 ENCOUNTER — Other Ambulatory Visit: Payer: Self-pay | Admitting: Internal Medicine

## 2018-04-09 DIAGNOSIS — R0602 Shortness of breath: Secondary | ICD-10-CM

## 2018-04-09 DIAGNOSIS — R05 Cough: Secondary | ICD-10-CM

## 2018-04-09 DIAGNOSIS — R059 Cough, unspecified: Secondary | ICD-10-CM

## 2018-04-09 DIAGNOSIS — R197 Diarrhea, unspecified: Secondary | ICD-10-CM

## 2018-04-09 DIAGNOSIS — R062 Wheezing: Secondary | ICD-10-CM | POA: Diagnosis not present

## 2018-04-09 LAB — CBC WITH DIFFERENTIAL/PLATELET
Basophils Absolute: 0 10*3/uL (ref 0.0–0.1)
Basophils Relative: 0.4 % (ref 0.0–3.0)
EOS ABS: 0.1 10*3/uL (ref 0.0–0.7)
EOS PCT: 0.8 % (ref 0.0–5.0)
HCT: 40.1 % (ref 36.0–46.0)
HEMOGLOBIN: 13.7 g/dL (ref 12.0–15.0)
Lymphocytes Relative: 17.1 % (ref 12.0–46.0)
Lymphs Abs: 1.7 10*3/uL (ref 0.7–4.0)
MCHC: 34.1 g/dL (ref 30.0–36.0)
MCV: 95.7 fl (ref 78.0–100.0)
MONO ABS: 1.1 10*3/uL — AB (ref 0.1–1.0)
Monocytes Relative: 10.8 % (ref 3.0–12.0)
Neutro Abs: 7.1 10*3/uL (ref 1.4–7.7)
Neutrophils Relative %: 70.9 % (ref 43.0–77.0)
Platelets: 205 10*3/uL (ref 150.0–400.0)
RBC: 4.19 Mil/uL (ref 3.87–5.11)
RDW: 13 % (ref 11.5–15.5)
WBC: 10 10*3/uL (ref 4.0–10.5)

## 2018-04-09 LAB — COMPREHENSIVE METABOLIC PANEL
ALT: 17 U/L (ref 0–35)
AST: 19 U/L (ref 0–37)
Albumin: 4.4 g/dL (ref 3.5–5.2)
Alkaline Phosphatase: 68 U/L (ref 39–117)
BUN: 32 mg/dL — AB (ref 6–23)
CALCIUM: 10.1 mg/dL (ref 8.4–10.5)
CO2: 25 mEq/L (ref 19–32)
CREATININE: 1.32 mg/dL — AB (ref 0.40–1.20)
Chloride: 103 mEq/L (ref 96–112)
GFR: 41.8 mL/min — ABNORMAL LOW (ref >60.00)
Glucose, Bld: 108 mg/dL — ABNORMAL HIGH (ref 70–99)
POTASSIUM: 3.5 meq/L (ref 3.5–5.1)
SODIUM: 140 meq/L (ref 135–145)
TOTAL PROTEIN: 7.9 g/dL (ref 6.0–8.3)
Total Bilirubin: 0.9 mg/dL (ref 0.2–1.2)

## 2018-04-09 LAB — POCT INFLUENZA A/B
Influenza A, POC: NEGATIVE
Influenza B, POC: NEGATIVE

## 2018-04-09 NOTE — Assessment & Plan Note (Signed)
She is experiencing cough, wheezing, shortness of breath, chest tightness for the past week She was evaluated at urgent care 2 days ago-chest x-ray negative, flu test negative No improvement in symptoms She does appear moderately ill Sounds more viral in nature, but concerned about early pneumonia/bronchitis Will recheck chest x-ray May need an antibiotic Continue increased fluids, Tessalon Perles as needed, Mucinex Call if no improvement

## 2018-04-09 NOTE — Assessment & Plan Note (Signed)
Started 1 week ago to like illness will retest for influenza A/P Symptomatic treatment Continue increased fluids Encouraged bland diet to help with forming stool  CMP/CBC

## 2018-04-09 NOTE — Patient Instructions (Addendum)
Have a chest x-ray today.  Have blood work done today. We will call you with the results and send in treatment depending on the results.     You were tested for flu today.

## 2018-04-09 NOTE — Telephone Encounter (Signed)
Noted  

## 2018-04-09 NOTE — Progress Notes (Signed)
Subjective:    Patient ID: Jamie Villa, female    DOB: 05-Jun-1946, 72 y.o.   MRN: [id number]  HPI She is here for an acute visit for cold symptoms.  Her symptoms started one week ago.   She started with a cough and thought she had a cold.  She started taking vitamin c and a otc cough medication.  She has had some mild chest tightness, shortness of breath and wheezing.  She states chills, slight sore throat, headaches and lightheadedness.  The diarrhea has been intermittent for one week. She goes three times a day generally.   She denies any abdominal pain or blood in the stool.  She does have some nausea, but no vomiting.  She is not eating much, but is trying to drink water, cranberry juice and Gatorade.  She went to urgent care two days ago.   She had a chest x-ray and tested for flu at that day and both were negative.  She was diagnosed with a cold and was prescribed tessalon perles.  She has not felt any better, which is why she is here.  She has been taking Tessalon Perles, Mucinex and Tylenol.  She does have a history of asthma.  Medications and allergies reviewed with patient and updated if appropriate.  Patient Active Problem List   Diagnosis Date Noted  . Current moderate episode of major depressive disorder without prior episode ([place]) 12/13/2017  . Vasculitis ([place]) 04/07/2016  . Diuretic-induced hypokalemia 05/21/2015  . Vitamin D deficiency 05/20/2015  . Visit for screening mammogram 06/04/2013  . Dyslipidemia, goal LDL below 100 06/04/2013  . Hyperglycemia 06/04/2013  . Non-Hodgkin lymphoma ([place]) 01/18/2012  . 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[place]) 01/18/2012  . 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 Essential hypertension 03/30/2010    Current Outpatient Medications on File Prior to Visit  Medication Sig Dispense Refill  . Cholecalciferol (VITAMIN D3) 50000 units CAPS TK 1 T PO 1 TIME A WK  1  .  Cholecalciferol 50000 units TABS Take 1 tablet by mouth once a week. 12 tablet 1  . olmesartan (BENICAR) 20 MG tablet Take 1 tablet (20 mg total) by mouth daily. 90 tablet 0  . pantoprazole (PROTONIX) 40 MG tablet TAKE 1 TABLET(40 MG) BY MOUTH DAILY 90 tablet 0  . rosuvastatin (CRESTOR) 10 MG tablet Take 1 tablet (10 mg total) by mouth daily. 90 tablet 0  . spironolactone (ALDACTONE) 25 MG tablet Take 1 tablet (25 mg total) by mouth daily. 90 tablet 0   No current facility-administered medications on file prior to visit.     Past Medical History:  Diagnosis Date  . Hiatal hernia   . HTN (hypertension)   . Hx of colonic polyps   . Hypothyroid   . Pneumonia    Healthcare-associated versus community-acquired pneumonia    Past Surgical History:  Procedure Laterality Date  . ABDOMINAL HYSTERECTOMY    . S/P STEM CELL TRANSPLANT     Large B-Cell lymphoma    Social History   Socioeconomic History  . Marital status: Widowed    Spouse name: Not on file  . Number of children: 2  . Years of education: Not on file  . Highest education level: Not on file  Occupational History  . 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 Financial resource strain: Not hard at all  . Food insecurity:    Worry: Never  true    Inability: Never true  . Transportation needs:    Medical: No    Non-medical: No  Tobacco Use  . Smoking status: Never Smoker  . Smokeless tobacco: Never Used  Substance and Sexual Activity  . Alcohol use: No  . Drug use: No  . Sexual activity: Not Currently  Lifestyle  . Physical activity:    Days per week: 3 days    Minutes per session: 40 min  . Stress: Only a little  Relationships  . Social connections:    Talks on phone: More than three times a week    Gets together: More than three times a week    Attends religious service: 1 to 4 times per year    Active member of club or organization: Yes    Attends meetings of clubs or organizations: More  than 4 times per year    Relationship status: Widowed  Other Topics Concern  . Not on file  Social History Narrative   Married and currently lives with husband.    Family History  Problem Relation Age of Onset  . Hypertension Other   . Coronary artery disease Neg Hx     Review of Systems  Constitutional: Positive for appetite change and chills. Negative for fever (has not tested her temp).  HENT: Positive for sore throat (slight - ? from cough). Negative for congestion, ear pain, sinus pressure and sinus pain.   Respiratory: Positive for cough (dry - deep), chest tightness, shortness of breath (mild) and wheezing (mild).   Cardiovascular: Negative for chest pain.  Gastrointestinal: Positive for diarrhea (intermittent x 1 week) and nausea. Negative for abdominal pain and vomiting.  Musculoskeletal: Negative for myalgias.  Neurological: Positive for light-headedness and headaches.       Objective:   Vitals:   04/09/18 1015  BP: 122/70  Pulse: 74  Resp: 20  Temp: (!) 97.5 F (36.4 C)  SpO2: 96%   Filed Weights   04/09/18 1015  Weight: 160 lb 12.8 oz (72.9 kg)   Body mass index is 25.95 kg/m.  Wt Readings from Last 3 Encounters:  04/09/18 160 lb 12.8 oz (72.9 kg)  03/12/18 167 lb (75.8 kg)  03/02/18 166 lb (75.3 kg)     Physical Exam GENERAL APPEARANCE: Appears moderately ill, NAD EYES: conjunctiva clear, no icterus HEENT: bilateral tympanic membranes and ear canals normal, oropharynx with mild erythema, no thyromegaly, trachea midline, no cervical or supraclavicular lymphadenopathy LUNGS: Clear to auscultation without wheeze or crackles, unlabored breathing, good air entry bilaterally CARDIOVASCULAR: Normal S1,S2 without murmurs, no edema Abdomen: Soft, nontender, nondistended SKIN: warm, dry        Assessment & Plan:   See Problem List for Assessment and Plan of chronic medical problems.

## 2018-04-09 NOTE — Telephone Encounter (Signed)
Copied from [place] [id number]. Topic: Quick Communication - See Telephone Encounter >> Apr 09, 2018  2:45 PM Bea Graff, NT wrote: CRM for notification. See Telephone encounter for: 04/09/18. Pt states that she has a z-pac that she is already taking that she received from urgent care this morning and she does not need Dr. Quay Burow to send one in.

## 2018-04-09 NOTE — Telephone Encounter (Signed)
Please call her with the results-kidney function is slightly worse, which is likely related to mild dehydration.  Blood counts are normal.  Test for flu is negative.  Chest x-ray shows no infection.    We can consider trying a Z-Pak for a bad upper respiratory infection.  I think she should consider an antinausea medication if she is not keeping fluid down.  Ideally she needs to start eating a little bit more, which may also help her feel better and make her stools more solid.  If she is not feeling better over the next couple of days with the antibiotic she should let us know.  Please send Z-Pak to preferred pharmacy.

## 2018-04-09 NOTE — Telephone Encounter (Signed)
Spoke with pt. Pt states after appt today she received call from Urgent Care that she was seen at on Saturday, stating she has some signs of pneumonia. They have called in a antibiotic for her. States she she has mixed emotions on what to do. Advised pt that if antibiotic that urgent care called in was a Z-pack, she could go ahead and start. Did not send in antibiotic at this time.

## 2018-04-11 ENCOUNTER — Encounter: Payer: Self-pay | Admitting: Internal Medicine

## 2018-04-11 ENCOUNTER — Ambulatory Visit (INDEPENDENT_AMBULATORY_CARE_PROVIDER_SITE_OTHER): Payer: Medicare Other | Admitting: Internal Medicine

## 2018-04-11 VITALS — BP 118/74 | HR 72 | Temp 97.6°F | Resp 16 | Ht 66.0 in | Wt 161.0 lb

## 2018-04-11 DIAGNOSIS — E876 Hypokalemia: Secondary | ICD-10-CM

## 2018-04-11 DIAGNOSIS — T502X5A Adverse effect of carbonic-anhydrase inhibitors, benzothiadiazides and other diuretics, initial encounter: Secondary | ICD-10-CM | POA: Diagnosis not present

## 2018-04-11 DIAGNOSIS — I10 Essential (primary) hypertension: Secondary | ICD-10-CM

## 2018-04-11 DIAGNOSIS — B9789 Other viral agents as the cause of diseases classified elsewhere: Secondary | ICD-10-CM

## 2018-04-11 DIAGNOSIS — J069 Acute upper respiratory infection, unspecified: Secondary | ICD-10-CM

## 2018-04-11 MED ORDER — PROMETHAZINE-DM 6.25-15 MG/5ML PO SYRP: 5.0000 mL | ORAL_SOLUTION | Freq: Four times a day (QID) | ORAL | 0 refills | Status: AC | PRN

## 2018-04-11 MED ORDER — OLMESARTAN MEDOXOMIL 20 MG PO TABS: 20.0000 mg | ORAL_TABLET | Freq: Every day | ORAL | 1 refills | Status: DC

## 2018-04-11 MED ORDER — SPIRONOLACTONE 25 MG PO TABS: 25.0000 mg | ORAL_TABLET | Freq: Every day | ORAL | 1 refills | Status: DC

## 2018-04-11 NOTE — Patient Instructions (Signed)

## 2018-04-11 NOTE — Progress Notes (Signed)
Subjective:  Patient ID: Jamie Villa, female    DOB: [birth year]  Age: 72 y.o. MRN: [id number]  CC: Cough   HPI Jamie Villa presents for f/up - She complains of about a 10-day history of cough, chills, and nausea.  She tells me that she has seen several other providers and has had 2 chest x-rays both of which were normal.  She has been prescribed Zithromax, Tessalon Perles, and Mucinex DM but she is not feeling any better.  She says the cough is productive of white phlegm.  She denies fever, night sweats, chest pain, or hemoptysis.  Outpatient Medications Prior to Visit  Medication Sig Dispense Refill  . Cholecalciferol 50000 units TABS Take 1 tablet by mouth once a week. 12 tablet 1  . pantoprazole (PROTONIX) 40 MG tablet TAKE 1 TABLET(40 MG) BY MOUTH DAILY 90 tablet 0  . rosuvastatin (CRESTOR) 10 MG tablet Take 1 tablet (10 mg total) by mouth daily. 90 tablet 0  . Cholecalciferol (VITAMIN D3) 50000 units CAPS TK 1 T PO 1 TIME A WK  1  . olmesartan (BENICAR) 20 MG tablet Take 1 tablet (20 mg total) by mouth daily. 90 tablet 0  . spironolactone (ALDACTONE) 25 MG tablet Take 1 tablet (25 mg total) by mouth daily. 90 tablet 0  . azithromycin (ZITHROMAX) 250 MG tablet TK UTD  0  . benzonatate (TESSALON) 100 MG capsule TK 1 C PO QID PRN  0   No facility-administered medications prior to visit.     ROS Review of Systems  Constitutional: Positive for chills. Negative for fatigue and fever.  HENT: Negative.  Negative for sore throat and trouble swallowing.   Eyes: Negative.   Respiratory: Positive for cough. Negative for chest tightness, shortness of breath and wheezing.   Cardiovascular: Negative for chest pain, palpitations and leg swelling.  Gastrointestinal: Positive for nausea. Negative for abdominal pain, constipation, diarrhea and vomiting.  Endocrine: Negative.   Genitourinary: Negative.  Negative for difficulty urinating and dysuria.  Musculoskeletal: Negative.  Negative  for back pain and myalgias.  Skin: Negative.  Negative for color change, pallor and rash.  Neurological: Negative.  Negative for dizziness, weakness and light-headedness.  Hematological: Negative for adenopathy. Does not bruise/bleed easily.  Psychiatric/Behavioral: Negative for decreased concentration, dysphoric mood and sleep disturbance. The patient is nervous/anxious.     Objective:  BP 118/74 (BP Location: Left Arm, Patient Position: Sitting, Cuff Size: Normal)   Pulse 72   Temp 97.6 F (36.4 C) (Oral)   Resp 16   Ht 5\' 6"  (1.676 m)   Wt 161 lb (73 kg)   SpO2 98%   BMI 25.99 kg/m   BP Readings from Last 3 Encounters:  04/11/18 118/74  04/09/18 122/70  03/12/18 104/62    Wt Readings from Last 3 Encounters:  04/11/18 161 lb (73 kg)  04/09/18 160 lb 12.8 oz (72.9 kg)  03/12/18 167 lb (75.8 kg)    Physical Exam  Constitutional: She is oriented to person, place, and time.  Non-toxic appearance. She does not have a sickly appearance. She does not appear ill. No distress.  HENT:  Mouth/Throat: Oropharynx is clear and moist. No oropharyngeal exudate.  Eyes: Conjunctivae are normal. No scleral icterus.  Neck: Normal range of motion. Neck supple. No JVD present. No thyromegaly present.  Cardiovascular: Normal rate, regular rhythm and normal heart sounds. Exam reveals no gallop.  No murmur heard. Pulmonary/Chest: Effort normal and breath sounds normal. She has no wheezes. She  has no rhonchi. She has no rales.  Abdominal: Soft. Bowel sounds are normal. She exhibits no mass. There is no hepatosplenomegaly. There is no tenderness.  Musculoskeletal: Normal range of motion. She exhibits no edema, tenderness or deformity.  Lymphadenopathy:    She has no cervical adenopathy.  Neurological: She is alert and oriented to person, place, and time.  Skin: Skin is warm and dry. She is not diaphoretic. No pallor.  Vitals reviewed.   Lab Results  Component Value Date   WBC 10.0  04/09/2018   HGB 13.7 04/09/2018   HCT 40.1 04/09/2018   PLT 205.0 04/09/2018   GLUCOSE 108 (H) 04/09/2018   CHOL 167 12/13/2017   TRIG 265.0 (H) 12/13/2017   HDL 69.20 12/13/2017   LDLDIRECT 60.0 12/13/2017   LDLCALC 152 (H) 02/22/2017   ALT 17 04/09/2018   AST 19 04/09/2018   NA 140 04/09/2018   K 3.5 04/09/2018   CL 103 04/09/2018   CREATININE 1.32 (H) 04/09/2018   BUN 32 (H) 04/09/2018   CO2 25 04/09/2018   TSH 5.62 11/03/2017   INR 0.90 10/14/2009   HGBA1C 6.0 12/13/2017    Dg Chest 2 View  Result Date: 04/09/2018 CLINICAL DATA:  Cough and wheezing EXAM: CHEST - 2 VIEW COMPARISON:  05/30/2011 FINDINGS: The heart size and mediastinal contours are within normal limits. Both lungs are clear. The visualized skeletal structures are unremarkable. IMPRESSION: No active cardiopulmonary disease. Electronically Signed   By: Inez Catalina M.D.   On: 04/09/2018 11:57    Assessment & Plan:   Jamie Villa was seen today for cough.  Diagnoses and all orders for this visit:  Essential hypertension- Her BP is well controlled.  Will continue the current regimen. -     spironolactone (ALDACTONE) 25 MG tablet; Take 1 tablet (25 mg total) by mouth daily. -     olmesartan (BENICAR) 20 MG tablet; Take 1 tablet (20 mg total) by mouth daily.  Viral URI with cough- Based on her symptoms, exam, recent chest x-rays, and normal lab work I think her symptoms are consistent with a viral URI.  She we will go ahead and complete the course of Zithromax.  I have also offered her Phenergan DM as needed for the cough and other symptoms. -     promethazine-dextromethorphan (PROMETHAZINE-DM) 6.25-15 MG/5ML syrup; Take 5 mLs by mouth 4 (four) times daily as needed for up to 7 days for cough.  Diuretic-induced hypokalemia-her recent potassium level was barely normal at 3.5.  I have asked her to continue taking spironolactone. -     spironolactone (ALDACTONE) 25 MG tablet; Take 1 tablet (25 mg total) by mouth  daily.   I have discontinued Tenna Child. Villa's Vitamin D3, azithromycin, and benzonatate. I am also having her start on promethazine-dextromethorphan. Additionally, I am having her maintain her Cholecalciferol, rosuvastatin, pantoprazole, spironolactone, and olmesartan.  Meds ordered this encounter  Medications  . spironolactone (ALDACTONE) 25 MG tablet    Sig: Take 1 tablet (25 mg total) by mouth daily.    Dispense:  90 tablet    Refill:  1  . olmesartan (BENICAR) 20 MG tablet    Sig: Take 1 tablet (20 mg total) by mouth daily.    Dispense:  90 tablet    Refill:  1  . promethazine-dextromethorphan (PROMETHAZINE-DM) 6.25-15 MG/5ML syrup    Sig: Take 5 mLs by mouth 4 (four) times daily as needed for up to 7 days for cough.    Dispense:  118  mL    Refill:  0     Follow-up: Return if symptoms worsen or fail to improve.  Scarlette Calico, MD

## 2018-05-02 ENCOUNTER — Ambulatory Visit: Payer: Self-pay | Admitting: General Surgery

## 2018-06-04 ENCOUNTER — Other Ambulatory Visit: Payer: Self-pay | Admitting: Internal Medicine

## 2018-06-04 ENCOUNTER — Other Ambulatory Visit: Payer: Self-pay | Admitting: Family

## 2018-06-04 DIAGNOSIS — E785 Hyperlipidemia, unspecified: Secondary | ICD-10-CM

## 2018-06-04 DIAGNOSIS — C8330 Diffuse large B-cell lymphoma, unspecified site: Secondary | ICD-10-CM | POA: Diagnosis not present

## 2018-06-04 DIAGNOSIS — E559 Vitamin D deficiency, unspecified: Secondary | ICD-10-CM

## 2018-06-04 DIAGNOSIS — Z9481 Bone marrow transplant status: Secondary | ICD-10-CM | POA: Diagnosis not present

## 2018-07-04 NOTE — Patient Instructions (Addendum)
Jamie Villa  07/04/2018   Your procedure is scheduled on: 07-12-18    Report to [facility] Main  Entrance     Report to Admitting at 6:00 AM    Call this number if you have problems the morning of surgery [phone]    Remember: NO SOLID FOOD AFTER MIDNIGHT THE NIGHT PRIOR TO SURGERY. NOTHING BY MOUTH EXCEPT CLEAR LIQUIDS UNTIL 3 HOURS PRIOR TO [facility] SURGERY. PLEASE FINISH ENSURE DRINK PER SURGEON ORDER 3 HOURS PRIOR TO SCHEDULED SURGERY TIME WHICH NEEDS TO BE COMPLETED AT 5:00 AM.     CLEAR LIQUID DIET   Foods Allowed                                                                     Foods Excluded  Coffee and tea, regular and decaf                             liquids that you cannot  Plain Jell-O in any flavor                                             see through such as: Fruit ices (not with fruit pulp)                                     milk, soups, orange juice  Iced Popsicles                                    All solid food Carbonated beverages, regular and diet                                    Cranberry, grape and apple juices Sports drinks like Gatorade Lightly seasoned clear broth or consume(fat free) Sugar, honey syrup  Sample Menu Breakfast                                Lunch                                     Supper Cranberry juice                    Beef broth                            Chicken broth Jell-O                                     Grape juice  Apple juice Coffee or tea                        Jell-O                                      Popsicle                                                Coffee or tea                        Coffee or tea  _____________________________________________________________________    Take these medicines the morning of surgery with A SIP OF WATER: Rosuvastatin (Crestor)  BRUSH YOUR TEETH MORNING OF SURGERY AND RINSE YOUR MOUTH OUT, NO CHEWING GUM CANDY OR  MINTS.                               You may not have any metal on your body including hair pins and              piercings  Do not wear jewelry, make-up, lotions, powders or perfumes, deodorant             Do not wear nail polish.  Do not shave  48 hours prior to surgery.              Do not bring valuables to the hospital. [place].  Contacts, dentures or bridgework may not be worn into surgery.  Leave suitcase in the car. After surgery it may be brought to your room.    Patients discharged the day of surgery will not be allowed to drive home. IF YOU ARE HAVING SURGERY AND GOING HOME THE SAME DAY, YOU MUST HAVE AN ADULT TO DRIVE YOU HOME AND BE WITH YOU FOR 24 HOURS. YOU MAY GO HOME BY TAXI OR UBER OR ORTHERWISE, BUT AN ADULT MUST ACCOMPANY YOU HOME AND STAY WITH YOU FOR 24 HOURS.    Special Instructions: N/A              Please read over the following fact sheets you were given: _____________________________________________________________________            [facility] - Preparing for Surgery Before surgery, you can play an important role.  Because skin is not sterile, your skin needs to be as free of germs as possible.  You can reduce the number of germs on your skin by washing with CHG (chlorahexidine gluconate) soap before surgery.  CHG is an antiseptic cleaner which kills germs and bonds with the skin to continue killing germs even after washing. Please DO NOT use if you have an allergy to CHG or antibacterial soaps.  If your skin becomes reddened/irritated stop using the CHG and inform your nurse when you arrive at Short Stay. Do not shave (including legs and underarms) for at least 48 hours prior to the first CHG shower.  You may shave your face/neck. Please follow these instructions carefully:  1.  Shower with CHG Soap the night before surgery and the  morning of Surgery.  2.  If you choose to wash your hair, wash your hair first  as usual with your  normal  shampoo.  3.  After you shampoo, rinse your hair and body thoroughly to remove the  shampoo.                           4.  Use CHG as you would any other liquid soap.  You can apply chg directly  to the skin and wash                       Gently with a scrungie or clean washcloth.  5.  Apply the CHG Soap to your body ONLY FROM THE NECK DOWN.   Do not use on face/ open                           Wound or open sores. Avoid contact with eyes, ears mouth and genitals (private parts).                       Wash face,  Genitals (private parts) with your normal soap.             6.  Wash thoroughly, paying special attention to the area where your surgery  will be performed.  7.  Thoroughly rinse your body with warm water from the neck down.  8.  DO NOT shower/wash with your normal soap after using and rinsing off  the CHG Soap.                9.  Pat yourself dry with a clean towel.            10.  Wear clean pajamas.            11.  Place clean sheets on your bed the night of your first shower and do not  sleep with pets. Day of Surgery : Do not apply any lotions/deodorants the morning of surgery.  Please wear clean clothes to the hospital/surgery center.  FAILURE TO FOLLOW THESE INSTRUCTIONS MAY RESULT IN THE CANCELLATION OF YOUR SURGERY PATIENT SIGNATURE_________________________________  NURSE SIGNATURE__________________________________  ________________________________________________________________________

## 2018-07-05 ENCOUNTER — Encounter (HOSPITAL_COMMUNITY)
Admission: RE | Admit: 2018-07-05 | Discharge: 2018-07-05 | Disposition: A | Discharge status: Home / Self Care | Payer: Medicare Other | Source: Ambulatory Visit | Attending: General Surgery | Admitting: General Surgery

## 2018-07-05 ENCOUNTER — Other Ambulatory Visit: Payer: Self-pay

## 2018-07-05 ENCOUNTER — Encounter (HOSPITAL_COMMUNITY): Payer: Self-pay

## 2018-07-05 DIAGNOSIS — I10 Essential (primary) hypertension: Secondary | ICD-10-CM | POA: Insufficient documentation

## 2018-07-05 DIAGNOSIS — I452 Bifascicular block: Secondary | ICD-10-CM | POA: Insufficient documentation

## 2018-07-05 DIAGNOSIS — Z01818 Encounter for other preprocedural examination: Secondary | ICD-10-CM | POA: Diagnosis not present

## 2018-07-05 DIAGNOSIS — I4510 Unspecified right bundle-branch block: Secondary | ICD-10-CM | POA: Insufficient documentation

## 2018-07-05 DIAGNOSIS — R9431 Abnormal electrocardiogram [ECG] [EKG]: Secondary | ICD-10-CM | POA: Insufficient documentation

## 2018-07-05 DIAGNOSIS — I444 Left anterior fascicular block: Secondary | ICD-10-CM | POA: Diagnosis not present

## 2018-07-05 DIAGNOSIS — K851 Biliary acute pancreatitis without necrosis or infection: Secondary | ICD-10-CM | POA: Diagnosis not present

## 2018-07-05 HISTORY — DX: Adverse effect of unspecified anesthetic, initial encounter: T41.45XA

## 2018-07-05 HISTORY — DX: Other specified postprocedural states: Z98.890

## 2018-07-05 HISTORY — DX: Nausea with vomiting, unspecified: R11.2

## 2018-07-05 HISTORY — DX: Other complications of anesthesia, initial encounter: T88.59XA

## 2018-07-05 LAB — BASIC METABOLIC PANEL
Anion gap: 8 (ref 5–15)
BUN: 23 mg/dL (ref 8–23)
CO2: 27 mmol/L (ref 22–32)
Calcium: 9.9 mg/dL (ref 8.9–10.3)
Chloride: 107 mmol/L (ref 98–111)
Creatinine, Ser: 0.98 mg/dL (ref 0.44–1.00)
GFR calc Af Amer: >60 mL/min (ref >60)
GFR calc non Af Amer: 57 mL/min — ABNORMAL LOW (ref >60)
Glucose, Bld: 103 mg/dL — ABNORMAL HIGH (ref 70–99)
Potassium: 4.1 mmol/L (ref 3.5–5.1)
Sodium: 142 mmol/L (ref 135–145)

## 2018-07-05 LAB — CBC
HCT: 41.4 % (ref 36.0–46.0)
Hemoglobin: 13.3 g/dL (ref 12.0–15.0)
MCH: 32.5 pg (ref 26.0–34.0)
MCHC: 32.1 g/dL (ref 30.0–36.0)
MCV: 101.2 fL — ABNORMAL HIGH (ref 80.0–100.0)
Platelets: 175 10*3/uL (ref 150–400)
RBC: 4.09 MIL/uL (ref 3.87–5.11)
RDW: 12.5 % (ref 11.5–15.5)
WBC: 6.6 10*3/uL (ref 4.0–10.5)
nRBC: 0 % (ref 0.0–0.2)

## 2018-07-06 NOTE — Progress Notes (Signed)
07-05-18 EKG results reviewed with Janett Billow, [place]. No new orders at this time.

## 2018-07-11 NOTE — Anesthesia Preprocedure Evaluation (Addendum)
Anesthesia Evaluation  Patient identified by MRN, date of birth, ID band Patient awake    Reviewed: Allergy & Precautions, NPO status , Patient's Chart, lab work & pertinent test results  History of Anesthesia Complications (+) PONV  Airway Mallampati: II  TM Distance: >3 FB Neck ROM: Full    Dental no notable dental hx. (+) Teeth Intact, Dental Advisory Given   Pulmonary    Pulmonary exam normal breath sounds clear to auscultation       Cardiovascular hypertension, Pt. 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   Neuro/Psych negative neurological ROS     GI/Hepatic Neg liver ROS, hiatal hernia,   Endo/Other  Hypothyroidism   Renal/GU negative Renal ROS  negative genitourinary   Musculoskeletal   Abdominal   Peds  Hematology   Anesthesia Other Findings   Reproductive/Obstetrics                            Lab Results  Component Value Date   WBC 6.6 07/05/2018   HGB 13.3 07/05/2018   HCT 41.4 07/05/2018   MCV 101.2 (H) 07/05/2018   PLT 175 07/05/2018    Anesthesia Physical Anesthesia Plan  ASA: II  Anesthesia Plan: General   Post-op Pain Management:    Induction: Intravenous  PONV Risk Score and Plan: 4 or greater and Treatment may vary due to age or medical condition, Dexamethasone, Ondansetron and Midazolam  Airway Management Planned: Oral ETT  Additional Equipment:   Intra-op Plan:   Post-operative Plan: Extubation in OR  Informed Consent: I have reviewed the patients History and Physical, chart, labs and discussed the procedure including the risks, benefits and alternatives for the proposed anesthesia with the patient or authorized representative who has indicated his/her understanding and acceptance.     Dental advisory given  Plan Discussed with: CRNA  Anesthesia Plan Comments:          Anesthesia Quick Evaluation

## 2018-07-12 ENCOUNTER — Ambulatory Visit (HOSPITAL_COMMUNITY): Payer: Medicare Other

## 2018-07-12 ENCOUNTER — Ambulatory Visit (HOSPITAL_COMMUNITY)
Admission: AC | Admit: 2018-07-12 | Discharge: 2018-07-13 | Disposition: A | Discharge status: Home / Self Care | Payer: Medicare Other | Attending: General Surgery | Admitting: General Surgery

## 2018-07-12 ENCOUNTER — Encounter (HOSPITAL_COMMUNITY)
Admission: AC | Disposition: A | Discharge status: Home / Self Care | Payer: Self-pay | Source: Home / Self Care | Attending: General Surgery

## 2018-07-12 ENCOUNTER — Encounter (HOSPITAL_COMMUNITY): Payer: Self-pay | Admitting: *Deleted

## 2018-07-12 ENCOUNTER — Other Ambulatory Visit: Payer: Self-pay

## 2018-07-12 ENCOUNTER — Ambulatory Visit (HOSPITAL_COMMUNITY): Payer: Medicare Other | Admitting: Anesthesiology

## 2018-07-12 ENCOUNTER — Ambulatory Visit (HOSPITAL_COMMUNITY): Payer: Medicare Other | Admitting: Physician Assistant

## 2018-07-12 DIAGNOSIS — Z79899 Other long term (current) drug therapy: Secondary | ICD-10-CM | POA: Diagnosis not present

## 2018-07-12 DIAGNOSIS — Z9484 Stem cells transplant status: Secondary | ICD-10-CM | POA: Insufficient documentation

## 2018-07-12 DIAGNOSIS — K8020 Calculus of gallbladder without cholecystitis without obstruction: Secondary | ICD-10-CM | POA: Diagnosis not present

## 2018-07-12 DIAGNOSIS — I10 Essential (primary) hypertension: Secondary | ICD-10-CM | POA: Insufficient documentation

## 2018-07-12 DIAGNOSIS — I4510 Unspecified right bundle-branch block: Secondary | ICD-10-CM | POA: Diagnosis not present

## 2018-07-12 DIAGNOSIS — E785 Hyperlipidemia, unspecified: Secondary | ICD-10-CM | POA: Diagnosis not present

## 2018-07-12 DIAGNOSIS — Z8572 Personal history of non-Hodgkin lymphomas: Secondary | ICD-10-CM | POA: Insufficient documentation

## 2018-07-12 DIAGNOSIS — E039 Hypothyroidism, unspecified: Secondary | ICD-10-CM | POA: Diagnosis not present

## 2018-07-12 DIAGNOSIS — K449 Diaphragmatic hernia without obstruction or gangrene: Secondary | ICD-10-CM | POA: Diagnosis not present

## 2018-07-12 DIAGNOSIS — K8010 Calculus of gallbladder with chronic cholecystitis without obstruction: Secondary | ICD-10-CM | POA: Insufficient documentation

## 2018-07-12 HISTORY — PX: CHOLECYSTECTOMY: SHX55

## 2018-07-12 SURGERY — LAPAROSCOPIC CHOLECYSTECTOMY WITH INTRAOPERATIVE CHOLANGIOGRAM
Anesthesia: General | Site: Abdomen

## 2018-07-12 MED ORDER — BUPIVACAINE-EPINEPHRINE 0.25% -1:200000 IJ SOLN
INTRAMUSCULAR | Status: DC | PRN
  Administered 2018-07-12: 20 mL

## 2018-07-12 MED ORDER — ROCURONIUM BROMIDE 10 MG/ML (PF) SYRINGE
PREFILLED_SYRINGE | INTRAVENOUS | Status: DC | PRN
  Administered 2018-07-12: 40 mg via INTRAVENOUS

## 2018-07-12 MED ORDER — IRBESARTAN 75 MG PO TABS
37.5000 mg | ORAL_TABLET | Freq: Every day | ORAL | Status: DC
  Administered 2018-07-12 – 2018-07-13 (×2): 37.5 mg via ORAL
  Filled 2018-07-12 (×2): qty 1

## 2018-07-12 MED ORDER — ROCURONIUM BROMIDE 100 MG/10ML IV SOLN
INTRAVENOUS | Status: AC
  Filled 2018-07-12: qty 1

## 2018-07-12 MED ORDER — PROPOFOL 10 MG/ML IV BOLUS
INTRAVENOUS | Status: DC | PRN
  Administered 2018-07-12: 110 mg via INTRAVENOUS

## 2018-07-12 MED ORDER — FENTANYL CITRATE (PF) 100 MCG/2ML IJ SOLN: 25.0000 ug | INTRAMUSCULAR | Status: DC | PRN

## 2018-07-12 MED ORDER — ONDANSETRON HCL 4 MG/2ML IJ SOLN
INTRAMUSCULAR | Status: DC | PRN
  Administered 2018-07-12: 4 mg via INTRAVENOUS

## 2018-07-12 MED ORDER — PHENYLEPHRINE 40 MCG/ML (10ML) SYRINGE FOR IV PUSH (FOR BLOOD PRESSURE SUPPORT)
PREFILLED_SYRINGE | INTRAVENOUS | Status: DC | PRN
  Administered 2018-07-12: 80 ug via INTRAVENOUS
  Administered 2018-07-12: 120 ug via INTRAVENOUS
  Administered 2018-07-12: 80 ug via INTRAVENOUS
  Administered 2018-07-12: 40 ug via INTRAVENOUS

## 2018-07-12 MED ORDER — ONDANSETRON 4 MG PO TBDP: 4.0000 mg | ORAL_TABLET | Freq: Four times a day (QID) | ORAL | Status: DC | PRN

## 2018-07-12 MED ORDER — KETOROLAC TROMETHAMINE 15 MG/ML IJ SOLN: 15.0000 mg | Freq: Once | INTRAMUSCULAR | Status: DC | PRN

## 2018-07-12 MED ORDER — KCL IN DEXTROSE-NACL 20-5-0.9 MEQ/L-%-% IV SOLN
INTRAVENOUS | Status: DC
  Administered 2018-07-12: 11:00:00 via INTRAVENOUS
  Filled 2018-07-12: qty 1000

## 2018-07-12 MED ORDER — DEXAMETHASONE SODIUM PHOSPHATE 10 MG/ML IJ SOLN
INTRAMUSCULAR | Status: AC
  Filled 2018-07-12: qty 1

## 2018-07-12 MED ORDER — CIPROFLOXACIN IN D5W 400 MG/200ML IV SOLN
400.0000 mg | INTRAVENOUS | Status: AC
  Administered 2018-07-12: 400 mg via INTRAVENOUS
  Filled 2018-07-12: qty 200

## 2018-07-12 MED ORDER — ACETAMINOPHEN 500 MG PO TABS
1000.0000 mg | ORAL_TABLET | ORAL | Status: AC
  Administered 2018-07-12: 1000 mg via ORAL
  Filled 2018-07-12: qty 2

## 2018-07-12 MED ORDER — LIDOCAINE 2% (20 MG/ML) 5 ML SYRINGE
INTRAMUSCULAR | Status: DC | PRN
  Administered 2018-07-12: 1.5 mg/kg/h via INTRAVENOUS

## 2018-07-12 MED ORDER — PROPOFOL 10 MG/ML IV BOLUS
INTRAVENOUS | Status: AC
  Filled 2018-07-12: qty 20

## 2018-07-12 MED ORDER — CELECOXIB 200 MG PO CAPS
200.0000 mg | ORAL_CAPSULE | ORAL | Status: AC
  Administered 2018-07-12: 200 mg via ORAL
  Filled 2018-07-12: qty 1

## 2018-07-12 MED ORDER — 0.9 % SODIUM CHLORIDE (POUR BTL) OPTIME
TOPICAL | Status: DC | PRN
  Administered 2018-07-12: 1000 mL

## 2018-07-12 MED ORDER — HYDROCODONE-ACETAMINOPHEN 5-325 MG PO TABS: 1.0000 | ORAL_TABLET | ORAL | Status: DC | PRN

## 2018-07-12 MED ORDER — ONDANSETRON HCL 4 MG/2ML IJ SOLN: 4.0000 mg | Freq: Once | INTRAMUSCULAR | Status: DC | PRN

## 2018-07-12 MED ORDER — IOPAMIDOL (ISOVUE-300) INJECTION 61%
INTRAVENOUS | Status: AC
  Filled 2018-07-12: qty 50

## 2018-07-12 MED ORDER — ONDANSETRON HCL 4 MG/2ML IJ SOLN
INTRAMUSCULAR | Status: AC
  Filled 2018-07-12: qty 2

## 2018-07-12 MED ORDER — FENTANYL CITRATE (PF) 250 MCG/5ML IJ SOLN
INTRAMUSCULAR | Status: AC
  Filled 2018-07-12: qty 5

## 2018-07-12 MED ORDER — HEPARIN SODIUM (PORCINE) 5000 UNIT/ML IJ SOLN
5000.0000 [IU] | Freq: Three times a day (TID) | INTRAMUSCULAR | Status: DC
  Administered 2018-07-13: 5000 [IU] via SUBCUTANEOUS
  Filled 2018-07-12: qty 1

## 2018-07-12 MED ORDER — MIDAZOLAM HCL 2 MG/2ML IJ SOLN
INTRAMUSCULAR | Status: DC | PRN
  Administered 2018-07-12: 0.5 mg via INTRAVENOUS

## 2018-07-12 MED ORDER — CHLORHEXIDINE GLUCONATE CLOTH 2 % EX PADS: 6.0000 | MEDICATED_PAD | Freq: Once | CUTANEOUS | Status: DC

## 2018-07-12 MED ORDER — LIDOCAINE 2% (20 MG/ML) 5 ML SYRINGE
INTRAMUSCULAR | Status: AC
  Filled 2018-07-12: qty 5

## 2018-07-12 MED ORDER — SPIRONOLACTONE 25 MG PO TABS
25.0000 mg | ORAL_TABLET | Freq: Every day | ORAL | Status: DC
  Administered 2018-07-12 – 2018-07-13 (×2): 25 mg via ORAL
  Filled 2018-07-12 (×2): qty 1

## 2018-07-12 MED ORDER — LACTATED RINGERS IR SOLN
Status: DC | PRN
  Administered 2018-07-12: 1000 mL

## 2018-07-12 MED ORDER — TRAMADOL HCL 50 MG PO TABS
50.0000 mg | ORAL_TABLET | Freq: Four times a day (QID) | ORAL | Status: DC | PRN
  Administered 2018-07-12 – 2018-07-13 (×2): 100 mg via ORAL
  Filled 2018-07-12: qty 2
  Filled 2018-07-12 (×2): qty 1

## 2018-07-12 MED ORDER — SUGAMMADEX SODIUM 200 MG/2ML IV SOLN
INTRAVENOUS | Status: AC
  Filled 2018-07-12: qty 2

## 2018-07-12 MED ORDER — LACTATED RINGERS IV SOLN
INTRAVENOUS | Status: DC
  Administered 2018-07-12: 07:00:00 via INTRAVENOUS

## 2018-07-12 MED ORDER — MIDAZOLAM HCL 2 MG/2ML IJ SOLN
INTRAMUSCULAR | Status: AC
  Filled 2018-07-12: qty 2

## 2018-07-12 MED ORDER — DEXAMETHASONE SODIUM PHOSPHATE 10 MG/ML IJ SOLN
INTRAMUSCULAR | Status: DC | PRN
  Administered 2018-07-12: 7 mg via INTRAVENOUS

## 2018-07-12 MED ORDER — LIDOCAINE 2% (20 MG/ML) 5 ML SYRINGE
INTRAMUSCULAR | Status: DC | PRN
  Administered 2018-07-12: 100 mg via INTRAVENOUS

## 2018-07-12 MED ORDER — FENTANYL CITRATE (PF) 250 MCG/5ML IJ SOLN
INTRAMUSCULAR | Status: DC | PRN
  Administered 2018-07-12: 25 ug via INTRAVENOUS
  Administered 2018-07-12: 50 ug via INTRAVENOUS
  Administered 2018-07-12 (×2): 25 ug via INTRAVENOUS
  Administered 2018-07-12 (×2): 50 ug via INTRAVENOUS
  Administered 2018-07-12: 25 ug via INTRAVENOUS

## 2018-07-12 MED ORDER — PHENYLEPHRINE 40 MCG/ML (10ML) SYRINGE FOR IV PUSH (FOR BLOOD PRESSURE SUPPORT)
PREFILLED_SYRINGE | INTRAVENOUS | Status: AC
  Filled 2018-07-12: qty 10

## 2018-07-12 MED ORDER — ONDANSETRON HCL 4 MG/2ML IJ SOLN
4.0000 mg | Freq: Four times a day (QID) | INTRAMUSCULAR | Status: DC | PRN
  Administered 2018-07-12: 4 mg via INTRAVENOUS
  Filled 2018-07-12: qty 2

## 2018-07-12 MED ORDER — LIDOCAINE 2% (20 MG/ML) 5 ML SYRINGE
INTRAMUSCULAR | Status: AC
  Filled 2018-07-12: qty 10

## 2018-07-12 MED ORDER — ROSUVASTATIN CALCIUM 10 MG PO TABS
10.0000 mg | ORAL_TABLET | Freq: Every day | ORAL | Status: DC
  Administered 2018-07-13: 10 mg via ORAL
  Filled 2018-07-12: qty 1

## 2018-07-12 MED ORDER — BUPIVACAINE-EPINEPHRINE (PF) 0.25% -1:200000 IJ SOLN
INTRAMUSCULAR | Status: AC
  Filled 2018-07-12: qty 30

## 2018-07-12 MED ORDER — IOPAMIDOL (ISOVUE-300) INJECTION 61%
INTRAVENOUS | Status: DC | PRN
  Administered 2018-07-12: 5 mL

## 2018-07-12 MED ORDER — PANTOPRAZOLE SODIUM 40 MG IV SOLR
40.0000 mg | Freq: Every day | INTRAVENOUS | Status: DC
  Administered 2018-07-12: 40 mg via INTRAVENOUS
  Filled 2018-07-12: qty 40

## 2018-07-12 MED ORDER — SUGAMMADEX SODIUM 200 MG/2ML IV SOLN
INTRAVENOUS | Status: DC | PRN
  Administered 2018-07-12: 200 mg via INTRAVENOUS

## 2018-07-12 MED ORDER — GABAPENTIN 300 MG PO CAPS
300.0000 mg | ORAL_CAPSULE | ORAL | Status: AC
  Administered 2018-07-12: 300 mg via ORAL
  Filled 2018-07-12: qty 1

## 2018-07-12 SURGICAL SUPPLY — 31 items
ADH SKN CLS APL DERMABOND .7 (GAUZE/BANDAGES/DRESSINGS) ×1
APPLIER CLIP 5 13 M/L LIGAMAX5 (MISCELLANEOUS) ×3
APR CLP MED LRG 5 ANG JAW (MISCELLANEOUS) ×1
BAG SPEC RTRVL LRG 6X4 10 (ENDOMECHANICALS) ×1
CABLE HIGH FREQUENCY MONO STRZ (ELECTRODE) ×3 IMPLANT
CATH REDDICK CHOLANGI 4FR 50CM (CATHETERS) ×3 IMPLANT
CHLORAPREP W/TINT 26ML (MISCELLANEOUS) ×3 IMPLANT
CLIP APPLIE 5 13 M/L LIGAMAX5 (MISCELLANEOUS) ×1 IMPLANT
COVER MAYO STAND STRL (DRAPES) ×3 IMPLANT
COVER WAND RF STERILE (DRAPES) IMPLANT
DECANTER SPIKE VIAL GLASS SM (MISCELLANEOUS) ×3 IMPLANT
DERMABOND ADVANCED (GAUZE/BANDAGES/DRESSINGS) ×2
DERMABOND ADVANCED .7 DNX12 (GAUZE/BANDAGES/DRESSINGS) ×1 IMPLANT
DRAPE C-ARM 42X120 X-RAY (DRAPES) ×3 IMPLANT
ELECT REM PT RETURN 15FT ADLT (MISCELLANEOUS) ×3 IMPLANT
GLOVE BIO SURGEON STRL SZ7.5 (GLOVE) ×3 IMPLANT
GOWN STRL REUS W/TWL XL LVL3 (GOWN DISPOSABLE) ×9 IMPLANT
HEMOSTAT SURGICEL 4X8 (HEMOSTASIS) IMPLANT
IV CATH 14GX2 1/4 (CATHETERS) ×3 IMPLANT
KIT BASIN OR (CUSTOM PROCEDURE TRAY) ×3 IMPLANT
POUCH SPECIMEN RETRIEVAL 10MM (ENDOMECHANICALS) ×3 IMPLANT
SCISSORS LAP 5X35 DISP (ENDOMECHANICALS) ×3 IMPLANT
SET IRRIG TUBING LAPAROSCOPIC (IRRIGATION / IRRIGATOR) ×3 IMPLANT
SET TUBE SMOKE EVAC HIGH FLOW (TUBING) ×3 IMPLANT
SLEEVE XCEL OPT CAN 5 100 (ENDOMECHANICALS) ×6 IMPLANT
SUT MNCRL AB 4-0 PS2 18 (SUTURE) ×3 IMPLANT
TOWEL OR 17X26 10 PK STRL BLUE (TOWEL DISPOSABLE) ×3 IMPLANT
TOWEL OR NON WOVEN STRL DISP B (DISPOSABLE) ×3 IMPLANT
TRAY LAPAROSCOPIC (CUSTOM PROCEDURE TRAY) ×3 IMPLANT
TROCAR BLADELESS OPT 5 100 (ENDOMECHANICALS) ×3 IMPLANT
TROCAR XCEL BLUNT TIP 100MML (ENDOMECHANICALS) ×3 IMPLANT

## 2018-07-12 NOTE — Op Note (Signed)
07/12/2018  9:29 AM  PATIENT:  Jamie Villa  73 y.o. female  PRE-OPERATIVE DIAGNOSIS:  gallstones  POST-OPERATIVE DIAGNOSIS:  gallstones  PROCEDURE:  Procedure(s): LAPAROSCOPIC CHOLECYSTECTOMY WITH INTRAOPERATIVE CHOLANGIOGRAM ERAS PATHWAY (N/A)  SURGEON:  Surgeon(s) and Role:    * Jovita Kussmaul, MD - Primary  PHYSICIAN ASSISTANT:   ASSISTANTS: none   ANESTHESIA:   local and general  EBL:  20 mL   BLOOD ADMINISTERED:none  DRAINS: none   LOCAL MEDICATIONS USED:  MARCAINE     SPECIMEN:  Source of Specimen:  gallbladder  DISPOSITION OF SPECIMEN:  PATHOLOGY  COUNTS:  YES  TOURNIQUET:  * No tourniquets in log *  DICTATION: .Dragon Dictation     Procedure: After informed consent was obtained the patient was brought to the operating room and placed in the supine position on the operating room table. After adequate induction of general anesthesia the patient's abdomen was prepped with ChloraPrep allowed to dry and draped in usual sterile manner. An appropriate timeout was performed. The area below the umbilicus was infiltrated with quarter percent  Marcaine. A small incision was made with a 15 blade knife. The incision was carried down through the subcutaneous tissue bluntly with a hemostat and [employer]-Nav[employer] retractors. The linea alba was identified. The linea alba was incised with a 15 blade knife and each side was grasped with Coker clamps. The preperitoneal space was then probed with a hemostat until the peritoneum was opened and access was gained to the abdominal cavity. A 0 Vicryl pursestring stitch was placed in the fascia surrounding the opening. A Hassan cannula was then placed through the opening and anchored in place with the previously placed Vicryl purse string stitch. The abdomen was insufflated with carbon dioxide without difficulty. A laparoscope was inserted through the [facility] cannula in the right upper quadrant was inspected. Next the epigastric region was  infiltrated with % Marcaine. A small incision was made with a 15 blade knife. A 5 mm port was placed bluntly through this incision into the abdominal cavity under direct vision. Next 2 sites were chosen laterally on the right side of the abdomen for placement of 5 mm ports. Each of these areas was infiltrated with quarter percent Marcaine. Small stab incisions were made with a 15 blade knife. 5 mm ports were then placed bluntly through these incisions into the abdominal cavity under direct vision without difficulty. A blunt grasper was placed through the lateralmost 5 mm port and used to grasp the dome of the gallbladder and elevated anteriorly and superiorly. Another blunt grasper was placed through the other 5 mm port and used to retract the body and neck of the gallbladder. A dissector was placed through the epigastric port and using the electrocautery the peritoneal reflection at the gallbladder neck was opened. Blunt dissection was then carried out in this area until the gallbladder neck-cystic duct junction was readily identified and a good window was created. A single clip was placed on the gallbladder neck. A small  ductotomy was made just below the clip with laparoscopic scissors. A 14-gauge Angiocath was then placed through the anterior abdominal wall under direct vision. A Reddick cholangiogram catheter was then placed through the Angiocath and flushed. The catheter was then placed in the cystic duct and anchored in place with a clip. A cholangiogram was obtained that showed no filling defects good emptying into the duodenum an adequate length on the cystic duct. The anchoring clip and catheters were then removed from the patient.  3 clips were placed proximally on the cystic duct and the duct was divided between the 2 sets of clips. Posterior to this the cystic artery was identified and again dissected bluntly in a circumferential manner until a good window  was created. 2 clips were placed proximally  and one distally on the artery and the artery was divided between the 2 sets of clips. Next a laparoscopic hook cautery device was used to separate the gallbladder from the liver bed. Prior to completely detaching the gallbladder from the liver bed the liver bed was inspected and several small bleeding points were coagulated with the electrocautery until the area was completely hemostatic. The gallbladder was then detached the rest of it from the liver bed without difficulty. A laparoscopic bag was inserted through the hassan port. The laparoscope was moved to the epigastric port. The gallbladder was placed within the bag and the bag was sealed.  The bag with the gallbladder was then removed with the [facility] cannula through the infraumbilical port without difficulty. The fascial defect was then closed with the previously placed Vicryl pursestring stitch as well as with another figure-of-eight 0 Vicryl stitch. The liver bed was inspected again and found to be hemostatic. The abdomen was irrigated with copious amounts of saline until the effluent was clear. The ports were then removed under direct vision without difficulty and were found to be hemostatic. The gas was allowed to escape. The skin incisions were all closed with interrupted 4-0 Monocryl subcuticular stitches. Dermabond dressings were applied. The patient tolerated the procedure well. At the end of the case all needle sponge and instrument counts were correct. The patient was then awakened and taken to recovery in stable condition  PLAN OF CARE: Admit for overnight observation  PATIENT DISPOSITION:  PACU - hemodynamically stable.   Delay start of Pharmacological VTE agent (>24hrs) due to surgical blood loss or risk of bleeding: no

## 2018-07-12 NOTE — Anesthesia Procedure Notes (Signed)
Procedure Name: Intubation Date/Time: 07/12/2018 8:15 AM Performed by: Cynda Familia, CRNA Pre-anesthesia Checklist: Patient identified, Emergency Drugs available, Suction available and Patient being monitored Patient Re-evaluated:Patient Re-evaluated prior to induction Oxygen Delivery Method: Circle System Utilized Preoxygenation: Pre-oxygenation with 100% oxygen Induction Type: IV induction Ventilation: Mask ventilation without difficulty Laryngoscope Size: Miller and 2 Grade View: Grade I Tube type: Oral Tube size: 7.0 mm Number of attempts: 1 Airway Equipment and Method: Stylet Placement Confirmation: ETT inserted through vocal cords under direct vision,  positive ETCO2 and breath sounds checked- equal and bilateral Secured at: 22 cm Tube secured with: Tape Dental Injury: Teeth and Oropharynx as per pre-operative assessment  Comments: Smooth IV induction Houser-- intubation AM CRNA atraumatic-- small chip present on front tooth prior to laryngoscopy-- pt did not report on preop interview-- dental advisory given in preop by CRNA-- bilat BS Houser

## 2018-07-12 NOTE — Transfer of Care (Signed)
Immediate Anesthesia Transfer of Care Note  Patient: Jamie Villa  Procedure(s) Performed: LAPAROSCOPIC CHOLECYSTECTOMY WITH INTRAOPERATIVE CHOLANGIOGRAM ERAS PATHWAY (N/A Abdomen)  Patient Location: PACU  Anesthesia Type:General  Level of Consciousness: sedated  Airway & Oxygen Therapy: Patient Spontanous Breathing and Patient connected to face mask oxygen  Post-op Assessment: Report given to RN and Post -op Vital signs reviewed and stable  Post vital signs: Reviewed and stable  Last Vitals:  Vitals Value Taken Time  BP 139/79 07/12/2018  9:42 AM  Temp    Pulse 69 07/12/2018  9:45 AM  Resp 14 07/12/2018  9:45 AM  SpO2 100 % 07/12/2018  9:45 AM  Vitals shown include unvalidated device data.  Last Pain:  Vitals:   07/12/18 0654  TempSrc: Oral         Complications: GA- Neo after induction, B/P stable in PACU

## 2018-07-12 NOTE — Anesthesia Procedure Notes (Signed)
Date/Time: 07/12/2018 9:37 AM Performed by: Cynda Familia, CRNA Oxygen Delivery Method: Simple face mask Placement Confirmation: breath sounds checked- equal and bilateral and positive ETCO2 Dental Injury: Teeth and Oropharynx as per pre-operative assessment

## 2018-07-12 NOTE — Anesthesia Postprocedure Evaluation (Signed)
Anesthesia Post Note  Patient: Jamie Villa  Procedure(s) Performed: LAPAROSCOPIC CHOLECYSTECTOMY WITH INTRAOPERATIVE CHOLANGIOGRAM ERAS PATHWAY (N/A Abdomen)     Patient location during evaluation: PACU Anesthesia Type: General Level of consciousness: awake and alert Pain management: pain level controlled Vital Signs Assessment: post-procedure vital signs reviewed and stable Respiratory status: spontaneous breathing, nonlabored ventilation, respiratory function stable and patient connected to nasal cannula oxygen Cardiovascular status: blood pressure returned to baseline and stable Postop Assessment: no apparent nausea or vomiting Anesthetic complications: no    Last Vitals:  Vitals:   07/12/18 1144 07/12/18 1240  BP: 134/72 137/68  Pulse: 76 78  Resp: 15 15  Temp: 36.6 C 36.7 C  SpO2: 99% 100%    Last Pain:  Vitals:   07/12/18 1441  TempSrc:   PainSc: 3                  Barnet Glasgow

## 2018-07-12 NOTE — Interval H&P Note (Signed)
History and Physical Interval Note:  07/12/2018 7:57 AM  Jamie Villa  has presented today for surgery, with the diagnosis of gallstones  The various methods of treatment have been discussed with the patient and family. After consideration of risks, benefits and other options for treatment, the patient has consented to  Procedure(s): LAPAROSCOPIC CHOLECYSTECTOMY WITH INTRAOPERATIVE CHOLANGIOGRAM ERAS PATHWAY (N/A) as a surgical intervention .  The patient's history has been reviewed, patient examined, no change in status, stable for surgery.  I have reviewed the patient's chart and labs.  Questions were answered to the patient's satisfaction.     Autumn Messing III

## 2018-07-12 NOTE — H&P (Signed)
Jamie Villa  Location: [facility] Patient #: [id number] DOB: December 18, 1945 Widowed / Language: Jamie Villa / Race: White Female   History of Present Illness  The patient is a 73 year old female who presents with abdominal pain. We are asked to see the patient in consultation by Dr. Jodi Mourning to evaluate her for gallstones. The patient is a 73 year old white female who presents with right upper quadrant pain that started about 3 weeks ago. The pain was associated with nausea but no vomiting. She does have some new pain in her mid back. She underwent an ultrasound that did show stones in the gallbladder but no gallbladder wall thickening or ductal dilatation. Her liver functions were normal. 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 She is followed at [facility] periodically with staging scans the most recent work earlier this year   Allergies  No Known Drug Allergies  Allergies Reconciled   Medication History  Olmesartan Medoxomil (20MG  Tablet, Oral) Active. Vitamin D3 (50000UNIT Capsule, Oral) Active. Rosuvastatin Calcium (10MG  Tablet, Oral) Active. Spironolactone (25MG  Tablet, Oral) Active. Medications Reconciled  Social History  No drug use   Family History  Family history unknown  First Degree Relatives     Review of Systems Respiratory Not Present- Bloody sputum, Chronic Cough, Difficulty Breathing, Snoring and Wheezing. Breast Not Present- Breast Mass, Breast Pain, Nipple Discharge and Skin Changes. Cardiovascular Not Present- Chest Pain, Difficulty Breathing Lying Down, Leg Cramps, Palpitations, Rapid Heart Rate, Shortness of Breath and Swelling of Extremities. Musculoskeletal Present- Back Pain. Not Present- Joint Pain, Joint Stiffness, Muscle Pain, Muscle Weakness and Swelling of Extremities. Neurological Not Present- Decreased Memory, Fainting, Headaches, Numbness, Seizures,  Tingling, Tremor, Trouble walking and Weakness. Psychiatric Not Present- Anxiety, Bipolar, Change in Sleep Pattern, Depression, Fearful and Frequent crying.  Vitals  Weight: 164.5 lb Height: 67in Body Surface Area: 1.86 m Body Mass Index: 25.76 kg/m  Temp.: 98.14F(Oral)  Pulse: 134 (Regular)  BP: 130/80 (Sitting, Left Arm, Standard)       Physical Exam General Mental Status-Alert. General Appearance-Consistent with stated age. Hydration-Well hydrated. Voice-Normal.  Head and Neck Head-normocephalic, atraumatic with no lesions or palpable masses. Trachea-midline. Thyroid Gland Characteristics - normal size and consistency.  Eye Eyeball - Bilateral-Extraocular movements intact. Sclera/Conjunctiva - Bilateral-No scleral icterus.  Chest and Lung Exam Chest and lung exam reveals -quiet, even and easy respiratory effort with no use of accessory muscles and on auscultation, normal breath sounds, no adventitious sounds and normal vocal resonance. Inspection Chest Wall - Normal. Back - normal.  Cardiovascular Cardiovascular examination reveals -normal heart sounds, regular rate and rhythm with no murmurs and normal pedal pulses bilaterally.  Abdomen Note: The abdomen is soft and nontender. She has surgical scars from a tummy tuck. There is no palpable mass.   Neurologic Neurologic evaluation reveals -alert and oriented x 3 with no impairment of recent or remote memory. Mental Status-Normal.  Musculoskeletal Normal Exam - Left-Upper Extremity Strength Normal and Lower Extremity Strength Normal. Normal Exam - Right-Upper Extremity Strength Normal and Lower Extremity Strength Normal.  Lymphatic Head & Neck  General Head & Neck Lymphatics: Bilateral - Description - Normal. Axillary  General Axillary Region: Bilateral - Description - Normal. Tenderness - Non Tender. Femoral & Inguinal  Generalized Femoral & Inguinal Lymphatics:  Bilateral - Description - Normal. Tenderness - Non Tender.    Assessment & Plan  GALLSTONES (K80.20) Impression: The patient appears to have symptomatic gallstones.  Because of the risk of further painful episodes and possible pancreatitis I think she would benefit from having her gallbladder removed. I have discussed with her in detail the risks and benefits of the operation as well as some of the technical aspects and she understands. She would like to discuss it with her family before making her final decision. I believe she has some concerns given her history of lymphoma but she did have staging scans earlier this year at [facility] that were unremarkable.

## 2018-07-13 ENCOUNTER — Encounter (HOSPITAL_COMMUNITY): Payer: Self-pay | Admitting: General Surgery

## 2018-07-13 DIAGNOSIS — K8010 Calculus of gallbladder with chronic cholecystitis without obstruction: Secondary | ICD-10-CM | POA: Diagnosis not present

## 2018-07-13 DIAGNOSIS — I10 Essential (primary) hypertension: Secondary | ICD-10-CM | POA: Diagnosis not present

## 2018-07-13 DIAGNOSIS — E039 Hypothyroidism, unspecified: Secondary | ICD-10-CM | POA: Diagnosis not present

## 2018-07-13 DIAGNOSIS — K449 Diaphragmatic hernia without obstruction or gangrene: Secondary | ICD-10-CM | POA: Diagnosis not present

## 2018-07-13 DIAGNOSIS — I4510 Unspecified right bundle-branch block: Secondary | ICD-10-CM | POA: Diagnosis not present

## 2018-07-13 DIAGNOSIS — Z79899 Other long term (current) drug therapy: Secondary | ICD-10-CM | POA: Diagnosis not present

## 2018-07-13 MED ORDER — TRAMADOL HCL 50 MG PO TABS: 50.0000 mg | ORAL_TABLET | Freq: Four times a day (QID) | ORAL | 0 refills | Status: DC | PRN

## 2018-07-13 NOTE — Progress Notes (Signed)
1 Day Post-Op   Subjective/Chief Complaint: No complaints other than sorness   Objective: Vital signs in last 24 hours: Temp:  [97.5 F (36.4 C)-98 F (36.7 C)] 97.9 F (36.6 C) (01/24 0556) Pulse Rate:  [69-91] 81 (01/24 0556) Resp:  [12-16] 16 (01/24 0556) BP: (106-139)/(68-80) 106/78 (01/24 0556) SpO2:  [94 %-100 %] 95 % (01/24 0556) Weight:  [63.5 kg] 63.5 kg (01/23 1100) Last BM Date: 07/10/18  Intake/Output from previous day: 01/23 0701 - 01/24 0700 In: 1624.6 [P.O.:480; I.V.:1144.6] Out: 820 [Urine:800; Blood:20] Intake/Output this shift: No intake/output data recorded.  General appearance: alert and cooperative Resp: clear to auscultation bilaterally Cardio: regular rate and rhythm GI: soft, mild tenderness. incisions look good  Lab Results:  No results for input(s): WBC, HGB, HCT, PLT in the last 72 hours. BMET No results for input(s): NA, K, CL, CO2, GLUCOSE, BUN, CREATININE, CALCIUM in the last 72 hours. PT/INR No results for input(s): LABPROT, INR in the last 72 hours. ABG No results for input(s): PHART, HCO3 in the last 72 hours.  Invalid input(s): PCO2, PO2  Studies/Results: Dg Cholangiogram Operative  Result Date: 07/12/2018 CLINICAL DATA:  Symptomatic cholelithiasis EXAM: INTRAOPERATIVE CHOLANGIOGRAM TECHNIQUE: Cholangiographic images from the C-arm fluoroscopic device were submitted for interpretation post-operatively. Please see the procedural report for the amount of contrast and the fluoroscopy time utilized. COMPARISON:  03/20/2018 FINDINGS: Cholangiogram performed during the operative procedure. The residual cystic duct, biliary confluence, common hepatic duct, and common bile duct are all patent. Contrast easily drains into the duodenum. Cystic duct has a low insertion on the CBD, normal variant. No filling defect, stricture, obstruction, occlusive process, or large filling defect. IMPRESSION: Patent biliary system. Electronically Signed   By: Jerilynn Mages.   Shick M.D.   On: 07/12/2018 09:17    Anti-infectives: Anti-infectives (From admission, onward)   Start     Dose/Rate Route Frequency Ordered Stop   07/12/18 0645  ciprofloxacin (CIPRO) IVPB 400 mg     400 mg 200 mL/hr over 60 Minutes Intravenous On call to O.R. 07/12/18 [id number] 07/12/18 0823      Assessment/Plan: s/p Procedure(s): LAPAROSCOPIC CHOLECYSTECTOMY WITH INTRAOPERATIVE CHOLANGIOGRAM ERAS PATHWAY (N/A) Advance diet Discharge  LOS: 0 days    Autumn Messing III 07/13/2018

## 2018-07-13 NOTE — Discharge Summary (Signed)
Physician Discharge Summary  Patient ID: Jamie Villa MRN: [id number] DOB/AGE: 1945/08/14 73 y.o.  Admit date: 07/12/2018 Discharge date: 07/13/2018  Admission Diagnoses:  Discharge Diagnoses:  Active Problems:   Gallstones   Discharged Condition: good  Hospital Course: the pt underwent lap chole. She tolerated surgery well. On pod 1 she was ready for discharge home  Consults: None  Significant Diagnostic Studies: none  Treatments: surgery: as above  Discharge Exam: Blood pressure 106/78, pulse 81, temperature 97.9 F (36.6 C), temperature source Oral, resp. rate 16, height 5\' 6"  (1.676 m), weight 63.5 kg, SpO2 95 %. General appearance: alert and cooperative Resp: clear to auscultation bilaterally Cardio: regular rate and rhythm GI: soft, mild tenderness  Disposition: Discharge disposition: 01-Home or Self Care       Discharge Instructions    Call MD for:  difficulty breathing, headache or visual disturbances   Complete by:  As directed    Call MD for:  extreme fatigue   Complete by:  As directed    Call MD for:  hives   Complete by:  As directed    Call MD for:  persistant dizziness or light-headedness   Complete by:  As directed    Call MD for:  persistant nausea and vomiting   Complete by:  As directed    Call MD for:  redness, tenderness, or signs of infection (pain, swelling, redness, odor or green/yellow discharge around incision site)   Complete by:  As directed    Call MD for:  severe uncontrolled pain   Complete by:  As directed    Call MD for:  temperature >100.4   Complete by:  As directed    Diet - low sodium heart healthy   Complete by:  As directed    Discharge instructions   Complete by:  As directed    May shower. Low fat diet. No heavy lifting   Increase activity slowly   Complete by:  As directed    No wound care   Complete by:  As directed      Allergies as of 07/13/2018      Reactions   Codeine Other (See Comments)   Unknown   Penicillins Hives, Other (See Comments)   DID THE REACTION INVOLVE: Swelling of the face/tongue/throat, SOB, or low BP? Unknown Sudden or severe rash/hives, skin peeling, or the inside of the mouth or nose? Unknown Did it require medical treatment? Unknown When did it last happen? Told all her life not to take If all above answers are "NO", may proceed with cephalosporin use.      Medication List    TAKE these medications   AIRBORNE PO Take 4 tablets by mouth 2 (two) times daily. Vit C   olmesartan 20 MG tablet Commonly known as:  BENICAR Take 1 tablet (20 mg total) by mouth daily.   rosuvastatin 10 MG tablet Commonly known as:  CRESTOR TAKE 1 TABLET(10 MG) BY MOUTH DAILY What changed:  See the new instructions.   spironolactone 25 MG tablet Commonly known as:  ALDACTONE Take 1 tablet (25 mg total) by mouth daily.   traMADol 50 MG tablet Commonly known as:  ULTRAM Take 1-2 tablets (50-100 mg total) by mouth every 6 (six) hours as needed for moderate pain (unrelieved by hydrocodone).   Vitamin D3 1.25 MG (50000 UT) Caps TAKE 1 TABLET BY MOUTH 1 TIME A WEEK What changed:  See the new instructions.      Follow-up Information  Autumn Messing III, MD Follow up in 2 week(s).   Specialty:  General Surgery Contact information: [address] [address] [phone]           Signed: Autumn Messing III 07/13/2018, 8:35 AM

## 2018-07-13 NOTE — Progress Notes (Signed)
Discharge instructions given to pt and all questions were answered. Pt taken down via wheelchair and was picked up by her son.  

## 2018-08-31 DIAGNOSIS — H21233 Degeneration of iris (pigmentary), bilateral: Secondary | ICD-10-CM | POA: Diagnosis not present

## 2018-08-31 DIAGNOSIS — H1851 Endothelial corneal dystrophy: Secondary | ICD-10-CM | POA: Diagnosis not present

## 2018-08-31 DIAGNOSIS — H40013 Open angle with borderline findings, low risk, bilateral: Secondary | ICD-10-CM | POA: Diagnosis not present

## 2018-08-31 DIAGNOSIS — H04123 Dry eye syndrome of bilateral lacrimal glands: Secondary | ICD-10-CM | POA: Diagnosis not present

## 2018-11-19 ENCOUNTER — Other Ambulatory Visit: Payer: Self-pay | Admitting: Internal Medicine

## 2018-11-19 DIAGNOSIS — E559 Vitamin D deficiency, unspecified: Secondary | ICD-10-CM

## 2018-11-19 MED ORDER — VITAMIN D3 1.25 MG (50000 UT) PO CAPS: 50000.0000 [IU] | ORAL_CAPSULE | ORAL | 0 refills | Status: DC

## 2018-11-28 ENCOUNTER — Other Ambulatory Visit: Payer: Self-pay | Admitting: Internal Medicine

## 2018-11-28 DIAGNOSIS — E876 Hypokalemia: Secondary | ICD-10-CM

## 2018-11-28 DIAGNOSIS — E785 Hyperlipidemia, unspecified: Secondary | ICD-10-CM

## 2018-11-28 DIAGNOSIS — I10 Essential (primary) hypertension: Secondary | ICD-10-CM

## 2018-11-28 MED ORDER — ROSUVASTATIN CALCIUM 10 MG PO TABS: 10.0000 mg | ORAL_TABLET | Freq: Every day | ORAL | 1 refills | Status: DC

## 2019-01-03 ENCOUNTER — Other Ambulatory Visit: Payer: Self-pay | Admitting: Internal Medicine

## 2019-01-21 ENCOUNTER — Other Ambulatory Visit: Payer: Self-pay

## 2019-02-27 ENCOUNTER — Other Ambulatory Visit: Payer: Self-pay | Admitting: Internal Medicine

## 2019-02-27 DIAGNOSIS — I10 Essential (primary) hypertension: Secondary | ICD-10-CM

## 2019-02-27 MED ORDER — OLMESARTAN MEDOXOMIL 20 MG PO TABS: ORAL_TABLET | ORAL | 0 refills | Status: DC

## 2019-03-13 ENCOUNTER — Telehealth: Payer: Self-pay | Admitting: Internal Medicine

## 2019-03-13 NOTE — Telephone Encounter (Signed)
Pt called and scheduled an appt, pt will come in Monday 03/18/2019 at 1:20 pm for appt with labs

## 2019-03-13 NOTE — Telephone Encounter (Signed)
Left detailed message for pt to call back and schedule an appt for refills.

## 2019-03-13 NOTE — Telephone Encounter (Signed)
She needs to be seen with labs  TJ

## 2019-03-13 NOTE — Telephone Encounter (Signed)
Pt request refill  spironolactone (ALDACTONE) 25 MG tablet  90 day  [facility] [id number] - [place], [place] - [address] AT [place] [phone] (Phone) [phone] (Fax)

## 2019-03-18 ENCOUNTER — Ambulatory Visit (INDEPENDENT_AMBULATORY_CARE_PROVIDER_SITE_OTHER): Payer: Medicare Other | Admitting: Internal Medicine

## 2019-03-18 ENCOUNTER — Other Ambulatory Visit: Payer: Self-pay

## 2019-03-18 ENCOUNTER — Encounter: Payer: Self-pay | Admitting: Internal Medicine

## 2019-03-18 ENCOUNTER — Other Ambulatory Visit (INDEPENDENT_AMBULATORY_CARE_PROVIDER_SITE_OTHER): Payer: Medicare Other

## 2019-03-18 VITALS — BP 124/70 | HR 94 | Temp 98.3°F | Ht 66.0 in | Wt 154.0 lb

## 2019-03-18 DIAGNOSIS — T502X5A Adverse effect of carbonic-anhydrase inhibitors, benzothiadiazides and other diuretics, initial encounter: Secondary | ICD-10-CM

## 2019-03-18 DIAGNOSIS — E876 Hypokalemia: Secondary | ICD-10-CM | POA: Diagnosis not present

## 2019-03-18 DIAGNOSIS — I10 Essential (primary) hypertension: Secondary | ICD-10-CM

## 2019-03-18 DIAGNOSIS — Z20822 Contact with and (suspected) exposure to covid-19: Secondary | ICD-10-CM

## 2019-03-18 DIAGNOSIS — E785 Hyperlipidemia, unspecified: Secondary | ICD-10-CM

## 2019-03-18 DIAGNOSIS — H8113 Benign paroxysmal vertigo, bilateral: Secondary | ICD-10-CM | POA: Diagnosis not present

## 2019-03-18 DIAGNOSIS — E559 Vitamin D deficiency, unspecified: Secondary | ICD-10-CM

## 2019-03-18 DIAGNOSIS — R739 Hyperglycemia, unspecified: Secondary | ICD-10-CM

## 2019-03-18 DIAGNOSIS — N183 Chronic kidney disease, stage 3 unspecified: Secondary | ICD-10-CM | POA: Insufficient documentation

## 2019-03-18 DIAGNOSIS — Z20828 Contact with and (suspected) exposure to other viral communicable diseases: Secondary | ICD-10-CM

## 2019-03-18 DIAGNOSIS — R110 Nausea: Secondary | ICD-10-CM

## 2019-03-18 LAB — HEMOGLOBIN A1C: Hgb A1c MFr Bld: 6 % (ref 4.6–6.5)

## 2019-03-18 LAB — BASIC METABOLIC PANEL
BUN: 23 mg/dL (ref 6–23)
CO2: 29 mEq/L (ref 19–32)
Calcium: 10.3 mg/dL (ref 8.4–10.5)
Chloride: 105 mEq/L (ref 96–112)
Creatinine, Ser: 1.1 mg/dL (ref 0.40–1.20)
GFR: 48.42 mL/min — ABNORMAL LOW (ref >60.00)
Glucose, Bld: 88 mg/dL (ref 70–99)
Potassium: 4.7 mEq/L (ref 3.5–5.1)
Sodium: 143 mEq/L (ref 135–145)

## 2019-03-18 LAB — CBC WITH DIFFERENTIAL/PLATELET
Basophils Absolute: 0 10*3/uL (ref 0.0–0.1)
Basophils Relative: 0.3 % (ref 0.0–3.0)
Eosinophils Absolute: 0.1 10*3/uL (ref 0.0–0.7)
Eosinophils Relative: 0.9 % (ref 0.0–5.0)
HCT: 38.9 % (ref 36.0–46.0)
Hemoglobin: 13 g/dL (ref 12.0–15.0)
Lymphocytes Relative: 29.8 % (ref 12.0–46.0)
Lymphs Abs: 2 10*3/uL (ref 0.7–4.0)
MCHC: 33.4 g/dL (ref 30.0–36.0)
MCV: 98.1 fl (ref 78.0–100.0)
Monocytes Absolute: 0.7 10*3/uL (ref 0.1–1.0)
Monocytes Relative: 10.6 % (ref 3.0–12.0)
Neutro Abs: 3.9 10*3/uL (ref 1.4–7.7)
Neutrophils Relative %: 58.4 % (ref 43.0–77.0)
Platelets: 228 10*3/uL (ref 150.0–400.0)
RBC: 3.97 Mil/uL (ref 3.87–5.11)
RDW: 12.6 % (ref 11.5–15.5)
WBC: 6.6 10*3/uL (ref 4.0–10.5)

## 2019-03-18 LAB — SARS-COV-2 IGG: SARS-COV-2 IgG: 0.02

## 2019-03-18 LAB — LIPID PANEL
Cholesterol: 159 mg/dL (ref 0–200)
HDL: 81.4 mg/dL (ref >39.00)
LDL Cholesterol: 50 mg/dL (ref 0–99)
NonHDL: 77.37
Total CHOL/HDL Ratio: 2
Triglycerides: 136 mg/dL (ref 0.0–149.0)
VLDL: 27.2 mg/dL (ref 0.0–40.0)

## 2019-03-18 LAB — VITAMIN D 25 HYDROXY (VIT D DEFICIENCY, FRACTURES): VITD: 55.24 ng/mL (ref 30.00–100.00)

## 2019-03-18 LAB — TSH: TSH: 3.71 u[IU]/mL (ref 0.35–4.50)

## 2019-03-18 MED ORDER — MECLIZINE HCL 25 MG PO TABS: 25.0000 mg | ORAL_TABLET | Freq: Three times a day (TID) | ORAL | 2 refills | Status: DC | PRN

## 2019-03-18 MED ORDER — SPIRONOLACTONE 25 MG PO TABS: ORAL_TABLET | ORAL | 1 refills | Status: DC

## 2019-03-18 MED ORDER — CHOLECALCIFEROL 50 MCG (2000 UT) PO TABS: 1.0000 | ORAL_TABLET | Freq: Every day | ORAL | 1 refills | Status: DC

## 2019-03-18 NOTE — Patient Instructions (Addendum)

## 2019-03-18 NOTE — Progress Notes (Signed)
Subjective:  Patient ID: Jamie Villa, female    DOB: 1946-03-27  Age: 73 y.o. MRN: [id number]  CC: Hypertension and Hyperlipidemia   HPI Anyla Solinsky Hilburn presents for f/up - She tells me that her neighbor was recently diagnosed with COVID-19 and she wants to undergo the antibody test.  She has rare episodes of vertigo and wants a refill of meclizine.  She is active and denies any recent episodes of headache, blurred vision, chest pain, or shortness of breath.  Outpatient Medications Prior to Visit  Medication Sig Dispense Refill  . Multiple Vitamins-Minerals (AIRBORNE PO) Take 4 tablets by mouth 2 (two) times daily. Vit C    . olmesartan (BENICAR) 20 MG tablet TAKE 1 TABLET(20 MG) BY MOUTH DAILY 90 tablet 0  . rosuvastatin (CRESTOR) 10 MG tablet Take 1 tablet (10 mg total) by mouth daily. 90 tablet 1  . Cholecalciferol (VITAMIN D3) 1.25 MG (50000 UT) CAPS Take 50,000 Units by mouth once a week. 4 capsule 0  . spironolactone (ALDACTONE) 25 MG tablet TAKE 1 TABLET(25 MG) BY MOUTH DAILY 90 tablet 0  . traMADol (ULTRAM) 50 MG tablet Take 1-2 tablets (50-100 mg total) by mouth every 6 (six) hours as needed for moderate pain (unrelieved by hydrocodone). 20 tablet 0   No facility-administered medications prior to visit.     ROS Review of Systems  Constitutional: Negative.  Negative for chills, diaphoresis, fatigue and fever.  HENT: Negative.   Eyes: Negative.   Respiratory: Negative.  Negative for cough, chest tightness, shortness of breath and wheezing.   Cardiovascular: Negative for chest pain, palpitations and leg swelling.  Gastrointestinal: Negative for abdominal pain, constipation, diarrhea, nausea and vomiting.  Endocrine: Negative.   Genitourinary: Negative.  Negative for difficulty urinating.  Musculoskeletal: Negative for arthralgias and myalgias.  Skin: Negative.  Negative for color change and pallor.  Neurological: Negative for dizziness, weakness, light-headedness,  numbness and headaches.  Hematological: Negative for adenopathy. Does not bruise/bleed easily.  Psychiatric/Behavioral: Negative.     Objective:  BP 124/70 (BP Location: Left Arm, Patient Position: Sitting, Cuff Size: Normal)   Pulse 94   Temp 98.3 F (36.8 C) (Oral)   Ht 5\' 6"  (1.676 m)   Wt 154 lb (69.9 kg)   SpO2 97%   BMI 24.86 kg/m   BP Readings from Last 3 Encounters:  03/18/19 124/70  07/13/18 106/78  07/05/18 115/85    Wt Readings from Last 3 Encounters:  03/18/19 154 lb (69.9 kg)  07/12/18 140 lb (63.5 kg)  07/05/18 154 lb 2 oz (69.9 kg)    Physical Exam Vitals signs reviewed.  Constitutional:      Appearance: Normal appearance.  HENT:     Nose: Nose normal.     Mouth/Throat:     Mouth: Mucous membranes are moist.  Eyes:     General: No scleral icterus.    Conjunctiva/sclera: Conjunctivae normal.  Neck:     Musculoskeletal: Normal range of motion and neck supple.  Cardiovascular:     Rate and Rhythm: Normal rate and regular rhythm.     Heart sounds: No murmur.  Pulmonary:     Effort: Pulmonary effort is normal.     Breath sounds: No stridor. No wheezing, rhonchi or rales.  Abdominal:     General: Abdomen is protuberant.     Palpations: There is no hepatomegaly or splenomegaly.  Musculoskeletal: Normal range of motion.     Right lower leg: No edema.     Left  lower leg: No edema.  Lymphadenopathy:     Cervical: No cervical adenopathy.  Skin:    General: Skin is warm and dry.  Neurological:     General: No focal deficit present.     Mental Status: She is alert.  Psychiatric:        Mood and Affect: Mood normal.     Lab Results  Component Value Date   WBC 6.6 03/18/2019   HGB 13.0 03/18/2019   HCT 38.9 03/18/2019   PLT 228.0 03/18/2019   GLUCOSE 88 03/18/2019   CHOL 159 03/18/2019   TRIG 136.0 03/18/2019   HDL 81.40 03/18/2019   LDLDIRECT 60.0 12/13/2017   LDLCALC 50 03/18/2019   ALT 17 04/09/2018   AST 19 04/09/2018   NA 143  03/18/2019   K 4.7 03/18/2019   CL 105 03/18/2019   CREATININE 1.10 03/18/2019   BUN 23 03/18/2019   CO2 29 03/18/2019   TSH 3.71 03/18/2019   INR 0.90 10/14/2009   HGBA1C 6.0 03/18/2019    No results found.  Assessment & Plan:   Jhade was seen today for hypertension and hyperlipidemia.  Diagnoses and all orders for this visit:  Essential hypertension- Her blood pressure is well controlled.  Electrolytes and renal function are normal. -     CBC with Differential/Platelet; Future -     Basic metabolic panel; Future -     TSH; Future -     spironolactone (ALDACTONE) 25 MG tablet; TAKE 1 TABLET(25 MG) BY MOUTH DAILY  Dyslipidemia, goal LDL below 100- Hhe has achieved her LDL goal and is doing well on the statin. -     Lipid panel; Future -     TSH; Future  Hyperglycemia- Her A1c is at 6.0%.  She is prediabetic.  Medical therapy is not indicated. -     CBC with Differential/Platelet; Future -     Hemoglobin A1c; Future  Vitamin D deficiency -     VITAMIN D 25 Hydroxy (Vit-D Deficiency, Fractures); Future -     Cholecalciferol 50 MCG (2000 UT) TABS; Take 1 tablet (2,000 Units total) by mouth daily.  Exposure to Covid-19 Virus- Her antibody test is negative and she is asymptomatic -     SARS-COV-2 IgG; Future  Nausea without vomiting  BPPV (benign paroxysmal positional vertigo), bilateral -     meclizine (ANTIVERT) 25 MG tablet; Take 1 tablet (25 mg total) by mouth 3 (three) times daily as needed for dizziness.  Diuretic-induced hypokalemia -     spironolactone (ALDACTONE) 25 MG tablet; TAKE 1 TABLET(25 MG) BY MOUTH DAILY  Chronic renal disease, stage 3, moderately decreased glomerular filtration rate (GFR) between 30-59 mL/min/1.73 square meter ([place])- She was advised to avoid nephrotoxic agents.   I have discontinued Tenna Child. Hilburn's traMADol and Vitamin D3. I am also having her start on Cholecalciferol. Additionally, I am having her maintain her Multiple  Vitamins-Minerals (AIRBORNE PO), rosuvastatin, olmesartan, meclizine, and spironolactone.  Meds ordered this encounter  Medications  . meclizine (ANTIVERT) 25 MG tablet    Sig: Take 1 tablet (25 mg total) by mouth 3 (three) times daily as needed for dizziness.    Dispense:  65 tablet    Refill:  2  . spironolactone (ALDACTONE) 25 MG tablet    Sig: TAKE 1 TABLET(25 MG) BY MOUTH DAILY    Dispense:  90 tablet    Refill:  1  . Cholecalciferol 50 MCG (2000 UT) TABS    Sig: Take 1 tablet (  2,000 Units total) by mouth daily.    Dispense:  90 tablet    Refill:  1     Follow-up: Return in about 6 months (around 09/15/2019).  Scarlette Calico, MD

## 2019-03-19 ENCOUNTER — Encounter: Payer: Self-pay | Admitting: Internal Medicine

## 2019-05-03 ENCOUNTER — Ambulatory Visit (INDEPENDENT_AMBULATORY_CARE_PROVIDER_SITE_OTHER): Payer: Medicare Other | Admitting: *Deleted

## 2019-05-03 DIAGNOSIS — Z0000 Encounter for general adult medical examination without abnormal findings: Secondary | ICD-10-CM

## 2019-05-03 NOTE — Progress Notes (Addendum)
Subjective:   Jamie Villa is a 73 y.o. female who presents for Medicare Annual (Subsequent) preventive examination. I connected with patient by a telephone and verified that I am speaking with the correct person using two identifiers. Patient stated full name and DOB. Patient gave permission to continue with telephonic visit. Patient's location was at home and Nurse's location was at [place] office. Participants during this visit included patient and nurse.  Review of Systems:   Cardiac Risk Factors include: advanced age (>71men, >34 women);dyslipidemia;hypertension Sleep patterns: feels rested on waking, gets up 0-1 times nightly to void and sleeps 8-10 hours nightly.    Home Safety/Smoke Alarms: Feels safe in home. Smoke alarms in place.  Living environment; residence and Firearm Safety: 2-story house. Lives with husband, no needs for DME, good support system Seat Belt Safety/Bike Helmet: Wears seat belt.      Objective:     Vitals: There were no vitals taken for this visit.  There is no height or weight on file to calculate BMI.  Advanced Directives 05/03/2019 07/12/2018 07/05/2018 03/02/2018 02/22/2017 09/17/2015  Does Patient Have a Medical Advance Directive? Yes Yes Yes Yes Yes Yes  Type of Paramedic of [place];Living will Living will Living will [place];Living will [place];Living will [place];Living will  Does patient want to make changes to medical advance directive? - No - Patient declined No - Patient declined - - No - Patient declined  Copy of [place] in Chart? No - copy requested - - No - copy requested No - copy requested Yes    Tobacco Social History   Tobacco Use  Smoking Status Never Smoker  Smokeless Tobacco Never Used     Counseling given: Not Answered  Past Medical History:  Diagnosis Date  . Cancer ([place])    Large B cell Lymphoma  . Complication of  anesthesia    Difficult to arouse  . Hiatal hernia   . HTN (hypertension)   . Hx of colonic polyps   . Hyperlipidemia   . Hypothyroid   . Pneumonia    Healthcare-associated versus community-acquired pneumonia  . PONV (postoperative nausea and vomiting)    Past Surgical History:  Procedure Laterality Date  . ABDOMINAL HYSTERECTOMY    . CHOLECYSTECTOMY N/A 07/12/2018   Procedure: LAPAROSCOPIC CHOLECYSTECTOMY WITH INTRAOPERATIVE CHOLANGIOGRAM ERAS PATHWAY;  Surgeon: Jovita Kussmaul, MD;  Location: [facility];  Service: General;  Laterality: N/A;  . COLONOSCOPY  11/2017  . S/P STEM CELL TRANSPLANT     Large B-Cell lymphoma   Family History  Problem Relation Age of Onset  . Hypertension Other   . Coronary artery disease Neg Hx    Social History   Socioeconomic History  . Marital status: Married    Spouse name: Not on file  . Number of children: 2  . Years of education: Not on file  . Highest education level: Not on file  Occupational History  . 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 Financial resource strain: Not hard at all  . Food insecurity    Worry: Never true    Inability: Never true  . Transportation needs    Medical: No    Non-medical: No  Tobacco Use  . Smoking status: Never Smoker  . Smokeless tobacco: Never Used  Substance and Sexual Activity  . Alcohol use: No  . Drug use: No  .  Sexual activity: Not Currently  Lifestyle  . Physical activity    Days per week: 3 days    Minutes per session: 40 min  . Stress: Not at all  Relationships  . Social connections    Talks on phone: More than three times a week    Gets together: More than three times a week    Attends religious service: 1 to 4 times per year    Active member of club or organization: Yes    Attends meetings of clubs or organizations: More than 4 times per year    Relationship status: Married  Other Topics Concern  . Not on file  Social History Narrative    Married and currently lives with husband.    Outpatient Encounter Medications as of 05/03/2019  Medication Sig  . meclizine (ANTIVERT) 25 MG tablet Take 1 tablet (25 mg total) by mouth 3 (three) times daily as needed for dizziness.  . Multiple Vitamins-Minerals (AIRBORNE PO) Take 4 tablets by mouth 2 (two) times daily. Vit C  . olmesartan (BENICAR) 20 MG tablet TAKE 1 TABLET(20 MG) BY MOUTH DAILY  . rosuvastatin (CRESTOR) 10 MG tablet Take 1 tablet (10 mg total) by mouth daily.  Marland Kitchen spironolactone (ALDACTONE) 25 MG tablet TAKE 1 TABLET(25 MG) BY MOUTH DAILY  . Cholecalciferol 50 MCG (2000 UT) TABS Take 1 tablet (2,000 Units total) by mouth daily. (Patient not taking: Reported on 05/03/2019)   No facility-administered encounter medications on file as of 05/03/2019.     Activities of Daily Living In your present state of health, do you have any difficulty performing the following activities: 05/03/2019 07/12/2018  Hearing? N Y  [place]? N N  Difficulty concentrating or making decisions? N N  Walking or climbing stairs? N N  Dressing or bathing? N N  Doing errands, shopping? N N  Preparing Food and eating ? N -  Using the Toilet? N -  In the past six months, have you accidently leaked urine? N -  Do you have problems with loss of bowel control? N -  Managing your Medications? N -  Managing your Finances? N -  Housekeeping or managing your Housekeeping? N -  Some recent data might be hidden    Patient Care Team: [name], MD as PCP - Elwin Sleight, MD as Referring Physician (Internal Medicine)    Assessment:   This is a routine wellness examination for [place]. Physical assessment deferred to PCP.  Exercise Activities and Dietary recommendations Current Exercise Habits: Home exercise routine, Type of exercise: walking, Time (Minutes): 45, Frequency (Times/Week): 5, Weekly Exercise (Minutes/Week): 225, Intensity: Mild, Exercise limited by: None identified   Diet (meal preparation, eat out, water intake, caffeinated beverages, dairy products, fruits and vegetables): in general, a "healthy" diet  , well balanced.   Reviewed heart healthy. Encouraged patient to increase daily water and healthy fluid intake.  Goals    . maintain current health status     Eat healthy, exercise, enjoy family and life.    . Patient Stated     Maintain current life status, continue to enjoy life and love family.       Fall Risk Fall Risk  05/03/2019 03/18/2019 03/02/2018 02/22/2017 04/20/2016  Falls in the past year? 0 0 No No No  Number falls in past yr: 0 0 - - -  Injury with Fall? 0 0 - - -  Follow up - Falls evaluation completed - - -  Depression Screen PHQ 2/9 Scores 05/03/2019 03/18/2019 03/02/2018 02/22/2017  PHQ - 2 Score 0 1 1 1   PHQ- 9 Score - 3 2 1      Cognitive Function MMSE - Mini Mental State Exam 02/22/2017  Orientation to time 5  Orientation to Place 5  Registration 3  Attention/ Calculation 5  Recall 3  Language- name 2 objects 2  Language- repeat 1  Language- follow 3 step command 3  Language- read & follow direction 1  Write a sentence 1  Copy design 1  Total score 30       Ad8 score reviewed for issues:  Issues making decisions: no  Less interest in hobbies / activities: no  Repeats questions, stories (family complaining): no  Trouble using ordinary gadgets (microwave, computer, phone):no  Forgets the month or year: no  Mismanaging finances: no  Remembering appts: no  Daily problems with thinking and/or memory: no Ad8 score is= 0  Immunization History  Administered Date(s) Administered  . Influenza Split 05/16/2011, 04/03/2012  . Pneumococcal Conjugate-13 04/29/2010  . Pneumococcal Polysaccharide-23 05/08/2014  . Td 06/20/2004, 12/05/2013  . Zoster 06/04/2013   Screening Tests Health Maintenance  Topic Date Due  . INFLUENZA VACCINE  09/19/2019 (Originally 01/19/2019)  . COLONOSCOPY  12/07/2022  .  TETANUS/TDAP  12/06/2023  . DEXA SCAN  Completed  . Hepatitis C Screening  Completed  . PNA vac Low Risk Adult  Completed      Plan:    Reviewed health maintenance screenings with patient today and relevant education, vaccines, and/or referrals were provided.   Continue to eat heart healthy diet (full of fruits, vegetables, whole grains, lean protein, water--limit salt, fat, and sugar intake) and increase physical activity as tolerated.  Continue doing brain stimulating activities (puzzles, reading, adult coloring books, staying active) to keep memory sharp.   I have personally reviewed and noted the following in the patient's chart:   . Medical and social history . Use of alcohol, tobacco or illicit drugs  . Current medications and supplements . Functional ability and status . Nutritional status . Physical activity . Advanced directives . List of other physicians . Screenings to include cognitive, depression, and falls . Referrals and appointments  In addition, I have reviewed and discussed with patient certain preventive protocols, quality metrics, and best practice recommendations. A written personalized care plan for preventive services as well as general preventive health recommendations were provided to patient.     Michiel Cowboy, RN  05/03/2019   Medical screening examination/treatment/procedure(s) were performed by non-physician practitioner and as supervising provider I was immediately available for consultation/collaboration.  I agree with above. Marrian Salvage, FNP

## 2019-05-06 DIAGNOSIS — C8330 Diffuse large B-cell lymphoma, unspecified site: Secondary | ICD-10-CM | POA: Diagnosis not present

## 2019-05-06 DIAGNOSIS — Z9481 Bone marrow transplant status: Secondary | ICD-10-CM | POA: Diagnosis not present

## 2019-05-13 ENCOUNTER — Other Ambulatory Visit: Payer: Self-pay | Admitting: Family

## 2019-05-13 ENCOUNTER — Other Ambulatory Visit: Payer: Self-pay | Admitting: Internal Medicine

## 2019-05-13 DIAGNOSIS — E785 Hyperlipidemia, unspecified: Secondary | ICD-10-CM

## 2019-05-13 DIAGNOSIS — I10 Essential (primary) hypertension: Secondary | ICD-10-CM

## 2019-05-13 MED ORDER — ROSUVASTATIN CALCIUM 10 MG PO TABS: 10.0000 mg | ORAL_TABLET | Freq: Every day | ORAL | 1 refills | Status: DC

## 2019-06-13 ENCOUNTER — Other Ambulatory Visit: Payer: Self-pay | Admitting: *Deleted

## 2019-06-13 DIAGNOSIS — I10 Essential (primary) hypertension: Secondary | ICD-10-CM

## 2019-06-13 MED ORDER — OLMESARTAN MEDOXOMIL 20 MG PO TABS: 20.0000 mg | ORAL_TABLET | Freq: Every day | ORAL | 0 refills | Status: DC

## 2019-08-19 ENCOUNTER — Other Ambulatory Visit: Payer: Self-pay | Admitting: Internal Medicine

## 2019-08-19 DIAGNOSIS — I10 Essential (primary) hypertension: Secondary | ICD-10-CM

## 2019-08-19 DIAGNOSIS — E876 Hypokalemia: Secondary | ICD-10-CM

## 2019-10-22 ENCOUNTER — Ambulatory Visit (INDEPENDENT_AMBULATORY_CARE_PROVIDER_SITE_OTHER): Payer: Medicare Other | Admitting: Internal Medicine

## 2019-10-22 ENCOUNTER — Other Ambulatory Visit: Payer: Self-pay

## 2019-10-22 ENCOUNTER — Encounter: Payer: Self-pay | Admitting: Internal Medicine

## 2019-10-22 VITALS — BP 116/58 | HR 100 | Temp 98.1°F | Resp 16 | Ht 66.0 in | Wt 162.1 lb

## 2019-10-22 DIAGNOSIS — T502X5A Adverse effect of carbonic-anhydrase inhibitors, benzothiadiazides and other diuretics, initial encounter: Secondary | ICD-10-CM | POA: Diagnosis not present

## 2019-10-22 DIAGNOSIS — I10 Essential (primary) hypertension: Secondary | ICD-10-CM

## 2019-10-22 DIAGNOSIS — R200 Anesthesia of skin: Secondary | ICD-10-CM

## 2019-10-22 DIAGNOSIS — N1832 Chronic kidney disease, stage 3b: Secondary | ICD-10-CM | POA: Diagnosis not present

## 2019-10-22 DIAGNOSIS — E876 Hypokalemia: Secondary | ICD-10-CM

## 2019-10-22 DIAGNOSIS — R202 Paresthesia of skin: Secondary | ICD-10-CM | POA: Diagnosis not present

## 2019-10-22 DIAGNOSIS — E559 Vitamin D deficiency, unspecified: Secondary | ICD-10-CM

## 2019-10-22 DIAGNOSIS — R739 Hyperglycemia, unspecified: Secondary | ICD-10-CM | POA: Diagnosis not present

## 2019-10-22 NOTE — Patient Instructions (Signed)

## 2019-10-22 NOTE — Progress Notes (Signed)
Subjective:  Patient ID: Jamie Villa, female    DOB: 03/04/1946  Age: 74 y.o. MRN: [id number]  CC: Hypertension  This visit occurred during the SARS-CoV-2 public health emergency.  Safety protocols were in place, including screening questions prior to the visit, additional usage of staff PPE, and extensive cleaning of exam room while observing appropriate contact time as indicated for disinfecting solutions.    HPI CRISTINE MARTINCIC presents for f/up - She complains of visual disturbance that she describes as floaters.  She tells me she saw a neurologist about this in 2017 and he thought it might be seizures related to an abnormal sodium level.  She does not describe any seizure activity.  She complains of chronic numbness and tingling in her toes.  She does not monitor her blood pressure and denies any recent episodes of dizziness, lightheadedness, chest pain, shortness of breath, edema, or fatigue.  Outpatient Medications Prior to Visit  Medication Sig Dispense Refill  . Cholecalciferol 50 MCG (2000 UT) TABS Take 1 tablet (2,000 Units total) by mouth daily. 90 tablet 1  . meclizine (ANTIVERT) 25 MG tablet Take 1 tablet (25 mg total) by mouth 3 (three) times daily as needed for dizziness. 65 tablet 2  . Multiple Vitamins-Minerals (AIRBORNE PO) Take 4 tablets by mouth 2 (two) times daily. Vit C    . rosuvastatin (CRESTOR) 10 MG tablet Take 1 tablet (10 mg total) by mouth daily. 90 tablet 1  . olmesartan (BENICAR) 20 MG tablet Take 1 tablet (20 mg total) by mouth daily. 90 tablet 0  . spironolactone (ALDACTONE) 25 MG tablet TAKE 1 TABLET(25 MG) BY MOUTH DAILY 90 tablet 0   No facility-administered medications prior to visit.    ROS Review of Systems  Constitutional: Negative.  Negative for appetite change, diaphoresis, fatigue and unexpected weight change.  HENT: Negative.   Eyes: Positive for visual disturbance. Negative for pain.  Respiratory: Negative for cough, chest tightness,  shortness of breath and wheezing.   Cardiovascular: Negative for chest pain, palpitations and leg swelling.  Gastrointestinal: Negative for abdominal pain, constipation, diarrhea, nausea and vomiting.  Endocrine: Negative for polyuria.  Genitourinary: Negative.  Negative for difficulty urinating, dysuria and frequency.  Musculoskeletal: Negative.  Negative for arthralgias and myalgias.  Skin: Negative for color change and pallor.  Neurological: Positive for numbness. Negative for dizziness, seizures, facial asymmetry, speech difficulty, weakness, light-headedness and headaches.  Hematological: Negative for adenopathy. Does not bruise/bleed easily.  Psychiatric/Behavioral: Negative.     Objective:  BP (!) 116/58 (BP Location: Left Arm, Patient Position: Sitting, Cuff Size: Large)   Pulse 100   Temp 98.1 F (36.7 C) (Oral)   Resp 16   Ht 5\' 6"  (1.676 m)   Wt 162 lb 2 oz (73.5 kg)   SpO2 98%   BMI 26.17 kg/m   BP Readings from Last 3 Encounters:  10/22/19 (!) 116/58  03/18/19 124/70  07/13/18 106/78    Wt Readings from Last 3 Encounters:  10/22/19 162 lb 2 oz (73.5 kg)  03/18/19 154 lb (69.9 kg)  07/12/18 140 lb (63.5 kg)    Physical Exam Vitals reviewed.  Constitutional:      Appearance: Normal appearance.  HENT:     Nose: Nose normal.     Mouth/Throat:     Mouth: Mucous membranes are moist.  Eyes:     General: No scleral icterus.    Conjunctiva/sclera: Conjunctivae normal.  Cardiovascular:     Rate and Rhythm: Normal rate  and regular rhythm.     Heart sounds: No murmur.  Pulmonary:     Effort: Pulmonary effort is normal.     Breath sounds: No stridor. No wheezing, rhonchi or rales.  Abdominal:     General: Abdomen is flat. Bowel sounds are normal. There is no distension.     Palpations: Abdomen is soft. There is no hepatomegaly, splenomegaly or mass.     Tenderness: There is no abdominal tenderness.  Musculoskeletal:        General: Normal range of motion.      Cervical back: Neck supple.     Right lower leg: No edema.     Left lower leg: No edema.  Lymphadenopathy:     Cervical: No cervical adenopathy.  Skin:    General: Skin is warm and dry.  Neurological:     General: No focal deficit present.     Mental Status: She is alert.  Psychiatric:        Mood and Affect: Mood normal.        Behavior: Behavior normal.     Lab Results  Component Value Date   WBC 7.6 10/22/2019   HGB 12.3 10/22/2019   HCT 36.4 10/22/2019   PLT 238.0 10/22/2019   GLUCOSE 133 (H) 10/22/2019   CHOL 159 03/18/2019   TRIG 136.0 03/18/2019   HDL 81.40 03/18/2019   LDLDIRECT 60.0 12/13/2017   LDLCALC 50 03/18/2019   ALT 19 10/22/2019   AST 21 10/22/2019   NA 139 10/22/2019   K 5.2 No hemolysis seen (H) 10/22/2019   CL 104 10/22/2019   CREATININE 1.92 (H) 10/22/2019   BUN 45 (H) 10/22/2019   CO2 27 10/22/2019   TSH 3.71 03/18/2019   INR 0.90 10/14/2009   HGBA1C 5.7 10/22/2019    No results found.  Assessment & Plan:   Leiny was seen today for hypertension.  Diagnoses and all orders for this visit:  Essential hypertension- Her blood pressure is overcontrolled and she has a prerenal azotemia with hyperkalemia.  I have asked her to stop taking spironolactone and the ARB. -     CBC with Differential/Platelet; Future -     Hepatic function panel; Future -     Hepatic function panel -     CBC with Differential/Platelet  Stage 3b chronic kidney disease- Her renal function has declined some.  I have offered her referral to nephrology.  Will discontinue the diuretic and the ARB. -     Basic metabolic panel; Future -     Basic metabolic panel -     Ambulatory referral to Nephrology  Diuretic-induced hypokalemia- See above. -     Basic metabolic panel; Future -     Basic metabolic panel  Hyperglycemia- Her A1c is normal at 5.7%. -     Hemoglobin A1c; Future -     Hemoglobin A1c  Vitamin D deficiency- Her vitamin D level is normal now. -      VITAMIN D 25 Hydroxy (Vit-D Deficiency, Fractures); Future -     VITAMIN D 25 Hydroxy (Vit-D Deficiency, Fractures)  Numbness and tingling of both feet -     Ambulatory referral to Neurology   I have discontinued Vaughan Basta H. Sewell's olmesartan and spironolactone. I am also having her maintain her Multiple Vitamins-Minerals (AIRBORNE PO), meclizine, Cholecalciferol, and rosuvastatin.  No orders of the defined types were placed in this encounter.    Follow-up: Return in about 6 months (around 04/23/2020).  Scarlette Calico, MD

## 2019-10-23 ENCOUNTER — Encounter: Payer: Self-pay | Admitting: Internal Medicine

## 2019-10-23 ENCOUNTER — Telehealth: Payer: Self-pay | Admitting: Internal Medicine

## 2019-10-23 DIAGNOSIS — E559 Vitamin D deficiency, unspecified: Secondary | ICD-10-CM

## 2019-10-23 LAB — HEMOGLOBIN A1C: Hgb A1c MFr Bld: 5.7 % (ref 4.6–6.5)

## 2019-10-23 LAB — HEPATIC FUNCTION PANEL
ALT: 19 U/L (ref 0–35)
AST: 21 U/L (ref 0–37)
Albumin: 4.5 g/dL (ref 3.5–5.2)
Alkaline Phosphatase: 83 U/L (ref 39–117)
Bilirubin, Direct: 0.1 mg/dL (ref 0.0–0.3)
Total Bilirubin: 0.6 mg/dL (ref 0.2–1.2)
Total Protein: 7.2 g/dL (ref 6.0–8.3)

## 2019-10-23 LAB — BASIC METABOLIC PANEL
BUN: 45 mg/dL — ABNORMAL HIGH (ref 6–23)
CO2: 27 mEq/L (ref 19–32)
Calcium: 9.7 mg/dL (ref 8.4–10.5)
Chloride: 104 mEq/L (ref 96–112)
Creatinine, Ser: 1.92 mg/dL — ABNORMAL HIGH (ref 0.40–1.20)
GFR: 25.42 mL/min — ABNORMAL LOW (ref >60.00)
Glucose, Bld: 133 mg/dL — ABNORMAL HIGH (ref 70–99)
Potassium: 5.2 mEq/L — ABNORMAL HIGH (ref 3.5–5.1)
Sodium: 139 mEq/L (ref 135–145)

## 2019-10-23 LAB — CBC WITH DIFFERENTIAL/PLATELET
Basophils Absolute: 0.1 10*3/uL (ref 0.0–0.1)
Basophils Relative: 0.8 % (ref 0.0–3.0)
Eosinophils Absolute: 0.1 10*3/uL (ref 0.0–0.7)
Eosinophils Relative: 0.8 % (ref 0.0–5.0)
HCT: 36.4 % (ref 36.0–46.0)
Hemoglobin: 12.3 g/dL (ref 12.0–15.0)
Lymphocytes Relative: 25.7 % (ref 12.0–46.0)
Lymphs Abs: 2 10*3/uL (ref 0.7–4.0)
MCHC: 33.9 g/dL (ref 30.0–36.0)
MCV: 100.8 fl — ABNORMAL HIGH (ref 78.0–100.0)
Monocytes Absolute: 0.9 10*3/uL (ref 0.1–1.0)
Monocytes Relative: 11.1 % (ref 3.0–12.0)
Neutro Abs: 4.7 10*3/uL (ref 1.4–7.7)
Neutrophils Relative %: 61.6 % (ref 43.0–77.0)
Platelets: 238 10*3/uL (ref 150.0–400.0)
RBC: 3.61 Mil/uL — ABNORMAL LOW (ref 3.87–5.11)
RDW: 12.9 % (ref 11.5–15.5)
WBC: 7.6 10*3/uL (ref 4.0–10.5)

## 2019-10-23 LAB — VITAMIN D 25 HYDROXY (VIT D DEFICIENCY, FRACTURES): VITD: 47.8 ng/mL (ref 30.00–100.00)

## 2019-10-23 MED ORDER — CHOLECALCIFEROL 50 MCG (2000 UT) PO TABS: 1.0000 | ORAL_TABLET | Freq: Every day | ORAL | 1 refills | Status: AC

## 2019-10-23 NOTE — Telephone Encounter (Signed)
Contacted pt and she wanted me to speak to her spouse (Darryl). Darryl want to know if the nephrologist would call or if they needed to call them to schedule. Informed pt spouse and pt that they should call within the next week.   Pt stated understanding and will comply with recommendation to stop the spironolactone and the olmesartan.   Pt will call if there are any issues and will schedule appointment with PCP if needed.

## 2019-10-23 NOTE — Telephone Encounter (Signed)
F/u  Call back to speak with the nurse, aware the MD/CMA are in clinic today seeing patients.    Asking to call back in 30 min

## 2019-10-23 NOTE — Telephone Encounter (Signed)
Please return  call to discuss lab results 

## 2019-11-04 DIAGNOSIS — Z8572 Personal history of non-Hodgkin lymphomas: Secondary | ICD-10-CM | POA: Diagnosis not present

## 2019-11-04 DIAGNOSIS — C8330 Diffuse large B-cell lymphoma, unspecified site: Secondary | ICD-10-CM | POA: Diagnosis not present

## 2019-11-04 DIAGNOSIS — Z9481 Bone marrow transplant status: Secondary | ICD-10-CM | POA: Diagnosis not present

## 2019-11-05 DIAGNOSIS — I129 Hypertensive chronic kidney disease with stage 1 through stage 4 chronic kidney disease, or unspecified chronic kidney disease: Secondary | ICD-10-CM | POA: Diagnosis not present

## 2019-11-05 DIAGNOSIS — C8330 Diffuse large B-cell lymphoma, unspecified site: Secondary | ICD-10-CM | POA: Diagnosis not present

## 2019-11-05 DIAGNOSIS — N1831 Chronic kidney disease, stage 3a: Secondary | ICD-10-CM | POA: Diagnosis not present

## 2019-11-05 DIAGNOSIS — R609 Edema, unspecified: Secondary | ICD-10-CM | POA: Diagnosis not present

## 2019-11-05 DIAGNOSIS — E559 Vitamin D deficiency, unspecified: Secondary | ICD-10-CM | POA: Diagnosis not present

## 2019-11-07 ENCOUNTER — Other Ambulatory Visit: Payer: Self-pay | Admitting: Nephrology

## 2019-11-07 ENCOUNTER — Ambulatory Visit: Payer: Medicare Other | Attending: Internal Medicine

## 2019-11-07 DIAGNOSIS — N1831 Chronic kidney disease, stage 3a: Secondary | ICD-10-CM

## 2019-11-07 DIAGNOSIS — Z23 Encounter for immunization: Secondary | ICD-10-CM

## 2019-11-07 NOTE — Progress Notes (Signed)
   Covid-19 Vaccination Clinic  Name:  Jamie Villa    MRN: [id number] DOB: [birth year]  11/07/2019  Jamie Villa was observed post Covid-19 immunization for 15 minutes without incident. She was provided with Vaccine Information Sheet and instruction to access the V-Safe system.   Jamie Villa was instructed to call 911 with any severe reactions post vaccine: Marland Kitchen Difficulty breathing  . Swelling of face and throat  . A fast heartbeat  . A bad rash all over body  . Dizziness and weakness   Immunizations Administered    Name Date Dose VIS Date Route   Pfizer COVID-19 Vaccine 11/07/2019 12:12 PM 0.3 mL 08/14/2018 Intramuscular   Manufacturer: [place]   Lot: [id number]   [employer]: [id number]

## 2019-11-12 ENCOUNTER — Encounter: Payer: Self-pay | Admitting: Internal Medicine

## 2019-11-15 ENCOUNTER — Other Ambulatory Visit: Payer: Self-pay | Admitting: Internal Medicine

## 2019-11-15 DIAGNOSIS — E785 Hyperlipidemia, unspecified: Secondary | ICD-10-CM

## 2019-11-19 ENCOUNTER — Telehealth: Payer: Self-pay | Admitting: Internal Medicine

## 2019-11-19 ENCOUNTER — Encounter: Payer: Self-pay | Admitting: Neurology

## 2019-11-19 NOTE — Telephone Encounter (Signed)
New message:    Pt is calling and states she would like to discuss Dr. Ronnald Ramp taking her off of all of her medication. Please advise.

## 2019-11-20 ENCOUNTER — Ambulatory Visit
Admission: RE | Admit: 2019-11-20 | Discharge: 2019-11-20 | Disposition: A | Discharge status: Home / Self Care | Payer: Medicare Other | Source: Ambulatory Visit | Attending: Nephrology | Admitting: Nephrology

## 2019-11-20 DIAGNOSIS — N1831 Chronic kidney disease, stage 3a: Secondary | ICD-10-CM

## 2019-11-20 DIAGNOSIS — N183 Chronic kidney disease, stage 3 unspecified: Secondary | ICD-10-CM | POA: Diagnosis not present

## 2019-11-22 NOTE — Telephone Encounter (Signed)
Spoke with patient today. Patient was calling in reference to a letter she received about just stopping her BP medications and she has stopped as directed.

## 2019-11-22 NOTE — Telephone Encounter (Signed)
Message left for patient today with info 

## 2019-11-28 ENCOUNTER — Encounter: Payer: Self-pay | Admitting: Internal Medicine

## 2019-11-28 ENCOUNTER — Ambulatory Visit (INDEPENDENT_AMBULATORY_CARE_PROVIDER_SITE_OTHER): Payer: Medicare Other | Admitting: Internal Medicine

## 2019-11-28 ENCOUNTER — Other Ambulatory Visit: Payer: Self-pay

## 2019-11-28 VITALS — BP 146/88 | HR 88 | Temp 98.2°F | Resp 16 | Ht 66.0 in | Wt 161.6 lb

## 2019-11-28 DIAGNOSIS — E039 Hypothyroidism, unspecified: Secondary | ICD-10-CM

## 2019-11-28 DIAGNOSIS — I10 Essential (primary) hypertension: Secondary | ICD-10-CM

## 2019-11-28 DIAGNOSIS — N1832 Chronic kidney disease, stage 3b: Secondary | ICD-10-CM | POA: Diagnosis not present

## 2019-11-28 LAB — BASIC METABOLIC PANEL
BUN: 24 mg/dL — ABNORMAL HIGH (ref 6–23)
CO2: 31 mEq/L (ref 19–32)
Calcium: 9.8 mg/dL (ref 8.4–10.5)
Chloride: 103 mEq/L (ref 96–112)
Creatinine, Ser: 1.1 mg/dL (ref 0.40–1.20)
GFR: 48.33 mL/min — ABNORMAL LOW (ref >60.00)
Glucose, Bld: 95 mg/dL (ref 70–99)
Potassium: 3.8 mEq/L (ref 3.5–5.1)
Sodium: 141 mEq/L (ref 135–145)

## 2019-11-28 LAB — TSH: TSH: 3.39 u[IU]/mL (ref 0.35–4.50)

## 2019-11-28 NOTE — Patient Instructions (Signed)

## 2019-11-28 NOTE — Progress Notes (Signed)
Subjective:  Patient ID: Jamie Villa, female    DOB: 1946/03/19  Age: 74 y.o. MRN: [id number]  CC: Hypertension  This visit occurred during the SARS-CoV-2 public health emergency.  Safety protocols were in place, including screening questions prior to the visit, additional usage of staff PPE, and extensive cleaning of exam room while observing appropriate contact time as indicated for disinfecting solutions.    HPI Jamie Villa presents for f/up - She complains of a recurrent sensation of floating.  She had this happen about 4 years ago.  She saw a neurologist and underwent an EEG.  Her work-up was unremarkable and no causes for the symptoms were found.  She says the symptoms returned over the last few months and she has an upcoming appointment with her neurologist.  She has been concerned recently about her blood pressure and lower extremity edema so she has started taking Lasix again.  She denies chest pain, shortness of breath, palpitations, edema, or fatigue.  She has a history of hypothyroidism but is not currently taking a thyroid supplement.  Outpatient Medications Prior to Visit  Medication Sig Dispense Refill  . Cholecalciferol 50 MCG (2000 UT) TABS Take 1 tablet (2,000 Units total) by mouth daily. 90 tablet 1  . meclizine (ANTIVERT) 25 MG tablet Take 1 tablet (25 mg total) by mouth 3 (three) times daily as needed for dizziness. 65 tablet 2  . rosuvastatin (CRESTOR) 10 MG tablet TAKE 1 TABLET(10 MG) BY MOUTH DAILY 90 tablet 1   No facility-administered medications prior to visit.    ROS Review of Systems  Constitutional: Negative.  Negative for appetite change, diaphoresis, fatigue and unexpected weight change.  HENT: Negative.   Eyes: Negative.   Respiratory: Negative for cough, chest tightness, shortness of breath and wheezing.   Cardiovascular: Negative for chest pain, palpitations and leg swelling.  Gastrointestinal: Negative for abdominal pain, constipation, diarrhea,  nausea and vomiting.  Endocrine: Negative for cold intolerance and heat intolerance.  Genitourinary: Negative.  Negative for difficulty urinating.  Musculoskeletal: Negative for arthralgias, joint swelling and myalgias.  Skin: Negative.  Negative for color change and pallor.  Neurological: Negative.  Negative for dizziness, weakness, light-headedness and headaches.  Hematological: Negative for adenopathy. Does not bruise/bleed easily.  Psychiatric/Behavioral: Negative.     Objective:  BP (!) 146/88 (BP Location: Left Arm, Patient Position: Sitting, Cuff Size: Large)   Pulse 88   Temp 98.2 F (36.8 C) (Oral)   Resp 16   Ht 5\' 6"  (1.676 m)   Wt 161 lb 9.6 oz (73.3 kg)   SpO2 97%   BMI 26.08 kg/m   BP Readings from Last 3 Encounters:  11/28/19 (!) 146/88  10/22/19 (!) 116/58  03/18/19 124/70    Wt Readings from Last 3 Encounters:  11/28/19 161 lb 9.6 oz (73.3 kg)  10/22/19 162 lb 2 oz (73.5 kg)  03/18/19 154 lb (69.9 kg)    Physical Exam Vitals reviewed.  Constitutional:      Appearance: Normal appearance.  HENT:     Nose: Nose normal.     Mouth/Throat:     Mouth: Mucous membranes are moist.  Eyes:     General: No scleral icterus.    Conjunctiva/sclera: Conjunctivae normal.  Cardiovascular:     Rate and Rhythm: Normal rate and regular rhythm.     Heart sounds: No murmur heard.   Pulmonary:     Effort: Pulmonary effort is normal.     Breath sounds: No stridor. No  wheezing, rhonchi or rales.  Abdominal:     General: Abdomen is flat. Bowel sounds are normal. There is no distension.     Palpations: Abdomen is soft.     Tenderness: There is no abdominal tenderness.  Musculoskeletal:        General: Normal range of motion.     Cervical back: Neck supple.     Right lower leg: No edema.     Left lower leg: No edema.  Lymphadenopathy:     Cervical: No cervical adenopathy.  Skin:    General: Skin is warm and dry.     Coloration: Skin is not pale.  Neurological:      General: No focal deficit present.     Mental Status: She is alert and oriented to person, place, and time. Mental status is at baseline.  Psychiatric:        Mood and Affect: Mood normal.        Behavior: Behavior normal.        Thought Content: Thought content normal.     Lab Results  Component Value Date   WBC 7.6 10/22/2019   HGB 12.3 10/22/2019   HCT 36.4 10/22/2019   PLT 238.0 10/22/2019   GLUCOSE 95 11/28/2019   CHOL 159 03/18/2019   TRIG 136.0 03/18/2019   HDL 81.40 03/18/2019   LDLDIRECT 60.0 12/13/2017   LDLCALC 50 03/18/2019   ALT 19 10/22/2019   AST 21 10/22/2019   NA 141 11/28/2019   K 3.8 11/28/2019   CL 103 11/28/2019   CREATININE 1.10 11/28/2019   BUN 24 (H) 11/28/2019   CO2 31 11/28/2019   TSH 3.39 11/28/2019   INR 0.90 10/14/2009   HGBA1C 5.7 10/22/2019    US RENAL  Result Date: 11/20/2019 CLINICAL DATA:  Initial evaluation for chronic kidney disease, stage III. EXAM: RENAL / URINARY TRACT ULTRASOUND COMPLETE COMPARISON:  Prior ultrasound from 03/20/2018. FINDINGS: Right Kidney: Renal measurements: 8.5 x 4.3 x 3.9 cm = volume: 74 mL. Renal echogenicity within normal limits. No nephrolithiasis or hydronephrosis. No focal renal mass. Left Kidney: Renal measurements: 9.0 x 5.4 x 4.0 cm = volume: 101 mL. Renal echogenicity within normal limits. No nephrolithiasis or hydronephrosis. No focal renal mass. Bladder: Appears normal for degree of bladder distention. Other: None. IMPRESSION: Normal renal ultrasound. Renal echogenicity within normal limits. No hydronephrosis. Electronically Signed   By: Jeannine Boga M.D.   On: 11/20/2019 20:52    Assessment & Plan:   [name] was seen today for hypertension.  Diagnoses and all orders for this visit:  Essential hypertension- Her blood pressure is well controlled.  Electrolytes are normal.  Renal function is stable. -     Basic metabolic panel; Future -     Basic metabolic panel  Hypothyroidism, unspecified  type- Thyroid replacement therapy is not indicated. -     TSH; Future -     TSH  Stage 3b chronic kidney disease- Her renal function is stable.  She agrees to avoid nephrotoxic agents. -     Basic metabolic panel; Future -     Basic metabolic panel   I am having Jamie Villa maintain her meclizine, Cholecalciferol, and rosuvastatin.  No orders of the defined types were placed in this encounter.    Follow-up: Return in about 6 months (around 05/29/2020).  Scarlette Calico, MD

## 2019-12-02 ENCOUNTER — Ambulatory Visit: Payer: Medicare Other | Attending: Internal Medicine

## 2019-12-02 DIAGNOSIS — Z23 Encounter for immunization: Secondary | ICD-10-CM

## 2019-12-02 NOTE — Progress Notes (Signed)
   Covid-19 Vaccination Clinic  Name:  Jamie Villa    MRN: [id number] DOB: 03-14-1946  12/02/2019  [name] was observed post Covid-19 immunization for 15 minutes without incident. She was provided with Vaccine Information Sheet and instruction to access the V-Safe system.   [name] was instructed to call 911 with any severe reactions post vaccine: Marland Kitchen Difficulty breathing  . Swelling of face and throat  . A fast heartbeat  . A bad rash all over body  . Dizziness and weakness   Immunizations Administered    Name Date Dose VIS Date Route   Pfizer COVID-19 Vaccine 12/02/2019 12:18 PM 0.3 mL 08/14/2018 Intramuscular   Manufacturer: [place]   Lot: [id number]   [place]: [id number]

## 2019-12-10 ENCOUNTER — Telehealth: Payer: Self-pay | Admitting: Internal Medicine

## 2019-12-10 NOTE — Telephone Encounter (Signed)
New message:   Pt is calling and states she would like to know if she should still be taking furosemide 20 mg. Pt states her BP was low at her last visit but is still questioning if she should be taking this medication. Please advise.

## 2019-12-10 NOTE — Telephone Encounter (Signed)
Pt contacted and informed that fursomide is not on her list. She stated that her kidney doctor is rx'ing it. She was concerned that he BP was low. Informed pt that at her last visit the BP was not low. Pt stated that PCP wanted her to keep taking it. Asked pt if she is taking her blood pressure at home. Pt stated that she was not. Pt informed that PCP wanted her to come back in 6 months (December 2021). Informed pt she can come in sooner if needed. Pt will call us to schedule sooner if needed.

## 2019-12-16 ENCOUNTER — Telehealth: Payer: Self-pay | Admitting: Neurology

## 2019-12-16 NOTE — Telephone Encounter (Signed)
Please advise 

## 2019-12-16 NOTE — Telephone Encounter (Signed)
Patient is scheduled for a NP appt 02/12/20. Called to ask if Dr. Tomi Likens has had any cancellations because she is having some symptoms that are scaring her. States it feels like the lights are blinking on and off in her eyes then she gets a headache. Unable to fully describe what is going on. Has been occurring every 2-3 days. Has been taking Tylenol for them. Patient is aware that she will need to have an appointment first but requested for a message to be sent back. She is already on the waitlist.

## 2019-12-16 NOTE — Telephone Encounter (Signed)
I will add her to the wait list

## 2019-12-16 NOTE — Telephone Encounter (Signed)
All we can do is put her on the waitlist.  If she has concerning symptoms until then, she needs to address with her PCP

## 2019-12-26 NOTE — Progress Notes (Signed)
NEUROLOGY CONSULTATION NOTE  Jamie Villa MRN: [id number] DOB: [birth year]  Referring provider: Scarlette Calico, MD Primary care provider: Scarlette Calico, MD  Reason for consult:  Neurologic symptoms  HISTORY OF PRESENT ILLNESS: Jamie Villa is a 74 year old woman with hypertension and history of non-Hodgkin's lymphoma who presents for spells.  She is accompanied by her husband who supplements history.  She had these spells several years ago.  She has history of non-Hodgkin's lymphoma in 2009 and recurrence in 2011, but these spells happened years before that.  They recurred in 2017.  She has a "funny" feeling.  She cannot elaborate any further than that.  She reports that her vision becomes "fuzzy" or grayed out.  She says she is aware during the episode and she is able to respond to other people, however she cannot remember afterwards.  There is no associated headache, phantosmia, epigastric rising, deja vu, nausea, dizziness or palpitations.  It lasts about 15 to 20 minutes.  It only occurs about 15 to 20 minutes after she drinks regular coffee.  There appears to be no correlation with her blood pressure or sugar.  Since she had visual symptoms, she saw the ophthalmologist.  She was evaluated by Dr. 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  Routine EEG that November was normal.  Follow up 24 hour ambulatory EEG, which was normal, no seizure activity noted when she drank coffee, however she did not have her habitual spells.   The spells soon resolved.  However, they returned in early May.  She is able to describe them now.  She reports onset of spinning psychedelic colors lasting 15 minutes followed by moderate to severe frontal throbbing headache lasting 1 to 2 hours.  It typically resolves with Tylenol and sleep.  She has had 6 in the past 30 days.  They do not occur with coffee (she rarely drinks coffee now).  She does endorse  increased emotional stress over the past year.  She does report remote history of headaches.  She did have an MRI of the brain without contrast on 06/30/15 to evaluate dizziness at that time, which  was unremarkable.    PAST MEDICAL HISTORY: Past Medical History:  Diagnosis Date  . Cancer ([place])    Large B cell Lymphoma  . Complication of anesthesia    Difficult to arouse  . Hiatal hernia   . HTN (hypertension)   . Hx of colonic polyps   . Hyperlipidemia   . Hypothyroid   . Pneumonia    Healthcare-associated versus community-acquired pneumonia  . PONV (postoperative nausea and vomiting)     PAST SURGICAL HISTORY: Past Surgical History:  Procedure Laterality Date  . ABDOMINAL HYSTERECTOMY    . CHOLECYSTECTOMY N/A 07/12/2018   Procedure: LAPAROSCOPIC CHOLECYSTECTOMY WITH INTRAOPERATIVE CHOLANGIOGRAM ERAS PATHWAY;  Surgeon: Jovita Kussmaul, MD;  Location: [facility];  Service: General;  Laterality: N/A;  . COLONOSCOPY  11/2017  . S/P STEM CELL TRANSPLANT     Large B-Cell lymphoma    MEDICATIONS: Current Outpatient Medications on File Prior to Visit  Medication Sig Dispense Refill  . Cholecalciferol 50 MCG (2000 UT) TABS Take 1 tablet (2,000 Units total) by mouth daily. 90 tablet 1  . furosemide (LASIX) 20 MG tablet Take 20 mg by mouth daily.    . meclizine (ANTIVERT) 25 MG tablet Take 1 tablet (25 mg total) by mouth 3 (three) times daily as needed for dizziness. 65 tablet  2  . rosuvastatin (CRESTOR) 10 MG tablet TAKE 1 TABLET(10 MG) BY MOUTH DAILY 90 tablet 1  . [DISCONTINUED] rosuvastatin (CRESTOR) 10 MG tablet Take 1 tablet (10 mg total) by mouth daily. 90 tablet 1   No current facility-administered medications on file prior to visit.    ALLERGIES: Allergies  Allergen Reactions  . Codeine Other (See Comments)    Unknown  . Penicillins Hives and Other (See Comments)    DID THE REACTION INVOLVE: Swelling of the face/tongue/throat, SOB, or low BP? Unknown Sudden or severe  rash/hives, skin peeling, or the inside of the mouth or nose? Unknown Did it require medical treatment? Unknown When did it last happen? Told all her life not to take If all above answers are "NO", may proceed with cephalosporin use.     FAMILY HISTORY: Family History  Problem Relation Age of Onset  . Hypertension Other   . Coronary artery disease Neg Hx     SOCIAL HISTORY: Social History   Socioeconomic History  . Marital status: Married    Spouse name: Not on file  . Number of children: 2  . Years of education: Not on file  . Highest education level: Not on file  Occupational History  . 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 Smoking status: Never Smoker  . Smokeless tobacco: Never Used  Vaping Use  . Vaping Use: Never used  Substance and Sexual Activity  . Alcohol use: Yes    Alcohol/week: 1.0 standard drink    Types: 1 Glasses of wine per week    Comment: 1 glass of wine several times weekly  . Drug use: No  . Sexual activity: Yes  Other Topics Concern  . Not on file  Social History Narrative   Married and currently lives with husband.   Social Determinants of Health   Financial Resource Strain:   . Difficulty of Paying Living Expenses:   Food Insecurity:   . Worried About Charity fundraiser in the Last Year:   . Arboriculturist in the Last Year:   Transportation Needs:   . Film/video editor (Medical):   Marland Kitchen Lack of Transportation (Non-Medical):   Physical Activity:   . Days of Exercise per Week:   . Minutes of Exercise per Session:   Stress: No Stress Concern Present  . Feeling of Stress : Not at all  Social Connections: Unknown  . Frequency of Communication with Friends and Family: Not on file  . Frequency of Social Gatherings with Friends and Family: Not on file  . Attends Religious Services: More than 4 times per year  . Active Member of Clubs or Organizations: Not on file  . Attends Theatre manager Meetings: Not on file  . Marital Status: Married  Human resources officer Violence: Not At Risk  . Fear of Current or Ex-Partner: No  . Emotionally Abused: No  . Physically Abused: No  . Sexually Abused: No    PHYSICAL EXAM: Blood pressure (!) 149/89, pulse 95, height 5\' 8"  (1.727 m), weight 163 lb 9.6 oz (74.2 kg), SpO2 96 %. General: No acute distress.  Patient appears well-groomed.   Head:  Normocephalic/atraumatic Eyes:  fundi examined but not visualized Neck: supple, no paraspinal tenderness, full range of motion Back: No paraspinal tenderness Heart: regular rate and rhythm Lungs: Clear to auscultation bilaterally. Vascular: No carotid bruits. Neurological Exam: Mental status: alert and oriented to person, place, and  time, recent and remote memory intact, fund of knowledge intact, attention and concentration intact, speech fluent and not dysarthric, language intact. Cranial nerves: CN I: not tested CN II: pupils equal, round and reactive to light, visual fields intact CN III, IV, VI:  full range of motion, no nystagmus, no ptosis CN V: facial sensation intact CN VII: upper and lower face symmetric CN VIII: hearing intact CN IX, X: gag intact, uvula midline CN XI: sternocleidomastoid and trapezius muscles intact CN XII: tongue midline Bulk & Tone: normal, no fasciculations. Motor:  5/5 throughout  Sensation:  temperature and vibration sensation intact.   Deep Tendon Reflexes:  2+ throughout, toes downgoing.   Finger to nose testing:  Without dysmetria.   Heel to shin:  Without dysmetria.   Gait:  Normal station and stride.  Romberg negative  IMPRESSION: Migraine with aura, without status migrainosus, not intractable.  Her description of her spells are most consistent with migraine.  PLAN: 1.  She declines starting a preventative at this time.  She wants to monitor and see if they will resolve again.  If they persist, she will contact me and I will start her on  nortriptyline. 2.  Limit use of pain relievers to no more than 2 days out of week to prevent risk of rebound or medication-overuse headache. 3.  Keep headache diary 4.  Follow up in 4 to 6 months.  Thank you for allowing me to take part in the care of this patient.  Metta Clines, DO  CC: Scarlette Calico, MD

## 2019-12-27 ENCOUNTER — Ambulatory Visit (INDEPENDENT_AMBULATORY_CARE_PROVIDER_SITE_OTHER): Payer: Medicare Other | Admitting: Neurology

## 2019-12-27 ENCOUNTER — Other Ambulatory Visit: Payer: Self-pay

## 2019-12-27 ENCOUNTER — Encounter: Payer: Self-pay | Admitting: Neurology

## 2019-12-27 VITALS — BP 149/89 | HR 95 | Ht 68.0 in | Wt 163.6 lb

## 2019-12-27 DIAGNOSIS — G43109 Migraine with aura, not intractable, without status migrainosus: Secondary | ICD-10-CM | POA: Diagnosis not present

## 2019-12-27 NOTE — Patient Instructions (Signed)
  1. If these migraines don't improve and you feel that we need to start a daily preventative medication, please contact me. 2. Limit use of pain relievers to no more than 2 days out of the week.  These medications include acetaminophen, NSAIDs (ibuprofen/Advil/Motrin, naproxen/Aleve, triptans (Imitrex/sumatriptan), Excedrin, and narcotics.  This will help reduce risk of rebound headaches. 3. Be aware of common food triggers:  - Caffeine:  coffee, black tea, cola, Mt. Dew  - Chocolate  - Dairy:  aged cheeses (brie, blue, cheddar, gouda, [place], provolone, [place], Swiss, etc), chocolate milk, buttermilk, sour cream, limit eggs and yogurt  - Nuts, peanut butter  - Alcohol  - Cereals/grains:  FRESH breads (fresh bagels, sourdough, doughnuts), yeast productions  - Processed/canned/aged/cured meats (pre-packaged deli meats, hotdogs)  - MSG/glutamate:  soy sauce, flavor enhancer, pickled/preserved/marinated foods  - Sweeteners:  aspartame (Equal, Nutrasweet).  Sugar and Splenda are okay  - Vegetables:  legumes (lima beans, lentils, snow peas, fava beans, pinto peans, peas, garbanzo beans), sauerkraut, onions, olives, pickles  - Fruit:  avocados, bananas, citrus fruit (orange, lemon, grapefruit), mango  - Other:  Frozen meals, macaroni and cheese 4. Routine exercise 5. Stay adequately hydrated (aim for 64 oz water daily) 6. Keep headache diary 7. Maintain proper stress management 8. Maintain proper sleep hygiene 9. Do not skip meals 10. Consider supplements:  magnesium citrate 400mg  daily, riboflavin 400mg  daily, coenzyme Q10 100mg  three times daily. 11. Follow up in 4 to 6 months.

## 2020-01-21 ENCOUNTER — Encounter: Payer: Self-pay | Admitting: Internal Medicine

## 2020-01-21 ENCOUNTER — Other Ambulatory Visit: Payer: Self-pay

## 2020-01-21 ENCOUNTER — Ambulatory Visit (INDEPENDENT_AMBULATORY_CARE_PROVIDER_SITE_OTHER): Payer: Medicare Other | Admitting: Internal Medicine

## 2020-01-21 VITALS — BP 152/90 | HR 87 | Temp 98.4°F | Resp 16 | Ht 68.0 in | Wt 167.0 lb

## 2020-01-21 DIAGNOSIS — I10 Essential (primary) hypertension: Secondary | ICD-10-CM

## 2020-01-21 DIAGNOSIS — G43109 Migraine with aura, not intractable, without status migrainosus: Secondary | ICD-10-CM | POA: Diagnosis not present

## 2020-01-21 MED ORDER — UBRELVY 50 MG PO TABS: 1.0000 | ORAL_TABLET | Freq: Every day | ORAL | 0 refills | Status: DC | PRN

## 2020-01-21 NOTE — Patient Instructions (Signed)

## 2020-01-21 NOTE — Progress Notes (Signed)
Subjective:  Patient ID: Jamie Villa, female    DOB: [birth year]  Age: 74 y.o. MRN: [id number]  CC: Hypertension  This visit occurred during the SARS-CoV-2 public health emergency.  Safety protocols were in place, including screening questions prior to the visit, additional usage of staff PPE, and extensive cleaning of exam room while observing appropriate contact time as indicated for disinfecting solutions.    HPI TERUKO JOSWICK presents for f/up -  1.  She recently saw a neurologist and was told that her spells are related to migraines.  About once every week or 2 she has a headache that last about 15 or 20 minutes.  She has nausea with this and a visual disturbance but she does not vomit and denies paresthesias.  She has been taking Advil migraine which provides some symptom relief.  2.  She recently saw a nephrologist and was told that her kidney function is okay.  She is taking a loop diuretic but is concerned that her blood pressure is not adequately well controlled.  She denies blurred vision, chest pain, or shortness of breath.  She has minimal lower extremity edema that is well controlled with the loop diuretic.  Outpatient Medications Prior to Visit  Medication Sig Dispense Refill  . Cholecalciferol 50 MCG (2000 UT) TABS Take 1 tablet (2,000 Units total) by mouth daily. 90 tablet 1  . furosemide (LASIX) 20 MG tablet Take 40 mg by mouth daily.     . rosuvastatin (CRESTOR) 10 MG tablet TAKE 1 TABLET(10 MG) BY MOUTH DAILY 90 tablet 1  . meclizine (ANTIVERT) 25 MG tablet Take 1 tablet (25 mg total) by mouth 3 (three) times daily as needed for dizziness. (Patient not taking: Reported on 01/21/2020) 65 tablet 2   No facility-administered medications prior to visit.    ROS Review of Systems  Constitutional: Negative.  Negative for appetite change, diaphoresis, fatigue and unexpected weight change.  HENT: Negative.  Negative for trouble swallowing.   Eyes: Negative for visual  disturbance.  Respiratory: Negative for cough, chest tightness, shortness of breath and wheezing.   Cardiovascular: Negative for chest pain, palpitations and leg swelling.  Gastrointestinal: Positive for nausea. Negative for abdominal pain, constipation and diarrhea.  Endocrine: Negative.   Genitourinary: Negative.   Musculoskeletal: Negative.  Negative for arthralgias and back pain.  Skin: Negative.  Negative for color change, pallor and rash.  Neurological: Positive for headaches. Negative for dizziness, tremors, seizures, syncope, facial asymmetry, speech difficulty, weakness and light-headedness.  Hematological: Negative for adenopathy. Does not bruise/bleed easily.  Psychiatric/Behavioral: The patient is nervous/anxious.     Objective:  BP (!) 152/90 (BP Location: Left Arm, Patient Position: Sitting, Cuff Size: Normal)   Pulse 87   Temp 98.4 F (36.9 C) (Oral)   Resp 16   Ht 5\' 8"  (1.727 m)   Wt 167 lb (75.8 kg)   SpO2 97%   BMI 25.39 kg/m   BP Readings from Last 3 Encounters:  01/21/20 (!) 152/90  12/27/19 (!) 149/89  11/28/19 (!) 146/88    Wt Readings from Last 3 Encounters:  01/21/20 167 lb (75.8 kg)  12/27/19 163 lb 9.6 oz (74.2 kg)  11/28/19 161 lb 9.6 oz (73.3 kg)    Physical Exam Vitals reviewed.  Constitutional:      Appearance: Normal appearance.  HENT:     Nose: Nose normal.     Mouth/Throat:     Mouth: Mucous membranes are moist.  Eyes:     General:  No scleral icterus.    Extraocular Movements: Extraocular movements intact.     Conjunctiva/sclera: Conjunctivae normal.     Pupils: Pupils are equal, round, and reactive to light.  Cardiovascular:     Rate and Rhythm: Normal rate and regular rhythm.     Heart sounds: No murmur heard.   Pulmonary:     Effort: Pulmonary effort is normal.     Breath sounds: No stridor. No wheezing, rhonchi or rales.  Abdominal:     General: Abdomen is flat.     Palpations: There is no mass.     Tenderness: There is  no abdominal tenderness. There is no guarding.     Hernia: No hernia is present.  Musculoskeletal:        General: Normal range of motion.     Cervical back: Neck supple.     Right lower leg: No edema.     Left lower leg: No edema.  Lymphadenopathy:     Cervical: No cervical adenopathy.  Skin:    General: Skin is warm and dry.  Neurological:     General: No focal deficit present.     Mental Status: She is alert.  Psychiatric:        Mood and Affect: Mood normal.        Behavior: Behavior normal.     Lab Results  Component Value Date   WBC 7.6 10/22/2019   HGB 12.3 10/22/2019   HCT 36.4 10/22/2019   PLT 238.0 10/22/2019   GLUCOSE 95 11/28/2019   CHOL 159 03/18/2019   TRIG 136.0 03/18/2019   HDL 81.40 03/18/2019   LDLDIRECT 60.0 12/13/2017   LDLCALC 50 03/18/2019   ALT 19 10/22/2019   AST 21 10/22/2019   NA 141 11/28/2019   K 3.8 11/28/2019   CL 103 11/28/2019   CREATININE 1.10 11/28/2019   BUN 24 (H) 11/28/2019   CO2 31 11/28/2019   TSH 3.39 11/28/2019   INR 0.90 10/14/2009   HGBA1C 5.7 10/22/2019    US RENAL  Result Date: 11/20/2019 CLINICAL DATA:  Initial evaluation for chronic kidney disease, stage III. EXAM: RENAL / URINARY TRACT ULTRASOUND COMPLETE COMPARISON:  Prior ultrasound from 03/20/2018. FINDINGS: Right Kidney: Renal measurements: 8.5 x 4.3 x 3.9 cm = volume: 74 mL. Renal echogenicity within normal limits. No nephrolithiasis or hydronephrosis. No focal renal mass. Left Kidney: Renal measurements: 9.0 x 5.4 x 4.0 cm = volume: 101 mL. Renal echogenicity within normal limits. No nephrolithiasis or hydronephrosis. No focal renal mass. Bladder: Appears normal for degree of bladder distention. Other: None. IMPRESSION: Normal renal ultrasound. Renal echogenicity within normal limits. No hydronephrosis. Electronically Signed   By: Jeannine Boga M.D.   On: 11/20/2019 20:52    Assessment & Plan:   Korbyn was seen today for hypertension.  Diagnoses and all  orders for this visit:  Essential hypertension- Her blood pressure is not adequately well controlled.  I have asked her to avoid NSAIDs.  She will restart the ARB. -     olmesartan (BENICAR) 20 MG tablet; Take 1 tablet (20 mg total) by mouth daily.  Migraine with aura and without status migrainosus, not intractable -     Ubrogepant (UBRELVY) 50 MG TABS; Take 1 tablet by mouth daily as needed.   I am having Jamie Villa start on Ubrelvy and olmesartan. I am also having her maintain her meclizine, Cholecalciferol, rosuvastatin, and furosemide.  Meds ordered this encounter  Medications  . Ubrogepant (UBRELVY) 50 MG TABS  Sig: Take 1 tablet by mouth daily as needed.    Dispense:  7 tablet    Refill:  0  . olmesartan (BENICAR) 20 MG tablet    Sig: Take 1 tablet (20 mg total) by mouth daily.    Dispense:  90 tablet    Refill:  1     Follow-up: Return in about 6 months (around 07/23/2020).  Scarlette Calico, MD

## 2020-01-22 MED ORDER — OLMESARTAN MEDOXOMIL 20 MG PO TABS: 20.0000 mg | ORAL_TABLET | Freq: Every day | ORAL | 1 refills | Status: DC

## 2020-02-12 ENCOUNTER — Ambulatory Visit: Payer: Medicare Other | Admitting: Neurology

## 2020-02-14 ENCOUNTER — Telehealth: Payer: Self-pay | Admitting: Internal Medicine

## 2020-02-14 DIAGNOSIS — I10 Essential (primary) hypertension: Secondary | ICD-10-CM

## 2020-02-14 NOTE — Progress Notes (Signed)
  Chronic Care Management   Note  02/14/2020 Name: MERNA BALDI MRN: [id number] DOB: April 18, 1946  QUETZALY EBNER is a 74 y.o. year old female who is a primary care patient of [name], MD. I reached out to [name] by phone today in response to a referral sent by Ms. Jomarie Longs Jabbour's PCP, [name], MD.   Ms. Waldroup was given information about Chronic Care Management services today including:  1. CCM service includes personalized support from designated clinical staff supervised by her physician, including individualized plan of care and coordination with other care providers 2. 24/7 contact phone numbers for assistance for urgent and routine care needs. 3. Service will only be billed when office clinical staff spend 20 minutes or more in a month to coordinate care. 4. Only one practitioner may furnish and bill the service in a calendar month. 5. The patient may stop CCM services at any time (effective at the end of the month) by phone call to the office staff.   Patient agreed to services and verbal consent obtained.   Follow up plan:   Earney [facility] [employer] Scheduler

## 2020-02-19 DEATH — deceased

## 2020-03-02 ENCOUNTER — Encounter: Payer: Self-pay | Admitting: Internal Medicine

## 2020-03-02 ENCOUNTER — Ambulatory Visit (INDEPENDENT_AMBULATORY_CARE_PROVIDER_SITE_OTHER): Payer: Medicare Other | Admitting: Internal Medicine

## 2020-03-02 ENCOUNTER — Other Ambulatory Visit: Payer: Self-pay

## 2020-03-02 VITALS — BP 114/56 | HR 72 | Temp 98.0°F | Resp 16 | Ht 68.0 in | Wt 159.0 lb

## 2020-03-02 DIAGNOSIS — I10 Essential (primary) hypertension: Secondary | ICD-10-CM

## 2020-03-02 DIAGNOSIS — E785 Hyperlipidemia, unspecified: Secondary | ICD-10-CM

## 2020-03-02 DIAGNOSIS — G43109 Migraine with aura, not intractable, without status migrainosus: Secondary | ICD-10-CM

## 2020-03-02 MED ORDER — UBRELVY 50 MG PO TABS: 1.0000 | ORAL_TABLET | Freq: Every day | ORAL | 1 refills | Status: DC | PRN

## 2020-03-02 MED ORDER — ROSUVASTATIN CALCIUM 10 MG PO TABS: 10.0000 mg | ORAL_TABLET | Freq: Every day | ORAL | 1 refills | Status: DC

## 2020-03-02 NOTE — Progress Notes (Signed)
Subjective:  Patient ID: Jamie Villa, female    DOB: 10/17/1945  Age: 74 y.o. MRN: [id number]  CC: Hypertension  This visit occurred during the SARS-CoV-2 public health emergency.  Safety protocols were in place, including screening questions prior to the visit, additional usage of staff PPE, and extensive cleaning of exam room while observing appropriate contact time as indicated for disinfecting solutions.    HPI IMOGINE CARVELL presents for f/up - She has had some recent episodes of dizziness and lightheadedness.  She tells me her blood pressure has been well controlled.  She has a headache about once a week and she says it responds well to [place].  Outpatient Medications Prior to Visit  Medication Sig Dispense Refill  . Cholecalciferol 50 MCG (2000 UT) TABS Take 1 tablet (2,000 Units total) by mouth daily. 90 tablet 1  . meclizine (ANTIVERT) 25 MG tablet Take 1 tablet (25 mg total) by mouth 3 (three) times daily as needed for dizziness. 65 tablet 2  . olmesartan (BENICAR) 20 MG tablet Take 1 tablet (20 mg total) by mouth daily. 90 tablet 1  . furosemide (LASIX) 20 MG tablet Take 40 mg by mouth daily.     . rosuvastatin (CRESTOR) 10 MG tablet TAKE 1 TABLET(10 MG) BY MOUTH DAILY 90 tablet 1  . Ubrogepant (UBRELVY) 50 MG TABS Take 1 tablet by mouth daily as needed. 7 tablet 0   No facility-administered medications prior to visit.    ROS Review of Systems  Constitutional: Negative for diaphoresis, fatigue and unexpected weight change.  HENT: Negative.   Eyes: Negative for visual disturbance.  Respiratory: Negative for cough, chest tightness, shortness of breath and wheezing.   Cardiovascular: Negative for chest pain, palpitations and leg swelling.  Gastrointestinal: Negative for abdominal pain, diarrhea, nausea and vomiting.  Endocrine: Negative.   Genitourinary: Negative.  Negative for difficulty urinating.  Musculoskeletal: Negative.   Skin: Negative.   Neurological: Positive  for dizziness, light-headedness and headaches. Negative for weakness.  Hematological: Negative for adenopathy. Does not bruise/bleed easily.  Psychiatric/Behavioral: Negative.     Objective:  BP (!) 114/56   Pulse 72   Temp 98 F (36.7 C) (Oral)   Resp 16   Ht 5\' 8"  (1.727 m)   Wt 159 lb (72.1 kg)   SpO2 97%   BMI 24.18 kg/m   BP Readings from Last 3 Encounters:  03/02/20 (!) 114/56  01/21/20 (!) 152/90  12/27/19 (!) 149/89    Wt Readings from Last 3 Encounters:  03/02/20 159 lb (72.1 kg)  01/21/20 167 lb (75.8 kg)  12/27/19 163 lb 9.6 oz (74.2 kg)    Physical Exam Vitals reviewed.  HENT:     Nose: Nose normal.     Mouth/Throat:     Mouth: Mucous membranes are moist.  Eyes:     General: No scleral icterus.    Conjunctiva/sclera: Conjunctivae normal.  Cardiovascular:     Rate and Rhythm: Normal rate and regular rhythm.     Heart sounds: No murmur heard.   Pulmonary:     Effort: Pulmonary effort is normal.     Breath sounds: No wheezing, rhonchi or rales.  Abdominal:     General: Abdomen is flat.     Palpations: There is no mass.     Tenderness: There is no abdominal tenderness.  Musculoskeletal:        General: Normal range of motion.     Cervical back: Neck supple.     Right  lower leg: No edema.     Left lower leg: No edema.  Lymphadenopathy:     Cervical: No cervical adenopathy.  Skin:    General: Skin is warm and dry.     Coloration: Skin is not pale.  Neurological:     General: No focal deficit present.     Mental Status: She is alert.  Psychiatric:        Mood and Affect: Mood normal.        Behavior: Behavior normal.     Lab Results  Component Value Date   WBC 7.6 10/22/2019   HGB 12.3 10/22/2019   HCT 36.4 10/22/2019   PLT 238.0 10/22/2019   GLUCOSE 95 11/28/2019   CHOL 159 03/18/2019   TRIG 136.0 03/18/2019   HDL 81.40 03/18/2019   LDLDIRECT 60.0 12/13/2017   LDLCALC 50 03/18/2019   ALT 19 10/22/2019   AST 21 10/22/2019   NA 141  11/28/2019   K 3.8 11/28/2019   CL 103 11/28/2019   CREATININE 1.10 11/28/2019   BUN 24 (H) 11/28/2019   CO2 31 11/28/2019   TSH 3.39 11/28/2019   INR 0.90 10/14/2009   HGBA1C 5.7 10/22/2019    US RENAL  Result Date: 11/20/2019 CLINICAL DATA:  Initial evaluation for chronic kidney disease, stage III. EXAM: RENAL / URINARY TRACT ULTRASOUND COMPLETE COMPARISON:  Prior ultrasound from 03/20/2018. FINDINGS: Right Kidney: Renal measurements: 8.5 x 4.3 x 3.9 cm = volume: 74 mL. Renal echogenicity within normal limits. No nephrolithiasis or hydronephrosis. No focal renal mass. Left Kidney: Renal measurements: 9.0 x 5.4 x 4.0 cm = volume: 101 mL. Renal echogenicity within normal limits. No nephrolithiasis or hydronephrosis. No focal renal mass. Bladder: Appears normal for degree of bladder distention. Other: None. IMPRESSION: Normal renal ultrasound. Renal echogenicity within normal limits. No hydronephrosis. Electronically Signed   By: Jeannine Boga M.D.   On: 11/20/2019 20:52    Assessment & Plan:   Sanjana was seen today for hypertension.  Diagnoses and all orders for this visit:  Essential hypertension- Her blood pressure is mildly overcontrolled and she is symptomatic.  I recommended that she stop taking the loop diuretic but to stay on the current dose of the ARB.  Migraine with aura and without status migrainosus, not intractable -     Ubrogepant (UBRELVY) 50 MG TABS; Take 1 tablet by mouth daily as needed.  Dyslipidemia, goal LDL below 100 -     rosuvastatin (CRESTOR) 10 MG tablet; Take 1 tablet (10 mg total) by mouth daily.   I have discontinued Jomarie Longs. Matthies's furosemide. I have also changed her rosuvastatin. Additionally, I am having her maintain her meclizine, Cholecalciferol, olmesartan, and Ubrelvy.  Meds ordered this encounter  Medications  . Ubrogepant (UBRELVY) 50 MG TABS    Sig: Take 1 tablet by mouth daily as needed.    Dispense:  12 tablet    Refill:  1  .  rosuvastatin (CRESTOR) 10 MG tablet    Sig: Take 1 tablet (10 mg total) by mouth daily.    Dispense:  90 tablet    Refill:  1     Follow-up: Return in about 6 months (around 08/30/2020).  Scarlette Calico, MD

## 2020-03-02 NOTE — Patient Instructions (Signed)

## 2020-03-06 ENCOUNTER — Other Ambulatory Visit: Payer: Self-pay | Admitting: Internal Medicine

## 2020-03-06 DIAGNOSIS — I10 Essential (primary) hypertension: Secondary | ICD-10-CM

## 2020-03-06 DIAGNOSIS — E876 Hypokalemia: Secondary | ICD-10-CM

## 2020-03-08 ENCOUNTER — Other Ambulatory Visit: Payer: Self-pay | Admitting: Internal Medicine

## 2020-03-08 DIAGNOSIS — I10 Essential (primary) hypertension: Secondary | ICD-10-CM

## 2020-03-09 ENCOUNTER — Telehealth: Payer: Self-pay | Admitting: Internal Medicine

## 2020-03-09 DIAGNOSIS — I10 Essential (primary) hypertension: Secondary | ICD-10-CM

## 2020-03-09 NOTE — Telephone Encounter (Signed)
I have called the pt to discuss the diuretic. She stated that she was not informed of the change during her visit and she would like to be given the medication again. I reviewed the PCPs last OV note from 9/16 where he stated she presented with lightheadedness and dizziness. The patient denied having either and would like to understand his rationale. She was informed that PCP is out of the office this week but she would like for another physician to review the chart and send the medication back in stating "I do not feel like myself being off of the diuretic". She said she complimented the PCP at her visit stating that she felt the best she ever has and not she doesn't feel like herself since the medication change that she was not aware of. Please advise in Dr. Ronnald Ramp absence.

## 2020-03-09 NOTE — Telephone Encounter (Signed)
Patient doesn't know whys she was taken off this medication  spironolactone (ALDACTONE) 25 MG tablet [Pharmacy Med Name: SPIRONOLACTONE 25MG  TABLETS] [[id number]]    She has other medication questions also. Please contact the patient  [facility], [address] AT [address] [place] Phone:  [phone]  Fax:  [phone]

## 2020-03-09 NOTE — Telephone Encounter (Signed)
I reviewed her last 2 visits with Dr. Ronnald Ramp.  It looks like she was not on spironolactone but furosemide which is also a water pill.  At her last visit with Dr. Ronnald Ramp he was concerned her blood pressure was over controlled and that she was having some symptoms and that is the reason for discontinuing the furosemide.  It looks like the Benicar was added in August for her blood pressure.   It looks like she was on furosemide 40 mg ( two 20 mg pills) daily.  If she wants we can restart furosemide may be at a lower dose of only 20 mg a day and then she can follow-up with Dr. Ronnald Ramp and go from there.

## 2020-03-10 MED ORDER — OLMESARTAN MEDOXOMIL 20 MG PO TABS: ORAL_TABLET | ORAL | 1 refills | Status: DC

## 2020-03-10 NOTE — Telephone Encounter (Signed)
Called pt, LVM.   

## 2020-03-10 NOTE — Addendum Note (Signed)
Addended by: Hinda Kehr on: 03/10/2020 09:07 AM   Modules accepted: Orders

## 2020-03-10 NOTE — Telephone Encounter (Signed)
Pt has been informed and stated that her medications has been "all messed up since May" and stated that she does not want to take furosemide. She would like to continue the spirolactone stating she still has a whole bottle of it. I offered to make an OV for her to come in to discuss since she had additional questions. She will see Dr. Ronnald Ramp on 10/5 since that is his earliest OV available.   She stated the pharmacy did not received the script of Benicar sent in on 9/19 so I have resubmitted that.

## 2020-03-24 ENCOUNTER — Ambulatory Visit: Payer: Medicare Other | Admitting: Internal Medicine

## 2020-03-30 NOTE — Addendum Note (Signed)
Addended by: Hinda Kehr on: 03/30/2020 10:42 AM   Modules accepted: Orders

## 2020-04-01 ENCOUNTER — Other Ambulatory Visit: Payer: Self-pay

## 2020-04-01 ENCOUNTER — Ambulatory Visit: Payer: Medicare Other | Admitting: Pharmacist

## 2020-04-01 DIAGNOSIS — I10 Essential (primary) hypertension: Secondary | ICD-10-CM

## 2020-04-01 DIAGNOSIS — E785 Hyperlipidemia, unspecified: Secondary | ICD-10-CM

## 2020-04-01 DIAGNOSIS — G43109 Migraine with aura, not intractable, without status migrainosus: Secondary | ICD-10-CM

## 2020-04-01 NOTE — Patient Instructions (Addendum)
Visit Information  Phone number for Pharmacist: [phone]  Thank you for meeting with me to discuss your medications! I look forward to working with you to achieve your health care goals. Below is a summary of what we talked about during the visit:  Goals Addressed            This Visit's Progress   . Pharmacy Care Plan       CARE PLAN ENTRY (see longitudinal plan of care for additional care plan information)  Current Barriers:  . Chronic Disease Management support, education, and care coordination needs related to Hypertension and Hyperlipidemia   Hypertension BP Readings from Last 3 Encounters:  03/02/20 (!) 114/56  01/21/20 (!) 152/90  12/27/19 (!) 149/89 .  Pharmacist Clinical Goal(s): o Over the next 90 days, patient will work with PharmD and providers to maintain BP goal <130/80 . Current regimen:  o Olmesartan 20 mg daily o Furosemide 20 mg daily . Interventions: o Discussed BP goals and benefits of medications for prevention of heart attack / stroke . Patient self care activities - Over the next 90 days, patient will: o Check BP weekly, document, and provide at future appointments o Ensure daily salt intake < 2300 mg/day  Hyperlipidemia Lab Results  Component Value Date/Time   LDLCALC 50 03/18/2019 01:54 PM   LDLDIRECT 60.0 12/13/2017 03:40 PM .  Pharmacist Clinical Goal(s): o Over the next 90 days, patient will work with PharmD and providers to maintain LDL goal < 100 . Current regimen:  o Rosuvastatin 10 mg daily . Interventions: o Discussed cholesterol goals and benefits of medications for prevention of heart attack / stroke . Patient self care activities - Over the next 90 days, patient will: o Continue current medication  Migraine . Pharmacist Clinical Goal(s) o Over the next 90 days, patient will work with PharmD and providers to optimize therapy . Current regimen:  o Ubrelvy 50 mg as needed . Interventions: o Discussed benefits of Ubrelvy and  proper timing/administration of medication at first sign of aura/migraine o Contacted pharmacy to check price, it requires a PA for 16 tab/30 days. Submitting PA via covermymeds . Patient self care activities - Over the next 90 days, patient will: o Continue medication as prescribed o Contact provider for more Ubrelvy samples  Medication management . Pharmacist Clinical Goal(s): o Over the next 90 days, patient will work with PharmD and providers to maintain optimal medication adherence . Current pharmacy: [facility] . Interventions o Comprehensive medication review performed. o Continue current medication management strategy . Patient self care activities - Over the next 90 days, patient will: o Focus on medication adherence by fill date o Take medications as prescribed o Report any questions or concerns to PharmD and/or provider(s)  Initial goal documentation      Jamie Villa was given information about Chronic Care Management services today including:  1. CCM service includes personalized support from designated clinical staff supervised by her physician, including individualized plan of care and coordination with other care providers 2. 24/7 contact phone numbers for assistance for urgent and routine care needs. 3. Standard insurance, coinsurance, copays and deductibles apply for chronic care management only during months in which we provide at least 20 minutes of these services. Most insurances cover these services at 100%, however patients may be responsible for any copay, coinsurance and/or deductible if applicable. This service may help you avoid the need for more expensive face-to-face services. 4. Only one practitioner may furnish and bill the service  in a calendar month. 5. The patient may stop CCM services at any time (effective at the end of the month) by phone call to the office staff.  Patient agreed to services and verbal consent obtained.   Patient verbalizes  understanding of instructions provided today.  Telephone follow up appointment with pharmacy team member scheduled for: 3 months  Charlene Brooke, PharmD, BCACP Clinical Pharmacist [facility] After Age 33 After age 8, you are at a higher risk for certain long-term diseases and infections as well as injuries from falls. Falls are a major cause of broken bones and head injuries in people who are older than age 88. Getting regular preventive care can help to keep you healthy and well. Preventive care includes getting regular testing and making lifestyle changes as recommended by your health care provider. Talk with your health care provider about:  Which screenings and tests you should have. A screening is a test that checks for a disease when you have no symptoms.  A diet and exercise plan that is right for you. What should I know about screenings and tests to prevent falls? Screening and testing are the best ways to find a health problem early. Early diagnosis and treatment give you the best chance of managing medical conditions that are common after age 54. Certain conditions and lifestyle choices may make you more likely to have a fall. Your health care provider may recommend:  Regular vision checks. Poor vision and conditions such as cataracts can make you more likely to have a fall. If you wear glasses, make sure to get your prescription updated if your vision changes.  Medicine review. Work with your health care provider to regularly review all of the medicines you are taking, including over-the-counter medicines. Ask your health care provider about any side effects that may make you more likely to have a fall. Tell your health care provider if any medicines that you take make you feel dizzy or sleepy.  Osteoporosis screening. Osteoporosis is a condition that causes the bones to get weaker. This can make the bones weak and cause  them to break more easily.  Blood pressure screening. Blood pressure changes and medicines to control blood pressure can make you feel dizzy.  Strength and balance checks. Your health care provider may recommend certain tests to check your strength and balance while standing, walking, or changing positions.  Foot health exam. Foot pain and numbness, as well as not wearing proper footwear, can make you more likely to have a fall.  Depression screening. You may be more likely to have a fall if you have a fear of falling, feel emotionally low, or feel unable to do activities that you used to do.  Alcohol use screening. Using too much alcohol can affect your balance and may make you more likely to have a fall. What actions can I take to lower my risk of falls? General instructions  Talk with your health care provider about your risks for falling. Tell your health care provider if: ? You fall. Be sure to tell your health care provider about all falls, even ones that seem minor. ? You feel dizzy, sleepy, or off-balance.  Take over-the-counter and prescription medicines only as told by your health care provider. These include any supplements.  Eat a healthy diet and maintain a healthy weight. A healthy diet includes low-fat dairy products, low-fat (lean) meats, and fiber from whole grains, beans, and lots of  fruits and vegetables. Home safety  Remove any tripping hazards, such as rugs, cords, and clutter.  Install safety equipment such as grab bars in bathrooms and safety rails on stairs.  Keep rooms and walkways well-lit. Activity   Follow a regular exercise program to stay fit. This will help you maintain your balance. Ask your health care provider what types of exercise are appropriate for you.  If you need a cane or walker, use it as recommended by your health care provider.  Wear supportive shoes that have nonskid soles. Lifestyle  Do not drink alcohol if your health care provider  tells you not to drink.  If you drink alcohol, limit how much you have: ? 0-1 drink a day for women. ? 0-2 drinks a day for men.  Be aware of how much alcohol is in your drink. In the [place] one drink equals one typical bottle of beer (12 oz), one-half glass of wine (5 oz), or one shot of hard liquor (1 oz).  Do not use any products that contain nicotine or tobacco, such as cigarettes and e-cigarettes. If you need help quitting, ask your health care provider. Summary  Having a healthy lifestyle and getting preventive care can help to protect your health and wellness after age 25.  Screening and testing are the best way to find a health problem early and help you avoid having a fall. Early diagnosis and treatment give you the best chance for managing medical conditions that are more common for people who are older than age 10.  Falls are a major cause of broken bones and head injuries in people who are older than age 55. Take precautions to prevent a fall at home.  Work with your health care provider to learn what changes you can make to improve your health and wellness and to prevent falls. This information is not intended to replace advice given to you by your health care provider. Make sure you discuss any questions you have with your health care provider. Document Revised: 09/27/2018 Document Reviewed: 04/19/2017 Elsevier Patient Education  2020 [employer].

## 2020-04-01 NOTE — Chronic Care Management (AMB) (Signed)
Chronic Care Management Pharmacy  Name: Jamie Villa  MRN: [id number] DOB: [birth year]   Chief Complaint/ HPI  Jamie Villa,  74 y.o. , female presents for their Initial CCM visit with the clinical pharmacist via telephone due to COVID-19 Pandemic.  PCP : [name], MD Patient Care Team: [name], MD as PCP - Elwin Sleight, MD as Referring Physician (Internal Medicine) Charlton Haws, [facility] as Pharmacist (Pharmacist)  Their chronic conditions include: Hypertension, Hyperlipidemia and Chronic Kidney Disease, Migraine, Vitamin D deficiency, Hx Lymphoma, Depression  Pt reports she lives with her 2nd husband now. Her first husband of 76 years died 28 years ago. Her son has not spoken to her since she got remarried which causes her a lot of stress.  Office Visits: 03/02/20 Dr Ronnald Ramp OV: BP overcontrolled, pt c/o dizziness, advised to stop furosemide and continue olmesartan.  01/21/20 Dr Ronnald Ramp OV: BP high, restart olmesartan 20 mg. rx'd Roselyn Meier for migraine.  11/28/19 Dr Ronnald Ramp OV: BP controlled on no meds. No changes.  10/22/19 Dr Ronnald Ramp OV: BP overcontrolled w/ hyperkalemia, stopped spironolactone and ARB. Referred to nephrology due to decline in renal function. Referred to neurology for numbness/tingling in feet.  Consult Visit: 12/27/19 Dr Tomi Likens (neurology): evaluation of "spells", most c/w with migraine w/ aura. Pt declines preventative.   11/05/19 Dr Carolin Sicks (nephrology): initial visit for CKD stage IV. 11/04/19 Dr Eugenio Hoes [facility] heme/onc): f/u for hx of lymphoma, BMT.  Allergies  Allergen Reactions  . Codeine Other (See Comments)    Unknown  . Penicillins Hives and Other (See Comments)    DID THE REACTION INVOLVE: Swelling of the face/tongue/throat, SOB, or low BP? Unknown Sudden or severe rash/hives, skin peeling, or the inside of the mouth or nose? Unknown Did it require medical treatment? Unknown When did it last happen? Told all her life not to take If all  above answers are "NO", may proceed with cephalosporin use.    Medications: Outpatient Encounter Medications as of 04/01/2020  Medication Sig  . Cholecalciferol 50 MCG (2000 UT) TABS Take 1 tablet (2,000 Units total) by mouth daily.  . furosemide (LASIX) 20 MG tablet Take 20 mg by mouth daily.  . meclizine (ANTIVERT) 25 MG tablet Take 1 tablet (25 mg total) by mouth 3 (three) times daily as needed for dizziness.  Marland Kitchen olmesartan (BENICAR) 20 MG tablet TAKE 1 TABLET(20 MG) BY MOUTH DAILY  . rosuvastatin (CRESTOR) 10 MG tablet Take 1 tablet (10 mg total) by mouth daily.  Marland Kitchen Ubrogepant (UBRELVY) 50 MG TABS Take 1 tablet by mouth daily as needed.  . [DISCONTINUED] olmesartan (BENICAR) 20 MG tablet Take 1 tablet (20 mg total) by mouth daily.  . [DISCONTINUED] rosuvastatin (CRESTOR) 10 MG tablet Take 1 tablet (10 mg total) by mouth daily.   No facility-administered encounter medications on file as of 04/01/2020.    Wt Readings from Last 3 Encounters:  03/02/20 159 lb (72.1 kg)  01/21/20 167 lb (75.8 kg)  12/27/19 163 lb 9.6 oz (74.2 kg)    Current Diagnosis/Assessment:  SDOH Interventions     Most Recent Value  SDOH Interventions  Financial Strain Interventions Intervention Not Indicated      Goals Addressed            This Visit's Progress   . Pharmacy Care Plan       CARE PLAN ENTRY (see longitudinal plan of care for additional care plan information)  Current Barriers:  . Chronic Disease Management support,  education, and care coordination needs related to Hypertension and Hyperlipidemia   Hypertension BP Readings from Last 3 Encounters:  03/02/20 (!) 114/56  01/21/20 (!) 152/90  12/27/19 (!) 149/89 .  Pharmacist Clinical Goal(s): o Over the next 90 days, patient will work with PharmD and providers to maintain BP goal <130/80 . Current regimen:  o Olmesartan 20 mg daily o Furosemide 20 mg daily . Interventions: o Discussed BP goals and benefits of medications for  prevention of heart attack / stroke . Patient self care activities - Over the next 90 days, patient will: o Check BP weekly, document, and provide at future appointments o Ensure daily salt intake < 2300 mg/day  Hyperlipidemia Lab Results  Component Value Date/Time   LDLCALC 50 03/18/2019 01:54 PM   LDLDIRECT 60.0 12/13/2017 03:40 PM .  Pharmacist Clinical Goal(s): o Over the next 90 days, patient will work with PharmD and providers to maintain LDL goal < 100 . Current regimen:  o Rosuvastatin 10 mg daily . Interventions: o Discussed cholesterol goals and benefits of medications for prevention of heart attack / stroke . Patient self care activities - Over the next 90 days, patient will: o Continue current medication  Migraine . Pharmacist Clinical Goal(s) o Over the next 90 days, patient will work with PharmD and providers to optimize therapy . Current regimen:  o Ubrelvy 50 mg as needed . Interventions: o Discussed benefits of Ubrelvy and proper timing/administration of medication at first sign of aura/migraine o Contacted pharmacy to check price, it requires a PA for 16 tab/30 days. Submitting PA via covermymeds . Patient self care activities - Over the next 90 days, patient will: o Continue medication as prescribed o Contact provider for more Ubrelvy samples  Medication management . Pharmacist Clinical Goal(s): o Over the next 90 days, patient will work with PharmD and providers to maintain optimal medication adherence . Current pharmacy: [facility] . Interventions o Comprehensive medication review performed. o Continue current medication management strategy . Patient self care activities - Over the next 90 days, patient will: o Focus on medication adherence by fill date o Take medications as prescribed o Report any questions or concerns to PharmD and/or provider(s)  Initial goal documentation       Hypertension   BP goal is:  <130/80  Office blood pressures are   BP Readings from Last 3 Encounters:  03/02/20 (!) 114/56  01/21/20 (!) 152/90  12/27/19 (!) 149/89   Kidney Function Lab Results  Component Value Date/Time   CREATININE 1.10 11/28/2019 03:06 PM   CREATININE 1.92 (H) 10/22/2019 04:31 PM   GFR 48.33 (L) 11/28/2019 03:06 PM   GFRNONAA 57 (L) 07/05/2018 02:27 PM   GFRAA >60 07/05/2018 02:27 PM   K 3.8 11/28/2019 03:06 PM   K 5.2 No hemolysis seen (H) 10/22/2019 04:31 PM   Patient checks BP at home infrequently Patient home BP readings are ranging: n/a  Patient has failed these meds in the past: carvedilol, losartan-HCTZ, Bystolic, spironolactone Patient is currently controlled on the following medications:  . Olmesartan 20 mg daily . Furosemide 20 mg daily   We discussed diet and exercise extensively; spironolactone was stopped in May due to hyperkalemia; pt feels very good on current regimen and does not want to change; denies issues or side effects  Plan  Continue current medications   Hyperlipidemia   LDL goal < 100  Lipid Panel     Component Value Date/Time   CHOL 159 03/18/2019 1354   TRIG  136.0 03/18/2019 1354   HDL 81.40 03/18/2019 1354   LDLCALC 50 03/18/2019 1354   LDLDIRECT 60.0 12/13/2017 1540    Hepatic Function Latest Ref Rng & Units 10/22/2019 04/09/2018 03/12/2018  Total Protein 6.0 - 8.3 g/dL 7.2 7.9 6.9  Albumin 3.5 - 5.2 g/dL 4.5 4.4 4.1  AST 0 - 37 U/L 21 19 16   ALT 0 - 35 U/L 19 17 18   Alk Phosphatase 39 - 117 U/L 83 68 57  Total Bilirubin 0.2 - 1.2 mg/dL 0.6 0.9 0.7  Bilirubin, Direct 0.0 - 0.3 mg/dL 0.1 - -     The 10-year ASCVD risk score Mikey Bussing DC Jr., et al., 2013) is: 19.1%   Values used to calculate the score:     Age: 85 years     Sex: Female     Is Non-Hispanic African American: No     Diabetic: No     Tobacco smoker: No     Systolic Blood Pressure: 389 mmHg     Is BP treated: Yes     HDL Cholesterol: 81.4 mg/dL     Total Cholesterol: 159 mg/dL   Patient has failed these meds in  past: n/a Patient is currently controlled on the following medications:  . Rosuvastatin 10 mg daily  We discussed:  diet and exercise extensively; Cholesterol goals; benefits of statin for ASCVD risk reduction  Plan  Continue current medications and control with diet and exercise  Migraine   Patient has failed these meds in past: n/a Patient is currently controlled on the following medications:  Marland Kitchen Ubrelvy 50 mg PRN  We discussed:  Pt has used 3 of 5 pills in her sample; she reports medication works perfectly, stops migraine at first sign; however her insurance does not cover it and it will be over $1000 to pay out of pocket. Contacted pharmacy for cost, per tech it just requires a PA - it is Tier 3, 16 tab/30 days.  Plan  Continue current medications  Submitting PA via covermymeds  Vitamin D deficiency   Lab Results  Component Value Date/Time   VD25OH 47.80 10/22/2019 04:31 PM   VD25OH 55.24 03/18/2019 01:54 PM   Patient has failed these meds in past: n/a Patient is currently controlled on the following medications:  Marland Kitchen Vitamin D 2000 IU daily  We discussed:  Patient is satisfied with current regimen and denies issues  Plan  Continue current medications  Vertigo   Patient is currently controlled on the following medications:  Marland Kitchen Meclizine 25 mg TID prn  We discussed:  Patient is satisfied with current regimen and denies issues  Plan  Continue current medications  Medication Management   Pt uses Rule for all medications Uses pill box? No - prefers bottles Pt endorses 100% compliance  We discussed: Current pharmacy is preferred with insurance plan and patient is satisfied with pharmacy services  Plan  Continue current medication management strategy    Follow up: 3 month phone visit  Charlene Brooke, PharmD, BCACP Clinical Pharmacist [facility] at [facility] [phone]

## 2020-04-06 ENCOUNTER — Telehealth: Payer: Self-pay | Admitting: Pharmacist

## 2020-04-06 NOTE — Progress Notes (Signed)
    Chronic Care Management Pharmacy Assistant   Name: Jamie Villa  MRN: [id number] DOB: Feb 07, 1946  Reason for Encounter: Pharmacy Call PCP : Jamie Lima, MD  Allergies:   Allergies  Allergen Reactions  . Codeine Other (See Comments)    Unknown  . Penicillins Hives and Other (See Comments)    DID THE REACTION INVOLVE: Swelling of the face/tongue/throat, SOB, or low BP? Unknown Sudden or severe rash/hives, skin peeling, or the inside of the mouth or nose? Unknown Did it require medical treatment? Unknown When did it last happen? Told all her life not to take If all above answers are "NO", may proceed with cephalosporin use.     Medications: Outpatient Encounter Medications as of 04/06/2020  Medication Sig  . Cholecalciferol 50 MCG (2000 UT) TABS Take 1 tablet (2,000 Units total) by mouth daily.  . furosemide (LASIX) 20 MG tablet Take 20 mg by mouth daily.  . meclizine (ANTIVERT) 25 MG tablet Take 1 tablet (25 mg total) by mouth 3 (three) times daily as needed for dizziness.  Marland Kitchen olmesartan (BENICAR) 20 MG tablet TAKE 1 TABLET(20 MG) BY MOUTH DAILY  . rosuvastatin (CRESTOR) 10 MG tablet Take 1 tablet (10 mg total) by mouth daily.  Marland Kitchen Ubrogepant (UBRELVY) 50 MG TABS Take 1 tablet by mouth daily as needed.  . [DISCONTINUED] olmesartan (BENICAR) 20 MG tablet Take 1 tablet (20 mg total) by mouth daily.  . [DISCONTINUED] rosuvastatin (CRESTOR) 10 MG tablet Take 1 tablet (10 mg total) by mouth daily.   No facility-administered encounter medications on file as of 04/06/2020.    Current Diagnosis: Patient Active Problem List   Diagnosis Date Noted  . Migraine with aura and without status migrainosus, not intractable 01/21/2020  . Hypothyroid   . Chronic renal disease, stage 3, moderately decreased glomerular filtration rate (GFR) between 30-59 mL/min/1.73 square meter ([place]) 03/18/2019  . Gallstones 07/12/2018  . Current moderate episode of major depressive disorder without prior  episode ([place]) 12/13/2017  . Vasculitis ([place]) 04/07/2016  . Diuretic-induced hypokalemia 05/21/2015  . Vitamin D deficiency 05/20/2015  . Visit for screening mammogram 06/04/2013  . Dyslipidemia, goal LDL below 100 06/04/2013  . Hyperglycemia 06/04/2013  . Non-Hodgkin lymphoma ([place]) 01/18/2012  . 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 Essential hypertension 03/30/2010    Goals Addressed   None     Follow-Up:  Pharmacist Review   Patient had a visit with the Clinical pharmacist Jamie Villa. After the visit Jamie Villa needed the patient's medication insurance coverage. I called [facility] and spoke with Jamie Villa the pharmacist to requested the patient's BIN [id number], Group ID- [id number] part D, Member ID [id number] and PIN [id number].    Jamie Villa, [facility]  Practice Team Manager/ CPA (Clinical Pharmacist Assistant) [phone]

## 2020-04-07 NOTE — Progress Notes (Addendum)
Completed prior authorization request for Ubrelvy via Covermymeds using patient's prescription insurance information. Awaiting plan response.  Key: [id number]

## 2020-04-16 NOTE — Progress Notes (Addendum)
Received fax from [facility]. Roselyn Meier PA was denied because insurance requires patient try another triptan prior to [place]. Letter sent to patient as well.

## 2020-05-18 NOTE — Telephone Encounter (Signed)
   Cover MyMeds calling for an update. Does patient still need prior auth for Ubrelvy  Please call , use ref key [id number]

## 2020-05-19 ENCOUNTER — Telehealth: Payer: Self-pay | Admitting: Internal Medicine

## 2020-05-19 NOTE — Telephone Encounter (Signed)
Faxed appeal to plan. Response expected in 5-7 business days.  Ref Key: [id number]

## 2020-05-19 NOTE — Telephone Encounter (Signed)
[employer] calling to make [place] aware that they received the appeal for the Ubrogepant (UBRELVY) 50 MG TABS and there is a 7 day turn around. [phone] call with any questions

## 2020-05-22 NOTE — Telephone Encounter (Signed)
[employer] called and wanted to let Dr. Ronnald Ramp know that the appeal was approved and the patient is able to get the medication.

## 2020-06-29 NOTE — Progress Notes (Signed)
NEUROLOGY FOLLOW UP OFFICE NOTE  Jamie Villa [id number]   Subjective:  Jamie Villa is a 74 year old woman with hypertension andhistory ofnon-Hodgkin's lymphoma who follows up for migraine with aura.  She is accompanied by her husband who supplements history.  UPDATE: She deferred medication for migraine at last visit.  She was prescribed Ubrelvy by her PCP which is helpful.  Tylenol helps as well.  No migraine since September.  Still reports stress.  HISTORY: She had these spells several years ago. She has history of non-Hodgkin's lymphoma in 2009 and recurrence in 2011, but these spells happened years before that. They recurred in 2017.  She has a "funny" feeling. She cannot elaborate any further than that. She reports that her vision becomes "fuzzy" or grayed out. She says she is aware during the episode and she is able to respond to other people, however she cannot remember afterwards. There is no associated headache, phantosmia, epigastric rising, deja vu, nausea, dizziness or palpitations. It lasts about 15 to 20 minutes. It only occurs about 15 to 20 minutes after she drinks regular coffee. There appears to be no correlation with her blood pressure or sugar. Since she had visual symptoms, she saw the ophthalmologist. She was evaluated by Dr. Kathlen Mody at [facility], who found no significant abnormalities on exam and suspected ocular migraines.  Routine EEG that November was normal.  Follow up 24 hour ambulatory EEG, which was normal, no seizure activity noted when she drank coffee, however she did not have her habitual spells.   The spells soon resolved.  However, they returned in early May 2021.  She is able to describe them now.  She reports onset of spinning psychedelic colors lasting 15 minutes followed by moderate to severe frontal throbbing headache lasting 1 to 2 hours.  It typically resolves with Tylenol and sleep.  She has had 6 in the past 30 days.  They do  not occur with coffee (she rarely drinks coffee now).  She does endorse increased emotional stress over the past year.  She does report remote history of headaches.She did have an MRI of the brain without contrast on 06/30/15 to evaluate dizziness at that time, which  was unremarkable.    PAST MEDICAL HISTORY: Past Medical History:  Diagnosis Date  . Cancer ([place])    Large B cell Lymphoma  . Complication of anesthesia    Difficult to arouse  . Hiatal hernia   . HTN (hypertension)   . Hx of colonic polyps   . Hyperlipidemia   . Hypothyroid   . Pneumonia    Healthcare-associated versus community-acquired pneumonia  . PONV (postoperative nausea and vomiting)     MEDICATIONS: Current Outpatient Medications on File Prior to Visit  Medication Sig Dispense Refill  . Cholecalciferol 50 MCG (2000 UT) TABS Take 1 tablet (2,000 Units total) by mouth daily. 90 tablet 1  . furosemide (LASIX) 20 MG tablet Take 20 mg by mouth daily.    . meclizine (ANTIVERT) 25 MG tablet Take 1 tablet (25 mg total) by mouth 3 (three) times daily as needed for dizziness. 65 tablet 2  . olmesartan (BENICAR) 20 MG tablet TAKE 1 TABLET(20 MG) BY MOUTH DAILY 90 tablet 1  . rosuvastatin (CRESTOR) 10 MG tablet Take 1 tablet (10 mg total) by mouth daily. 90 tablet 1  . Ubrogepant (UBRELVY) 50 MG TABS Take 1 tablet by mouth daily as needed. 12 tablet 1   No current facility-administered medications on file prior  to visit.    ALLERGIES: Allergies  Allergen Reactions  . Codeine Other (See Comments)    Unknown  . Penicillins Hives and Other (See Comments)    DID THE REACTION INVOLVE: Swelling of the face/tongue/throat, SOB, or low BP? Unknown Sudden or severe rash/hives, skin peeling, or the inside of the mouth or nose? Unknown Did it require medical treatment? Unknown When did it last happen? Told all her life not to take If all above answers are "NO", may proceed with cephalosporin use.     FAMILY  HISTORY: Family History  Problem Relation Age of Onset  . Hypertension Other   . Coronary artery disease Neg Hx     SOCIAL HISTORY: Social History   Socioeconomic History  . Marital status: Married    Spouse name: Not on file  . Number of children: 2  . Years of education: Not on file  . Highest education level: Not on file  Occupational History  . 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 Smoking status: Never Smoker  . Smokeless tobacco: Never Used  Vaping Use  . Vaping Use: Never used  Substance and Sexual Activity  . Alcohol use: Yes    Alcohol/week: 1.0 standard drink    Types: 1 Glasses of wine per week    Comment: 1 glass of wine several times weekly  . Drug use: No  . Sexual activity: Yes  Other Topics Concern  . Not on file  Social History Narrative   Married and currently lives with husband.   Social Determinants of Health   Financial Resource Strain: Low Risk   . Difficulty of Paying Living Expenses: Not very hard  Food Insecurity: Not on file  Transportation Needs: Not on file  Physical Activity: Not on file  Stress: Not on file  Social Connections: Not on file  Intimate Partner Violence: Not on file     Objective:  Blood pressure (!) 150/86, pulse 87, height 5\' 6"  (1.676 m), weight 162 lb 9.6 oz (73.8 kg), SpO2 94 %. General: No acute distress.  Patient appears well-groomed.   Head:  Normocephalic/atraumatic Eyes:  Fundi examined but not visualized Neck: supple, no paraspinal tenderness, full range of motion Heart:  Regular rate and rhythm Lungs:  Clear to auscultation bilaterally Back: No paraspinal tenderness Neurological Exam: alert and oriented to person, place, and time. Attention span and concentration intact, recent and remote memory intact, fund of knowledge intact.  Speech fluent and not dysarthric, language intact.  CN II-XII intact. Bulk and tone normal, muscle strength 5/5 throughout.  Sensation  to light touch, temperature and vibration intact.  Deep tendon reflexes 2+ throughout, toes downgoing.  Finger to nose and heel to shin testing intact.  Gait normal, Romberg negative.   Assessment/Plan:   Migraine with aura, without status migrainosus, not intractable.  Her description of her spells are most consistent with migraine.  1.  May use Tylenol or Ubrelvy as needed 2.  Follow up 6 months.  Metta Clines, DO  CC: Scarlette Calico, MD

## 2020-06-30 ENCOUNTER — Encounter: Payer: Self-pay | Admitting: Neurology

## 2020-06-30 ENCOUNTER — Ambulatory Visit (INDEPENDENT_AMBULATORY_CARE_PROVIDER_SITE_OTHER): Payer: Medicare Other | Admitting: Neurology

## 2020-06-30 ENCOUNTER — Other Ambulatory Visit: Payer: Self-pay

## 2020-06-30 VITALS — BP 150/86 | HR 87 | Ht 66.0 in | Wt 162.6 lb

## 2020-06-30 DIAGNOSIS — G43109 Migraine with aura, not intractable, without status migrainosus: Secondary | ICD-10-CM

## 2020-06-30 NOTE — Patient Instructions (Signed)
Limit use of pain relievers to no more than 2 days out of week to prevent risk of rebound or medication-overuse headache.  Follow up in 6 months

## 2020-07-01 DIAGNOSIS — Z8572 Personal history of non-Hodgkin lymphomas: Secondary | ICD-10-CM | POA: Diagnosis not present

## 2020-07-01 DIAGNOSIS — Z9481 Bone marrow transplant status: Secondary | ICD-10-CM | POA: Diagnosis not present

## 2020-07-02 ENCOUNTER — Ambulatory Visit: Payer: Medicare Other | Admitting: Pharmacist

## 2020-07-02 ENCOUNTER — Other Ambulatory Visit: Payer: Self-pay

## 2020-07-02 DIAGNOSIS — G43109 Migraine with aura, not intractable, without status migrainosus: Secondary | ICD-10-CM

## 2020-07-02 DIAGNOSIS — I10 Essential (primary) hypertension: Secondary | ICD-10-CM

## 2020-07-02 DIAGNOSIS — E785 Hyperlipidemia, unspecified: Secondary | ICD-10-CM

## 2020-07-02 NOTE — Patient Instructions (Signed)
Visit Information  Phone number for Pharmacist: [phone]  Goals Addressed            This Visit's Progress   . Pharmacy Care Plan       CARE PLAN ENTRY (see longitudinal plan of care for additional care plan information)  Current Barriers:  . Chronic Disease Management support, education, and care coordination needs related to Hypertension and Hyperlipidemia   Hypertension BP Readings from Last 3 Encounters:  06/30/20 (!) 150/86  03/02/20 (!) 114/56  01/21/20 (!) 152/90 .  Pharmacist Clinical Goal(s): o Over the next 180 days, patient will work with PharmD and providers to maintain BP goal <130/80 . Current regimen:  o Olmesartan 20 mg daily o Furosemide 20 mg daily . Interventions: o Discussed BP goals and benefits of medications for prevention of heart attack / stroke . Patient self care activities - Over the next 180 days, patient will: o Check BP weekly, document, and provide at future appointments o Ensure daily salt intake < 2300 mg/day  Hyperlipidemia Lab Results  Component Value Date/Time   LDLCALC 50 03/18/2019 01:54 PM   LDLDIRECT 60.0 12/13/2017 03:40 PM .  Pharmacist Clinical Goal(s): o Over the next 180 days, patient will work with PharmD and providers to maintain LDL goal < 100 . Current regimen:  o Rosuvastatin 10 mg daily . Interventions: o Discussed cholesterol goals and benefits of medications for prevention of heart attack / stroke . Patient self care activities - Over the next 180 days, patient will: o Continue current medication  Migraine . Pharmacist Clinical Goal(s) o Over the next 180 days, patient will work with PharmD and providers to optimize therapy . Current regimen:  o Ubrelvy 50 mg as needed . Interventions: o Discussed benefits of Ubrelvy and proper timing/administration of medication at first sign of aura/migraine Dorinda Hill PA approved November 2021 . Patient self care activities - Over the next 180 days, patient  will: o Continue medication as prescribed o Contact provider for more Ubrelvy samples  Medication management . Pharmacist Clinical Goal(s): o Over the next 180 days, patient will work with PharmD and providers to maintain optimal medication adherence . Current pharmacy: [facility] . Interventions o Comprehensive medication review performed. o Continue current medication management strategy . Patient self care activities - Over the next 180 days, patient will: o Focus on medication adherence by fill date o Take medications as prescribed o Report any questions or concerns to PharmD and/or provider(s)  Please see past updates related to this goal by clicking on the "Past Updates" button in the selected goal       There are no care plans to display for this patient.  The patient verbalized understanding of instructions, educational materials, and care plan provided today and declined offer to receive copy of patient instructions, educational materials, and care plan.  Telephone follow up appointment with pharmacy team member scheduled for: 6 months  Charlene Brooke, PharmD, [facility] Clinical Pharmacist [facility] [phone]

## 2020-07-02 NOTE — Chronic Care Management (AMB) (Signed)
Chronic Care Management Pharmacy  Name: Jamie Villa  MRN: [id number] DOB: 03-Nov-1945   Chief Complaint/ HPI  [name],  75 y.o. , female presents for their Follow-Up CCM visit with the clinical pharmacist via telephone due to COVID-19 Pandemic.  PCP : [name], MD Patient Care Team: [name], MD as PCP - Elwin Sleight, MD as Referring Physician (Internal Medicine) Charlton Haws, [facility] as Pharmacist (Pharmacist)  Their chronic conditions include: Hypertension, Hyperlipidemia and Chronic Kidney Disease, Migraine, Vitamin D deficiency, Hx Lymphoma, Depression  Pt reports she lives with her 2nd husband now. Her first husband of 50 years died 34 years ago. Her son has not spoken to her since she got remarried which causes her a lot of stress.  Office Visits: 03/02/20 Dr Ronnald Ramp OV: BP overcontrolled, pt c/o dizziness, advised to stop furosemide and continue olmesartan.  01/21/20 Dr Ronnald Ramp OV: BP high, restart olmesartan 20 mg. rx'd Roselyn Meier for migraine.  11/28/19 Dr Ronnald Ramp OV: BP controlled on no meds. No changes.  10/22/19 Dr Ronnald Ramp OV: BP overcontrolled w/ hyperkalemia, stopped spironolactone and ARB. Referred to nephrology due to decline in renal function. Referred to neurology for numbness/tingling in feet.  Consult Visit: 07/01/20 Dr Laverta Baltimore (BMT clinic [facility]): s/p BMT 11 years ago, stable. Changed Vitamin D to 50,000 IU weekly.  06/30/20 Dr Tomi Likens (neurology): f/u for migraine. Continue tylenol and Ubrelvy PRN. No med changes.  12/27/19 Dr Tomi Likens (neurology): evaluation of "spells", most c/w with migraine w/ aura. Pt declines preventative.   11/05/19 Dr Carolin Sicks (nephrology): initial visit for CKD stage IV. 11/04/19 Dr Eugenio Hoes [facility] heme/onc): f/u for hx of lymphoma, BMT.  Allergies  Allergen Reactions  . Codeine Other (See Comments)    Unknown  . Penicillins Hives and Other (See Comments)    DID THE REACTION INVOLVE: Swelling of the face/tongue/throat,  SOB, or low BP? Unknown Sudden or severe rash/hives, skin peeling, or the inside of the mouth or nose? Unknown Did it require medical treatment? Unknown When did it last happen? Told all her life not to take If all above answers are "NO", may proceed with cephalosporin use.    Medications: Outpatient Encounter Medications as of 07/02/2020  Medication Sig  . Cholecalciferol 50 MCG (2000 UT) TABS Take 1 tablet (2,000 Units total) by mouth daily.  . furosemide (LASIX) 20 MG tablet Take 20 mg by mouth daily.  . meclizine (ANTIVERT) 25 MG tablet Take 1 tablet (25 mg total) by mouth 3 (three) times daily as needed for dizziness.  Marland Kitchen olmesartan (BENICAR) 20 MG tablet TAKE 1 TABLET(20 MG) BY MOUTH DAILY  . rosuvastatin (CRESTOR) 10 MG tablet Take 1 tablet (10 mg total) by mouth daily.  Marland Kitchen Ubrogepant (UBRELVY) 50 MG TABS Take 1 tablet by mouth daily as needed.   No facility-administered encounter medications on file as of 07/02/2020.    Wt Readings from Last 3 Encounters:  06/30/20 162 lb 9.6 oz (73.8 kg)  03/02/20 159 lb (72.1 kg)  01/21/20 167 lb (75.8 kg)    Current Diagnosis/Assessment:    Goals Addressed            This Visit's Progress   . Pharmacy Care Plan       CARE PLAN ENTRY (see longitudinal plan of care for additional care plan information)  Current Barriers:  . Chronic Disease Management support, education, and care coordination needs related to Hypertension and Hyperlipidemia   Hypertension BP Readings from Last 3 Encounters:  06/30/20 (!) 150/86  03/02/20 (!) 114/56  01/21/20 (!) 152/90 .  Pharmacist Clinical Goal(s): o Over the next 180 days, patient will work with PharmD and providers to maintain BP goal <130/80 . Current regimen:  o Olmesartan 20 mg daily o Furosemide 20 mg daily . Interventions: o Discussed BP goals and benefits of medications for prevention of heart attack / stroke . Patient self care activities - Over the next 180 days, patient  will: o Check BP weekly, document, and provide at future appointments o Ensure daily salt intake < 2300 mg/day  Hyperlipidemia Lab Results  Component Value Date/Time   LDLCALC 50 03/18/2019 01:54 PM   LDLDIRECT 60.0 12/13/2017 03:40 PM .  Pharmacist Clinical Goal(s): o Over the next 180 days, patient will work with PharmD and providers to maintain LDL goal < 100 . Current regimen:  o Rosuvastatin 10 mg daily . Interventions: o Discussed cholesterol goals and benefits of medications for prevention of heart attack / stroke . Patient self care activities - Over the next 180 days, patient will: o Continue current medication  Migraine . Pharmacist Clinical Goal(s) o Over the next 180 days, patient will work with PharmD and providers to optimize therapy . Current regimen:  o Ubrelvy 50 mg as needed . Interventions: o Discussed benefits of Ubrelvy and proper timing/administration of medication at first sign of aura/migraine Dorinda Hill PA approved November 2021 . Patient self care activities - Over the next 180 days, patient will: o Continue medication as prescribed o Contact provider for more Ubrelvy samples  Medication management . Pharmacist Clinical Goal(s): o Over the next 180 days, patient will work with PharmD and providers to maintain optimal medication adherence . Current pharmacy: [facility] . Interventions o Comprehensive medication review performed. o Continue current medication management strategy . Patient self care activities - Over the next 180 days, patient will: o Focus on medication adherence by fill date o Take medications as prescribed o Report any questions or concerns to PharmD and/or provider(s)  Please see past updates related to this goal by clicking on the "Past Updates" button in the selected goal        Hypertension   BP goal is:  <130/80  Office blood pressures are  BP Readings from Last 3 Encounters:  06/30/20 (!) 150/86  03/02/20 (!) 114/56   01/21/20 (!) 152/90   Patient checks BP at home infrequently Patient home BP readings are ranging: n/a  Patient has failed these meds in the past: carvedilol, losartan-HCTZ, Bystolic, spironolactone Patient is currently controlled on the following medications:  . Olmesartan 20 mg daily . Furosemide 20 mg daily   We discussed diet and exercise extensively; pt reports adherence and denies issues   Plan  Continue current medications   Hyperlipidemia   LDL goal < 100  Lipid Panel     Component Value Date/Time   CHOL 159 03/18/2019 1354   TRIG 136.0 03/18/2019 1354   HDL 81.40 03/18/2019 1354   LDLCALC 50 03/18/2019 1354   LDLDIRECT 60.0 12/13/2017 1540    Hepatic Function Latest Ref Rng & Units 10/22/2019 04/09/2018 03/12/2018  Total Protein 6.0 - 8.3 g/dL 7.2 7.9 6.9  Albumin 3.5 - 5.2 g/dL 4.5 4.4 4.1  AST 0 - 37 U/L 21 19 16   ALT 0 - 35 U/L 19 17 18   Alk Phosphatase 39 - 117 U/L 83 68 57  Total Bilirubin 0.2 - 1.2 mg/dL 0.6 0.9 0.7  Bilirubin, Direct 0.0 - 0.3 mg/dL 0.1 - -  The 10-year ASCVD risk score Mikey Bussing DC Brooke Bonito., et al., 2013) is: 33.6%   Values used to calculate the score:     Age: 89 years     Sex: Female     Is Non-Hispanic African American: No     Diabetic: No     Tobacco smoker: No     Systolic Blood Pressure: 415 mmHg     Is BP treated: Yes     HDL Cholesterol: 81.4 mg/dL     Total Cholesterol: 159 mg/dL   Patient has failed these meds in past: n/a Patient is currently controlled on the following medications:  . Rosuvastatin 10 mg daily  We discussed:  diet and exercise extensively; Cholesterol goals; benefits of statin for ASCVD risk reduction  Plan  Continue current medications and control with diet and exercise  Migraine   Patient has failed these meds in past: n/a Patient is currently controlled on the following medications:  Marland Kitchen Ubrelvy 50 mg PRN  We discussed:  Pt has not had to use Ubrelvy in months. PA was approved in  November.  Plan  Continue current medications    Medication Management   Pt uses [facility] for all medications Uses pill box? No - prefers bottles Pt endorses 100% compliance  We discussed: Current pharmacy is preferred with insurance plan and patient is satisfied with pharmacy services  Plan  Continue current medication management strategy    Follow up: 6 month phone visit  Charlene Brooke, PharmD, BCACP Clinical Pharmacist [facility] [phone]

## 2020-08-31 ENCOUNTER — Ambulatory Visit (INDEPENDENT_AMBULATORY_CARE_PROVIDER_SITE_OTHER): Payer: Medicare Other | Admitting: Internal Medicine

## 2020-08-31 ENCOUNTER — Encounter: Payer: Self-pay | Admitting: Internal Medicine

## 2020-08-31 ENCOUNTER — Other Ambulatory Visit: Payer: Self-pay

## 2020-08-31 VITALS — BP 130/84 | HR 72 | Temp 98.1°F | Ht 66.0 in | Wt 162.0 lb

## 2020-08-31 DIAGNOSIS — I10 Essential (primary) hypertension: Secondary | ICD-10-CM | POA: Diagnosis not present

## 2020-08-31 DIAGNOSIS — N1832 Chronic kidney disease, stage 3b: Secondary | ICD-10-CM

## 2020-08-31 DIAGNOSIS — Z1231 Encounter for screening mammogram for malignant neoplasm of breast: Secondary | ICD-10-CM

## 2020-08-31 DIAGNOSIS — E785 Hyperlipidemia, unspecified: Secondary | ICD-10-CM

## 2020-08-31 LAB — CBC WITH DIFFERENTIAL/PLATELET
Basophils Absolute: 0 10*3/uL (ref 0.0–0.1)
Basophils Relative: 0.4 % (ref 0.0–3.0)
Eosinophils Absolute: 0 10*3/uL (ref 0.0–0.7)
Eosinophils Relative: 0.7 % (ref 0.0–5.0)
HCT: 40.5 % (ref 36.0–46.0)
Hemoglobin: 13.7 g/dL (ref 12.0–15.0)
Lymphocytes Relative: 27.9 % (ref 12.0–46.0)
Lymphs Abs: 1.9 10*3/uL (ref 0.7–4.0)
MCHC: 33.7 g/dL (ref 30.0–36.0)
MCV: 95.4 fl (ref 78.0–100.0)
Monocytes Absolute: 0.7 10*3/uL (ref 0.1–1.0)
Monocytes Relative: 10.5 % (ref 3.0–12.0)
Neutro Abs: 4.1 10*3/uL (ref 1.4–7.7)
Neutrophils Relative %: 60.5 % (ref 43.0–77.0)
Platelets: 200 10*3/uL (ref 150.0–400.0)
RBC: 4.25 Mil/uL (ref 3.87–5.11)
RDW: 12.9 % (ref 11.5–15.5)
WBC: 6.9 10*3/uL (ref 4.0–10.5)

## 2020-08-31 LAB — BASIC METABOLIC PANEL
BUN: 19 mg/dL (ref 6–23)
CO2: 31 mEq/L (ref 19–32)
Calcium: 9.7 mg/dL (ref 8.4–10.5)
Chloride: 106 mEq/L (ref 96–112)
Creatinine, Ser: 0.97 mg/dL (ref 0.40–1.20)
GFR: 56.78 mL/min — ABNORMAL LOW (ref >60.00)
Glucose, Bld: 61 mg/dL — ABNORMAL LOW (ref 70–99)
Potassium: 3.8 mEq/L (ref 3.5–5.1)
Sodium: 144 mEq/L (ref 135–145)

## 2020-08-31 LAB — LIPID PANEL
Cholesterol: 155 mg/dL (ref 0–200)
HDL: 70 mg/dL (ref >39.00)
LDL Cholesterol: 52 mg/dL (ref 0–99)
NonHDL: 84.79
Total CHOL/HDL Ratio: 2
Triglycerides: 165 mg/dL — ABNORMAL HIGH (ref 0.0–149.0)
VLDL: 33 mg/dL (ref 0.0–40.0)

## 2020-08-31 LAB — HEPATIC FUNCTION PANEL
ALT: 19 U/L (ref 0–35)
AST: 21 U/L (ref 0–37)
Albumin: 4.2 g/dL (ref 3.5–5.2)
Alkaline Phosphatase: 77 U/L (ref 39–117)
Bilirubin, Direct: 0.1 mg/dL (ref 0.0–0.3)
Total Bilirubin: 0.8 mg/dL (ref 0.2–1.2)
Total Protein: 7.2 g/dL (ref 6.0–8.3)

## 2020-08-31 MED ORDER — OLMESARTAN MEDOXOMIL 20 MG PO TABS: ORAL_TABLET | ORAL | 1 refills | Status: DC

## 2020-08-31 MED ORDER — ROSUVASTATIN CALCIUM 10 MG PO TABS: 10.0000 mg | ORAL_TABLET | Freq: Every day | ORAL | 1 refills | Status: DC

## 2020-08-31 NOTE — Patient Instructions (Signed)

## 2020-08-31 NOTE — Progress Notes (Signed)
Subjective:  Patient ID: Jamie Villa, female    DOB: [birth year]  Age: 75 y.o. MRN: [id number]  CC: Hypertension  This visit occurred during the SARS-CoV-2 public health emergency.  Safety protocols were in place, including screening questions prior to the visit, additional usage of staff PPE, and extensive cleaning of exam room while observing appropriate contact time as indicated for disinfecting solutions.    HPI Jamie Villa presents for f/up -  She tells me her blood pressure has been well controlled.  She is active and denies CP, DOE, palpitations, edema, fatigue, dizziness, or lightheadedness.  Outpatient Medications Prior to Visit  Medication Sig Dispense Refill   Cholecalciferol 50 MCG (2000 UT) TABS Take 1 tablet (2,000 Units total) by mouth daily. 90 tablet 1   furosemide (LASIX) 20 MG tablet Take 20 mg by mouth daily.     Ubrogepant (UBRELVY) 50 MG TABS Take 1 tablet by mouth daily as needed. 12 tablet 1   meclizine (ANTIVERT) 25 MG tablet Take 1 tablet (25 mg total) by mouth 3 (three) times daily as needed for dizziness. 65 tablet 2   olmesartan (BENICAR) 20 MG tablet TAKE 1 TABLET(20 MG) BY MOUTH DAILY 90 tablet 1   rosuvastatin (CRESTOR) 10 MG tablet Take 1 tablet (10 mg total) by mouth daily. 90 tablet 1   No facility-administered medications prior to visit.    ROS Review of Systems  Constitutional: Negative for appetite change, diaphoresis and fatigue.  HENT: Negative.   Eyes: Negative.   Respiratory: Negative for cough, chest tightness, shortness of breath and wheezing.   Cardiovascular: Negative for chest pain, palpitations and leg swelling.  Gastrointestinal: Negative for abdominal pain and nausea.  Endocrine: Negative.   Genitourinary: Negative for difficulty urinating.  Musculoskeletal: Negative.  Negative for arthralgias.  Skin: Negative.   Neurological: Negative for dizziness, weakness and light-headedness.  Hematological: Negative  for adenopathy. Does not bruise/bleed easily.  Psychiatric/Behavioral: Negative.     Objective:  BP 130/84    Pulse 72    Temp 98.1 F (36.7 C) (Oral)    Ht 5\' 6"  (1.676 m)    Wt 162 lb (73.5 kg)    SpO2 98%    BMI 26.15 kg/m   BP Readings from Last 3 Encounters:  08/31/20 130/84  06/30/20 (!) 150/86  03/02/20 (!) 114/56    Wt Readings from Last 3 Encounters:  08/31/20 162 lb (73.5 kg)  06/30/20 162 lb 9.6 oz (73.8 kg)  03/02/20 159 lb (72.1 kg)    Physical Exam Vitals reviewed.  Constitutional:      Appearance: Normal appearance.  HENT:     Nose: Nose normal.     Mouth/Throat:     Mouth: Mucous membranes are moist.  Eyes:     General: No scleral icterus. Cardiovascular:     Rate and Rhythm: Normal rate and regular rhythm.     Heart sounds: No murmur heard.   Pulmonary:     Effort: Pulmonary effort is normal.     Breath sounds: No stridor. No wheezing, rhonchi or rales.  Abdominal:     General: Abdomen is flat. There is no distension.  Musculoskeletal:        General: Normal range of motion.     Cervical back: Neck supple.     Right lower leg: No edema.     Left lower leg: No edema.  Lymphadenopathy:     Cervical: No cervical adenopathy.  Skin:    General: Skin is  warm and dry.  Neurological:     General: No focal deficit present.     Mental Status: She is alert.  Psychiatric:        Mood and Affect: Mood normal.        Behavior: Behavior normal.     Lab Results  Component Value Date   WBC 6.9 08/31/2020   HGB 13.7 08/31/2020   HCT 40.5 08/31/2020   PLT 200.0 08/31/2020   GLUCOSE 61 (L) 08/31/2020   CHOL 155 08/31/2020   TRIG 165.0 (H) 08/31/2020   HDL 70.00 08/31/2020   LDLDIRECT 60.0 12/13/2017   LDLCALC 52 08/31/2020   ALT 19 08/31/2020   AST 21 08/31/2020   NA 144 08/31/2020   K 3.8 08/31/2020   CL 106 08/31/2020   CREATININE 0.97 08/31/2020   BUN 19 08/31/2020   CO2 31 08/31/2020   TSH 3.39 11/28/2019   INR 0.90 10/14/2009   HGBA1C  5.7 10/22/2019    US RENAL  Result Date: 11/20/2019 CLINICAL DATA:  Initial evaluation for chronic kidney disease, stage III. EXAM: RENAL / URINARY TRACT ULTRASOUND COMPLETE COMPARISON:  Prior ultrasound from 03/20/2018. FINDINGS: Right Kidney: Renal measurements: 8.5 x 4.3 x 3.9 cm = volume: 74 mL. Renal echogenicity within normal limits. No nephrolithiasis or hydronephrosis. No focal renal mass. Left Kidney: Renal measurements: 9.0 x 5.4 x 4.0 cm = volume: 101 mL. Renal echogenicity within normal limits. No nephrolithiasis or hydronephrosis. No focal renal mass. Bladder: Appears normal for degree of bladder distention. Other: None. IMPRESSION: Normal renal ultrasound. Renal echogenicity within normal limits. No hydronephrosis. Electronically Signed   By: Jeannine Boga M.D.   On: 11/20/2019 20:52    Assessment & Plan:   Jamie Villa was seen today for hypertension.  Diagnoses and all orders for this visit:  Stage 3b chronic kidney disease ([place])- Her renal function is stable.  She will avoid nephrotoxic agents. -     Basic metabolic panel; Future -     Basic metabolic panel  Essential hypertension- Her blood pressure is adequately well controlled. -     Discontinue: olmesartan (BENICAR) 20 MG tablet; TAKE 1 TABLET(20 MG) BY MOUTH DAILY -     Basic metabolic panel; Future -     CBC with Differential/Platelet; Future -     olmesartan (BENICAR) 20 MG tablet; TAKE 1 TABLET(20 MG) BY MOUTH DAILY -     CBC with Differential/Platelet -     Basic metabolic panel  Dyslipidemia, goal LDL below 100- She has achieved her LDL goal and is doing well on the statin. -     Lipid panel; Future -     Hepatic function panel; Future -     rosuvastatin (CRESTOR) 10 MG tablet; Take 1 tablet (10 mg total) by mouth daily. -     Hepatic function panel -     Lipid panel  Visit for screening mammogram -     MM DIGITAL SCREENING BILATERAL; Future   I have discontinued Jamie Villa's meclizine. I am also  having her maintain her Cholecalciferol, Ubrelvy, furosemide, rosuvastatin, and olmesartan.  Meds ordered this encounter  Medications   DISCONTD: olmesartan (BENICAR) 20 MG tablet    Sig: TAKE 1 TABLET(20 MG) BY MOUTH DAILY    Dispense:  90 tablet    Refill:  1   rosuvastatin (CRESTOR) 10 MG tablet    Sig: Take 1 tablet (10 mg total) by mouth daily.    Dispense:  90 tablet  Refill:  1   olmesartan (BENICAR) 20 MG tablet    Sig: TAKE 1 TABLET(20 MG) BY MOUTH DAILY    Dispense:  90 tablet    Refill:  1     Follow-up: Return in about 6 months (around 03/03/2021).  Scarlette Calico, MD

## 2020-09-01 ENCOUNTER — Encounter: Payer: Self-pay | Admitting: Internal Medicine

## 2020-09-18 ENCOUNTER — Ambulatory Visit: Payer: Medicare Other

## 2020-09-30 ENCOUNTER — Telehealth: Payer: Self-pay | Admitting: Pharmacist

## 2020-09-30 NOTE — Progress Notes (Signed)
    Chronic Care Management Pharmacy Assistant   Name: Joelie Schou  MRN: [id number] DOB: 06-04-1946   Reason for Encounter: General Disease State Call    Recent office visits:  08/31/20 Dr. Ronnald Ramp, no medication changes  Recent consult visits:  None ID  Hospital visits:  None in previous 6 months  Medications: Outpatient Encounter Medications as of 09/30/2020  Medication Sig  . Cholecalciferol 50 MCG (2000 UT) TABS Take 1 tablet (2,000 Units total) by mouth daily.  . furosemide (LASIX) 20 MG tablet Take 20 mg by mouth daily.  Marland Kitchen olmesartan (BENICAR) 20 MG tablet TAKE 1 TABLET(20 MG) BY MOUTH DAILY  . rosuvastatin (CRESTOR) 10 MG tablet Take 1 tablet (10 mg total) by mouth daily.  Marland Kitchen Ubrogepant (UBRELVY) 50 MG TABS Take 1 tablet by mouth daily as needed.   No facility-administered encounter medications on file as of 09/30/2020.   Pharmacist Review  Have you had any problems recently with your health? The patient stated that she does not have any new health issues  Have you had any problems with your pharmacy? The patient states that she does not have any problems getting her medication or the cost of her medications from the pharmacy  What issues or side effects are you having with your medications? The patient states that she does not have any side effects from medications  What would you like me to pass along to [facility] for them to help you with?  The patient states that she is doing well and that she does not have any concerns about her health or her medications at this time  What can we do to take care of you better? The patient states if she has any changes in her health she will call Dr. Ronnald Ramp   Star Rating Drugs: Olmesartan 08/31/20 90 ds Rosuvastatin 08/31/20 90 ds   [place] Pharmacist Assistant [phone]  Time spent:20

## 2020-10-06 ENCOUNTER — Other Ambulatory Visit: Payer: Self-pay | Admitting: Internal Medicine

## 2020-10-06 ENCOUNTER — Ambulatory Visit (INDEPENDENT_AMBULATORY_CARE_PROVIDER_SITE_OTHER): Payer: Medicare Other

## 2020-10-06 ENCOUNTER — Other Ambulatory Visit: Payer: Self-pay

## 2020-10-06 VITALS — BP 122/70 | HR 99 | Temp 97.6°F | Resp 16 | Ht 66.0 in | Wt 159.4 lb

## 2020-10-06 DIAGNOSIS — Z0000 Encounter for general adult medical examination without abnormal findings: Secondary | ICD-10-CM | POA: Diagnosis not present

## 2020-10-06 DIAGNOSIS — Z1231 Encounter for screening mammogram for malignant neoplasm of breast: Secondary | ICD-10-CM

## 2020-10-06 DIAGNOSIS — J301 Allergic rhinitis due to pollen: Secondary | ICD-10-CM

## 2020-10-06 MED ORDER — LEVOCETIRIZINE DIHYDROCHLORIDE 5 MG PO TABS: 5.0000 mg | ORAL_TABLET | Freq: Every evening | ORAL | 1 refills | Status: DC

## 2020-10-06 NOTE — Patient Instructions (Signed)
Jamie Villa , Thank you for taking time to come for your Medicare Wellness Visit. I appreciate your ongoing commitment to your health goals. Please review the following plan we discussed and let me know if I can assist you in the future.   Screening recommendations/referrals: Colonoscopy: 12/06/2017; no longer recommeded Mammogram: 02/08/2018; overdue Bone Density: 05/06/2011; normal results (no longer recommended) Recommended yearly ophthalmology/optometry visit for glaucoma screening and checkup Recommended yearly dental visit for hygiene and checkup  Vaccinations: Influenza vaccine: declined Pneumococcal vaccine: 04/29/2010, 05/08/2014 (completed) Tdap vaccine: 12/05/2013; due every 10 years Shingles vaccine: never done Covid-19: 11/07/2019, 12/02/2019  Advanced directives: Documents on file.  Conditions/risks identified: Yes; Reviewed health maintenance screenings with patient today and relevant education, vaccines, and/or referrals were provided. Please continue to do your personal lifestyle choices by: daily care of teeth and gums, regular physical activity (goal should be 5 days a week for 30 minutes), eat a healthy diet, avoid tobacco and drug use, limiting any alcohol intake, taking a low-dose aspirin (if not allergic or have been advised by your provider otherwise) and taking vitamins and minerals as recommended by your provider. Continue doing brain stimulating activities (puzzles, reading, adult coloring books, staying active) to keep memory sharp. Continue to eat heart healthy diet (full of fruits, vegetables, whole grains, lean protein, water--limit salt, fat, and sugar intake) and increase physical activity as tolerated.  Next appointment: Please schedule your next Medicare Wellness Visit with your Nurse Health Advisor in 1 year by calling [phone].   Preventive Care 46 Years and Older, Female Preventive care refers to lifestyle choices and visits with your health care provider  that can promote health and wellness. What does preventive care include?  A yearly physical exam. This is also called an annual well check.  Dental exams once or twice a year.  Routine eye exams. Ask your health care provider how often you should have your eyes checked.  Personal lifestyle choices, including:  Daily care of your teeth and gums.  Regular physical activity.  Eating a healthy diet.  Avoiding tobacco and drug use.  Limiting alcohol use.  Practicing safe sex.  Taking low-dose aspirin every day.  Taking vitamin and mineral supplements as recommended by your health care provider. What happens during an annual well check? The services and screenings done by your health care provider during your annual well check will depend on your age, overall health, lifestyle risk factors, and family history of disease. Counseling  Your health care provider may ask you questions about your:  Alcohol use.  Tobacco use.  Drug use.  Emotional well-being.  Home and relationship well-being.  Sexual activity.  Eating habits.  History of falls.  Memory and ability to understand (cognition).  Work and work Statistician.  Reproductive health. Screening  You may have the following tests or measurements:  Height, weight, and BMI.  Blood pressure.  Lipid and cholesterol levels. These may be checked every 5 years, or more frequently if you are over 6 years old.  Skin check.  Lung cancer screening. You may have this screening every year starting at age 67 if you have a 30-pack-year history of smoking and currently smoke or have quit within the past 15 years.  Fecal occult blood test (FOBT) of the stool. You may have this test every year starting at age 98.  Flexible sigmoidoscopy or colonoscopy. You may have a sigmoidoscopy every 5 years or a colonoscopy every 10 years starting at age 30.  Hepatitis C blood  test.  Hepatitis B blood test.  Sexually transmitted  disease (STD) testing.  Diabetes screening. This is done by checking your blood sugar (glucose) after you have not eaten for a while (fasting). You may have this done every 1-3 years.  Bone density scan. This is done to screen for osteoporosis. You may have this done starting at age 2.  Mammogram. This may be done every 1-2 years. Talk to your health care provider about how often you should have regular mammograms. Talk with your health care provider about your test results, treatment options, and if necessary, the need for more tests. Vaccines  Your health care provider may recommend certain vaccines, such as:  Influenza vaccine. This is recommended every year.  Tetanus, diphtheria, and acellular pertussis (Tdap, Td) vaccine. You may need a Td booster every 10 years.  Zoster vaccine. You may need this after age 39.  Pneumococcal 13-valent conjugate (PCV13) vaccine. One dose is recommended after age 11.  Pneumococcal polysaccharide (PPSV23) vaccine. One dose is recommended after age 64. Talk to your health care provider about which screenings and vaccines you need and how often you need them. This information is not intended to replace advice given to you by your health care provider. Make sure you discuss any questions you have with your health care provider. Document Released: 07/03/2015 Document Revised: 02/24/2016 Document Reviewed: 04/07/2015 Elsevier Interactive Patient Education  2017 [place] Prevention in the Home Falls can cause injuries. They can happen to people of all ages. There are many things you can do to make your home safe and to help prevent falls. What can I do on the outside of my home?  Regularly fix the edges of walkways and driveways and fix any cracks.  Remove anything that might make you trip as you walk through a door, such as a raised step or threshold.  Trim any bushes or trees on the path to your home.  Use bright outdoor  lighting.  Clear any walking paths of anything that might make someone trip, such as rocks or tools.  Regularly check to see if handrails are loose or broken. Make sure that both sides of any steps have handrails.  Any raised decks and porches should have guardrails on the edges.  Have any leaves, snow, or ice cleared regularly.  Use sand or salt on walking paths during winter.  Clean up any spills in your garage right away. This includes oil or grease spills. What can I do in the bathroom?  Use night lights.  Install grab bars by the toilet and in the tub and shower. Do not use towel bars as grab bars.  Use non-skid mats or decals in the tub or shower.  If you need to sit down in the shower, use a plastic, non-slip stool.  Keep the floor dry. Clean up any water that spills on the floor as soon as it happens.  Remove soap buildup in the tub or shower regularly.  Attach bath mats securely with double-sided non-slip rug tape.  Do not have throw rugs and other things on the floor that can make you trip. What can I do in the bedroom?  Use night lights.  Make sure that you have a light by your bed that is easy to reach.  Do not use any sheets or blankets that are too big for your bed. They should not hang down onto the floor.  Have a firm chair that has side arms. You can  use this for support while you get dressed.  Do not have throw rugs and other things on the floor that can make you trip. What can I do in the kitchen?  Clean up any spills right away.  Avoid walking on wet floors.  Keep items that you use a lot in easy-to-reach places.  If you need to reach something above you, use a strong step stool that has a grab bar.  Keep electrical cords out of the way.  Do not use floor polish or wax that makes floors slippery. If you must use wax, use non-skid floor wax.  Do not have throw rugs and other things on the floor that can make you trip. What can I do with my  stairs?  Do not leave any items on the stairs.  Make sure that there are handrails on both sides of the stairs and use them. Fix handrails that are broken or loose. Make sure that handrails are as long as the stairways.  Check any carpeting to make sure that it is firmly attached to the stairs. Fix any carpet that is loose or worn.  Avoid having throw rugs at the top or bottom of the stairs. If you do have throw rugs, attach them to the floor with carpet tape.  Make sure that you have a light switch at the top of the stairs and the bottom of the stairs. If you do not have them, ask someone to add them for you. What else can I do to help prevent falls?  Wear shoes that:  Do not have high heels.  Have rubber bottoms.  Are comfortable and fit you well.  Are closed at the toe. Do not wear sandals.  If you use a stepladder:  Make sure that it is fully opened. Do not climb a closed stepladder.  Make sure that both sides of the stepladder are locked into place.  Ask someone to hold it for you, if possible.  Clearly mark and make sure that you can see:  Any grab bars or handrails.  First and last steps.  Where the edge of each step is.  Use tools that help you move around (mobility aids) if they are needed. These include:  Canes.  Walkers.  Scooters.  Crutches.  Turn on the lights when you go into a dark area. Replace any light bulbs as soon as they burn out.  Set up your furniture so you have a clear path. Avoid moving your furniture around.  If any of your floors are uneven, fix them.  If there are any pets around you, be aware of where they are.  Review your medicines with your doctor. Some medicines can make you feel dizzy. This can increase your chance of falling. Ask your doctor what other things that you can do to help prevent falls. This information is not intended to replace advice given to you by your health care provider. Make sure you discuss any  questions you have with your health care provider. Document Released: 04/02/2009 Document Revised: 11/12/2015 Document Reviewed: 07/11/2014 Elsevier Interactive Patient Education  2017 [employer].

## 2020-10-06 NOTE — Progress Notes (Signed)
Subjective:   Jamie Villa is a 75 y.o. female who presents for Medicare Annual (Subsequent) preventive examination.  Review of Systems    No ROS. Medicare Wellness Visit. Additional risk factors are reflected in social history. Cardiac Risk Factors include: advanced age (>34men, >90 women);dyslipidemia;hypertension  Sleep Patterns: No sleep issues, feels rested on waking and sleeps 6-8 hours nightly. Home Safety/Smoke Alarms: Feels safe in home; uses home alarm. Smoke alarms in place. Living environment: 2-story home; Lives with spouse; no needs for DME; good support system. Seat Belt Safety/Bike Helmet: Wears seat belt.    Objective:    Today's Vitals   10/06/20 1236  BP: 122/70  Pulse: 99  Resp: 16  Temp: 97.6 F (36.4 C)  SpO2: 97%  Weight: 159 lb 6.4 oz (72.3 kg)  Height: 5\' 6"  (1.676 m)  PainSc: 0-No pain   Body mass index is 25.73 kg/m.  Advanced Directives 10/06/2020 06/30/2020 12/27/2019 05/03/2019 07/12/2018 07/05/2018 03/02/2018  Does Patient Have a Medical Advance Directive? Yes Yes Yes Yes Yes Yes Yes  Type of Advance Directive - [place];Living will [place];Living will Living will Living will [place];Living will  Does patient want to make changes to medical advance directive? No - Patient declined - - - No - Patient declined No - Patient declined -  Copy of [place] in Chart? - - No - copy requested No - copy requested - - No - copy requested    Current Medications (verified) Outpatient Encounter Medications as of 10/06/2020  Medication Sig  . Cholecalciferol 50 MCG (2000 UT) TABS Take 1 tablet (2,000 Units total) by mouth daily.  . furosemide (LASIX) 20 MG tablet Take 20 mg by mouth daily.  Marland Kitchen olmesartan (BENICAR) 20 MG tablet TAKE 1 TABLET(20 MG) BY MOUTH DAILY  . rosuvastatin (CRESTOR) 10 MG tablet Take 1 tablet (10 mg total) by mouth daily.   Marland Kitchen Ubrogepant (UBRELVY) 50 MG TABS Take 1 tablet by mouth daily as needed.   No facility-administered encounter medications on file as of 10/06/2020.    Allergies (verified) Codeine and Penicillins   History: Past Medical History:  Diagnosis Date  . Cancer ([place])    Large B cell Lymphoma  . Complication of anesthesia    Difficult to arouse  . Hiatal hernia   . HTN (hypertension)   . Hx of colonic polyps   . Hyperlipidemia   . Hypothyroid   . Pneumonia    Healthcare-associated versus community-acquired pneumonia  . PONV (postoperative nausea and vomiting)    Past Surgical History:  Procedure Laterality Date  . ABDOMINAL HYSTERECTOMY    . CHOLECYSTECTOMY N/A 07/12/2018   Procedure: LAPAROSCOPIC CHOLECYSTECTOMY WITH INTRAOPERATIVE CHOLANGIOGRAM ERAS PATHWAY;  Surgeon: Jovita Kussmaul, MD;  Location: [facility];  Service: General;  Laterality: N/A;  . COLONOSCOPY  11/2017  . S/P STEM CELL TRANSPLANT     Large B-Cell lymphoma   Family History  Problem Relation Age of Onset  . Hypertension Other   . Coronary artery disease Neg Hx    Social History   Socioeconomic History  . Marital status: Married    Spouse name: Not on file  . Number of children: 2  . Years of education: Not on file  . Highest education level: Not on file  Occupational History  . 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 Smoking status:  Never Smoker  . Smokeless tobacco: Never Used  Vaping Use  . Vaping Use: Never used  Substance and Sexual Activity  . Alcohol use: Yes    Alcohol/week: 1.0 standard drink    Types: 1 Glasses of wine per week    Comment: 1 glass of wine several times weekly  . Drug use: No  . Sexual activity: Yes  Other Topics Concern  . Not on file  Social History Narrative   Married and currently lives with husband.   Social Determinants of Health   Financial Resource Strain: Low Risk   . Difficulty of Paying Living Expenses: Not very  hard  Food Insecurity: No Food Insecurity  . Worried About Charity fundraiser in the Last Year: Never true  . Ran Out of Food in the Last Year: Never true  Transportation Needs: No Transportation Needs  . Lack of Transportation (Medical): No  . Lack of Transportation (Non-Medical): No  Physical Activity: Sufficiently Active  . Days of Exercise per Week: 5 days  . Minutes of Exercise per Session: 30 min  Stress: No Stress Concern Present  . Feeling of Stress : Not at all  Social Connections: Socially Integrated  . Frequency of Communication with Friends and Family: More than three times a week  . Frequency of Social Gatherings with Friends and Family: More than three times a week  . Attends Religious Services: More than 4 times per year  . Active Member of Clubs or Organizations: Yes  . Attends Archivist Meetings: More than 4 times per year  . Marital Status: Married    Tobacco Counseling Counseling given: Not Answered   Clinical Intake:  Pre-visit preparation completed: Yes  Pain : No/denies pain Pain Score: 0-No pain     BMI - recorded: 25.73 Nutritional Status: BMI 25 -29 Overweight Nutritional Risks: None Diabetes: No  How often do you need to have someone help you when you read instructions, pamphlets, or other written materials from your doctor or pharmacy?: 1 - Never What is the last grade level you completed in school?: High School Graduate; Retired Armed forces operational officer  Diabetic? no  Interpreter Needed?: No  Information entered by :: Lisette Abu, LPN   Activities of Daily Living In your present state of health, do you have any difficulty performing the following activities: 10/06/2020 08/31/2020  Hearing? N N  Vision? N N  Difficulty concentrating or making decisions? N N  Walking or climbing stairs? N N  Dressing or bathing? N N  Doing errands, shopping? N N  Preparing Food and eating ? N -  Using the Toilet? N -  In the past six months,  have you accidently leaked urine? N -  Do you have problems with loss of bowel control? N -  Managing your Medications? N -  Managing your Finances? N -  Housekeeping or managing your Housekeeping? N -  Some recent data might be hidden    Patient Care Team: [name], MD as PCP - Elwin Sleight, MD as Referring Physician (Internal Medicine) Charlton Haws, [facility] as Pharmacist (Pharmacist)  Indicate any recent Medical Services you may have received from other than [facility] providers in the past year (date may be approximate).     Assessment:   This is a routine wellness examination for [place].  Hearing/Vision screen No exam data present  Dietary issues and exercise activities discussed: Current Exercise Habits: Home exercise routine, Type of exercise: walking, Time (Minutes): 30, Frequency (  Times/Week): 5, Weekly Exercise (Minutes/Week): 150, Intensity: Moderate, Exercise limited by: None identified  Goals    . maintain current health status     Eat healthy, exercise, enjoy family and life.    . Patient Stated     Maintain current life status, continue to enjoy life and love family.    Marland Kitchen Pharmacy Care Plan     CARE PLAN ENTRY (see longitudinal plan of care for additional care plan information)  Current Barriers:  . Chronic Disease Management support, education, and care coordination needs related to Hypertension and Hyperlipidemia   Hypertension BP Readings from Last 3 Encounters:  06/30/20 (!) 150/86  03/02/20 (!) 114/56  01/21/20 (!) 152/90 .  Pharmacist Clinical Goal(s): o Over the next 180 days, patient will work with PharmD and providers to maintain BP goal <130/80 . Current regimen:  o Olmesartan 20 mg daily o Furosemide 20 mg daily . Interventions: o Discussed BP goals and benefits of medications for prevention of heart attack / stroke . Patient self care activities - Over the next 180 days, patient will: o Check BP weekly, document, and provide at  future appointments o Ensure daily salt intake < 2300 mg/day  Hyperlipidemia Lab Results  Component Value Date/Time   LDLCALC 50 03/18/2019 01:54 PM   LDLDIRECT 60.0 12/13/2017 03:40 PM .  Pharmacist Clinical Goal(s): o Over the next 180 days, patient will work with PharmD and providers to maintain LDL goal < 100 . Current regimen:  o Rosuvastatin 10 mg daily . Interventions: o Discussed cholesterol goals and benefits of medications for prevention of heart attack / stroke . Patient self care activities - Over the next 180 days, patient will: o Continue current medication  Migraine . Pharmacist Clinical Goal(s) o Over the next 180 days, patient will work with PharmD and providers to optimize therapy . Current regimen:  o Ubrelvy 50 mg as needed . Interventions: o Discussed benefits of Ubrelvy and proper timing/administration of medication at first sign of aura/migraine Dorinda Hill PA approved November 2021 . Patient self care activities - Over the next 180 days, patient will: o Continue medication as prescribed o Contact provider for more Ubrelvy samples  Medication management . Pharmacist Clinical Goal(s): o Over the next 180 days, patient will work with PharmD and providers to maintain optimal medication adherence . Current pharmacy: [facility] . Interventions o Comprehensive medication review performed. o Continue current medication management strategy . Patient self care activities - Over the next 180 days, patient will: o Focus on medication adherence by fill date o Take medications as prescribed o Report any questions or concerns to PharmD and/or provider(s)  Please see past updates related to this goal by clicking on the "Past Updates" button in the selected goal       Depression Screen PHQ 2/9 Scores 10/06/2020 05/03/2019 03/18/2019 03/02/2018 02/22/2017 09/17/2015 08/28/2014  PHQ - 2 Score 0 0 1 1 1  0 0  PHQ- 9 Score - - 3 2 1  - -    Fall Risk Fall Risk  10/06/2020  12/27/2019 05/03/2019 03/18/2019 01/21/2019  Falls in the past year? 0 1 0 0 (No Data)  Comment - - - - Emmi Telephone Survey: data to providers prior to load  Number falls in past yr: 0 0 0 0 (No Data)  Comment - - - - Emmi Telephone Survey Actual Response =   Injury with Fall? 0 0 0 0 -  Risk for fall due to : No Fall Risks - - - -  Follow up Falls evaluation completed - - Falls evaluation completed -    FALL RISK PREVENTION PERTAINING TO THE HOME:  Any stairs in or around the home? Yes  If so, are there any without handrails? No  Home free of loose throw rugs in walkways, pet beds, electrical cords, etc? Yes  Adequate lighting in your home to reduce risk of falls? Yes   ASSISTIVE DEVICES UTILIZED TO PREVENT FALLS:  Life alert? No  Use of a cane, walker or w/c? No  Grab bars in the bathroom? Yes  Shower chair or bench in shower? No  Elevated toilet seat or a handicapped toilet? Yes   TIMED UP AND GO:  Was the test performed? No .  Length of time to ambulate 10 feet: 0 sec.   Gait steady and fast without use of assistive device  Cognitive Function: Normal cognitive status assessed by direct observation by this Nurse Health Advisor. No abnormalities found.   MMSE - Mini Mental State Exam 02/22/2017  Orientation to time 5  Orientation to Place 5  Registration 3  Attention/ Calculation 5  Recall 3  Language- name 2 objects 2  Language- repeat 1  Language- follow 3 step command 3  Language- read & follow direction 1  Write a sentence 1  Copy design 1  Total score 30        Immunizations Immunization History  Administered Date(s) Administered  . Influenza Split 05/16/2011, 04/03/2012  . PFIZER(Purple Top)SARS-COV-2 Vaccination 11/07/2019, 12/02/2019  . Pneumococcal Conjugate-13 04/29/2010  . Pneumococcal Polysaccharide-23 05/08/2014  . Td 06/20/2004, 12/05/2013  . Zoster 06/04/2013    TDAP status: Up to date  Flu Vaccine status: Declined, Education has been  provided regarding the importance of this vaccine but patient still declined. Advised may receive this vaccine at local pharmacy or Health Dept. Aware to provide a copy of the vaccination record if obtained from local pharmacy or Health Dept. Verbalized acceptance and understanding.  Pneumococcal vaccine status: Up to date  Covid-19 vaccine status: Completed vaccines  Qualifies for Shingles Vaccine? Yes   Zostavax completed Yes   Shingrix Completed?: No.    Education has been provided regarding the importance of this vaccine. Patient has been advised to call insurance company to determine out of pocket expense if they have not yet received this vaccine. Advised may also receive vaccine at local pharmacy or Health Dept. Verbalized acceptance and understanding.  Screening Tests Health Maintenance  Topic Date Due  . COVID-19 Vaccine (3 - Pfizer risk 4-dose series) 12/30/2019  . INFLUENZA VACCINE  01/18/2021  . COLONOSCOPY (Pts 45-34yrs Insurance coverage will need to be confirmed)  12/07/2022  . TETANUS/TDAP  12/06/2023  . DEXA SCAN  Completed  . Hepatitis C Screening  Completed  . PNA vac Low Risk Adult  Completed  . HPV VACCINES  Aged Out    Health Maintenance  Health Maintenance Due  Topic Date Due  . COVID-19 Vaccine (3 - Pfizer risk 4-dose series) 12/30/2019    Colorectal cancer screening: No longer required.   Mammogram status: Completed 02/08/2018. Repeat every year  Bone Density status: Completed 05/06/2011. Results reflect: Bone density results: NORMAL. Repeat every 0 years.  Lung Cancer Screening: (Low Dose CT Chest recommended if Age 2-80 years, 30 pack-year currently smoking OR have quit w/in 15years.) does not qualify.   Lung Cancer Screening Referral: no  Additional Screening:  Hepatitis C Screening: does qualify; Completed yes  Vision Screening: Recommended annual ophthalmology exams for early detection of  glaucoma and other disorders of the eye. Is the  patient up to date with their annual eye exam?  Yes  Who is the provider or what is the name of the office in which the patient attends annual eye exams? Helmut Muster, MD. If pt is not established with a provider, would they like to be referred to a provider to establish care? No .   Dental Screening: Recommended annual dental exams for proper oral hygiene  Community Resource Referral / Chronic Care Management: CRR required this visit?  No   CCM required this visit?  No      Plan:     I have personally reviewed and noted the following in the patient's chart:   . Medical and social history . Use of alcohol, tobacco or illicit drugs  . Current medications and supplements . Functional ability and status . Nutritional status . Physical activity . Advanced directives . List of other physicians . Hospitalizations, surgeries, and ER visits in previous 12 months . Vitals . Screenings to include cognitive, depression, and falls . Referrals and appointments  In addition, I have reviewed and discussed with patient certain preventive protocols, quality metrics, and best practice recommendations. A written personalized care plan for preventive services as well as general preventive health recommendations were provided to patient.     Sheral Flow, LPN   [id number]   Nurse Notes:  Medications reviewed with patient; no opioid use noted.

## 2020-10-28 DIAGNOSIS — Z1231 Encounter for screening mammogram for malignant neoplasm of breast: Secondary | ICD-10-CM | POA: Diagnosis not present

## 2020-10-28 LAB — HM MAMMOGRAPHY

## 2020-11-10 DIAGNOSIS — R921 Mammographic calcification found on diagnostic imaging of breast: Secondary | ICD-10-CM | POA: Diagnosis not present

## 2020-11-10 LAB — HM MAMMOGRAPHY

## 2020-11-16 IMAGING — US US RENAL
1 series · 14 of 25 positions shown · non-contrast
Comparison: Prior ultrasound from 03/20/2018.

CLINICAL DATA: Initial evaluation for chronic kidney disease, stage
III.

EXAM:
RENAL / URINARY TRACT ULTRASOUND COMPLETE

[Series 1: us renal · 0.20mm/px · 14 of 30 slices shown]
[im 1/30]
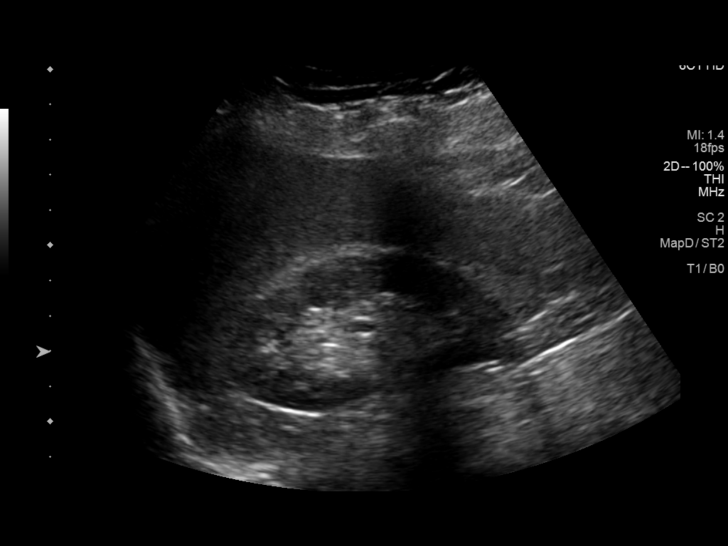
[im 3/30]
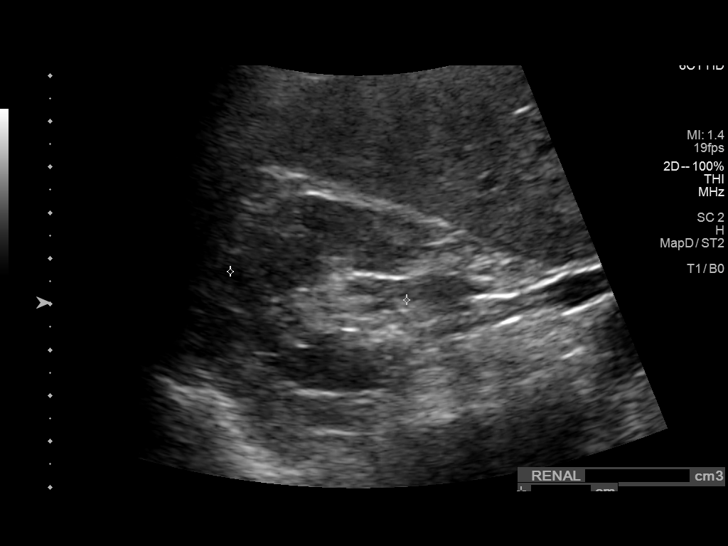
[im 5/30]
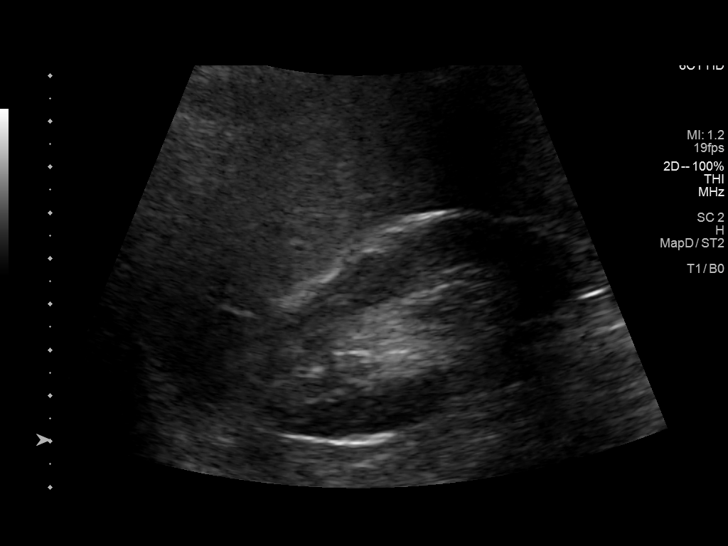
[im 8/30]
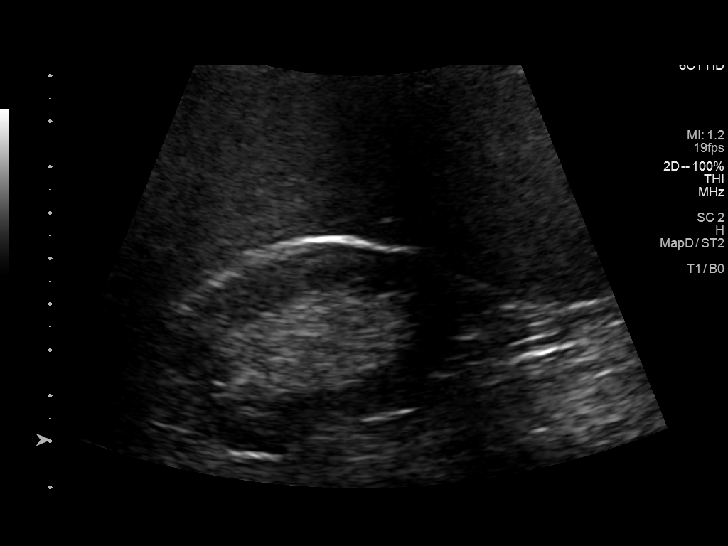
[im 10/30]
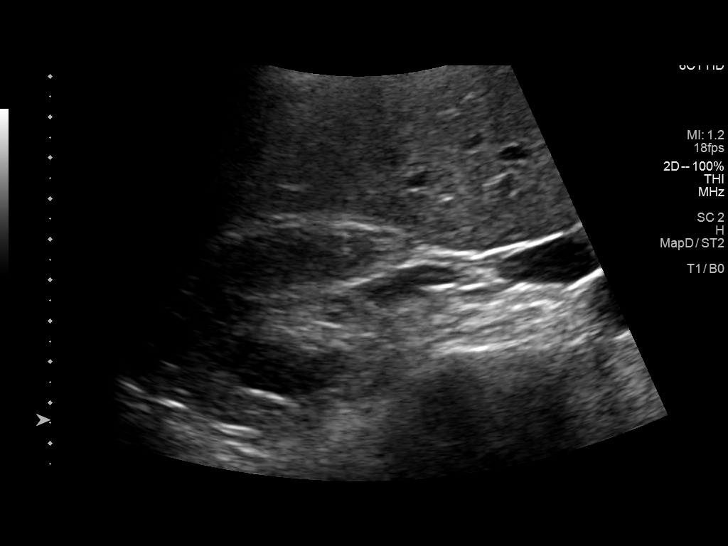
[im 11/30]
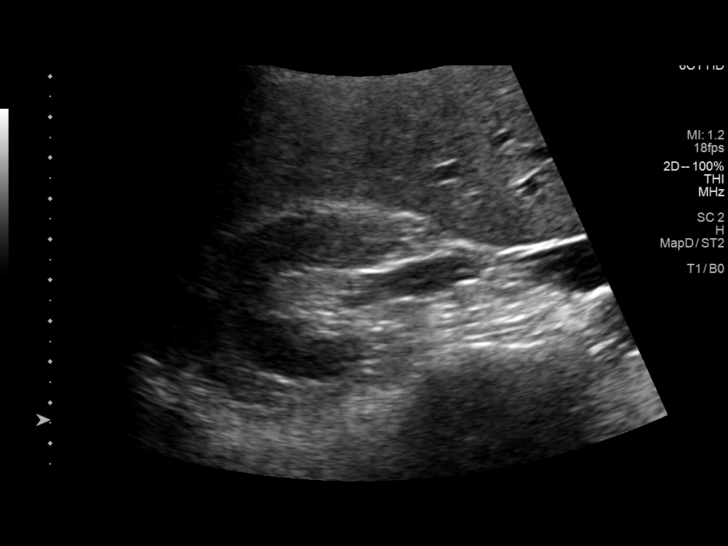
[im 14/30]
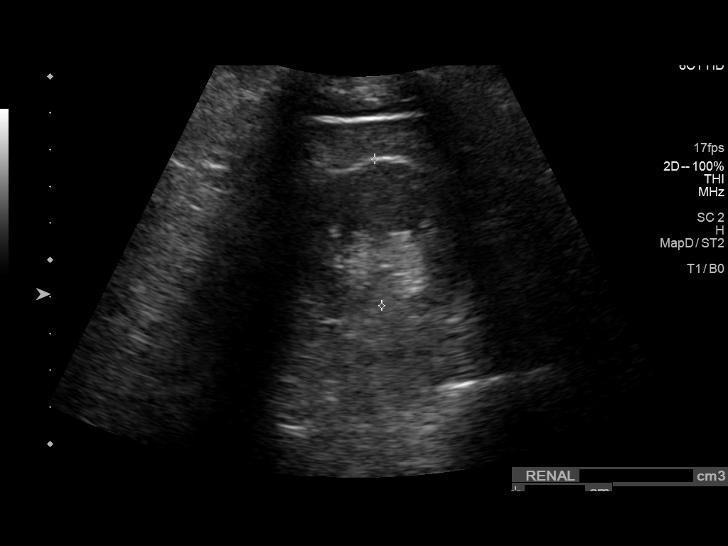
[im 16/30]
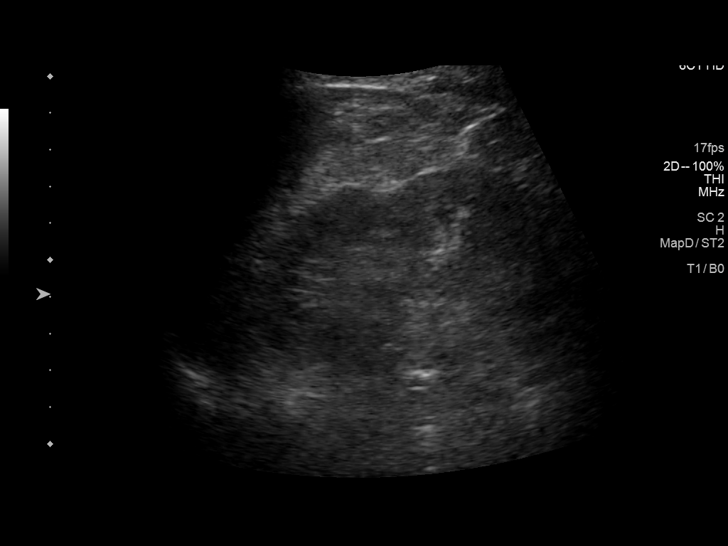
[im 19/30]
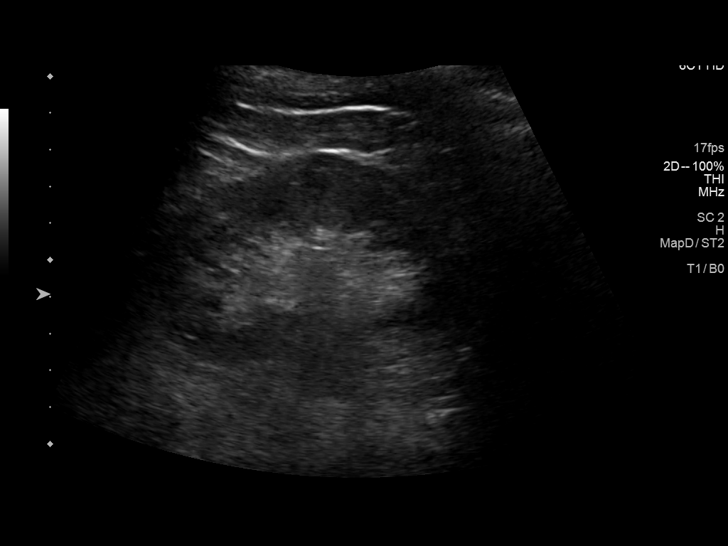
[im 20/30]
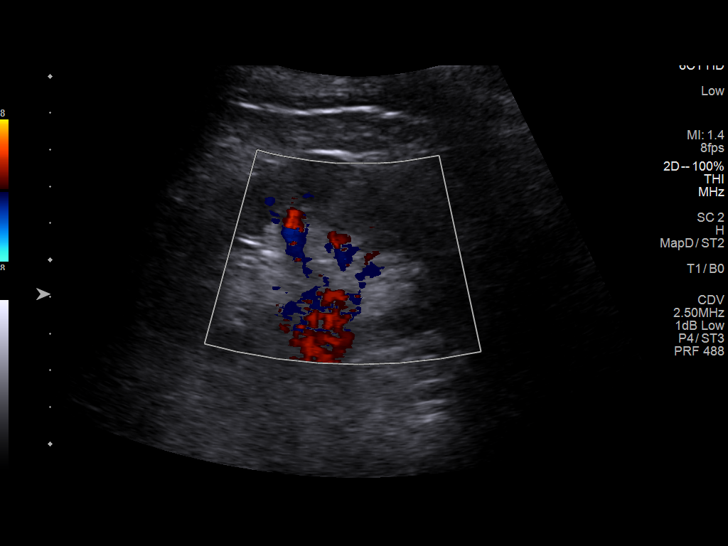
[im 22/30]
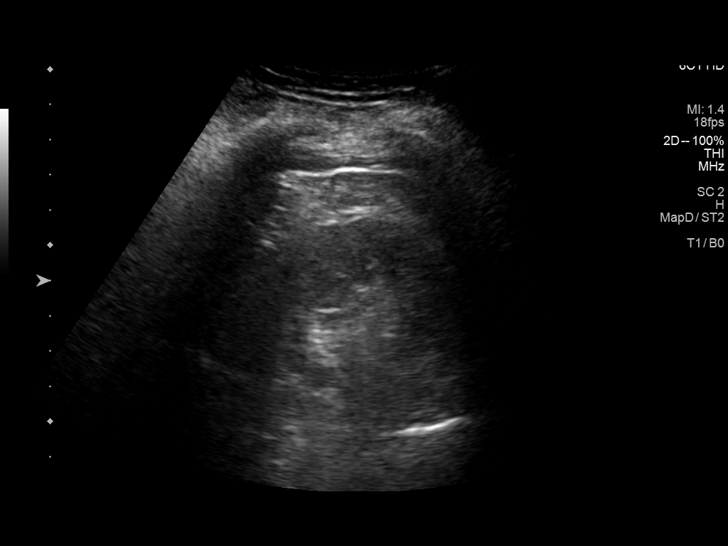
[im 25/30]
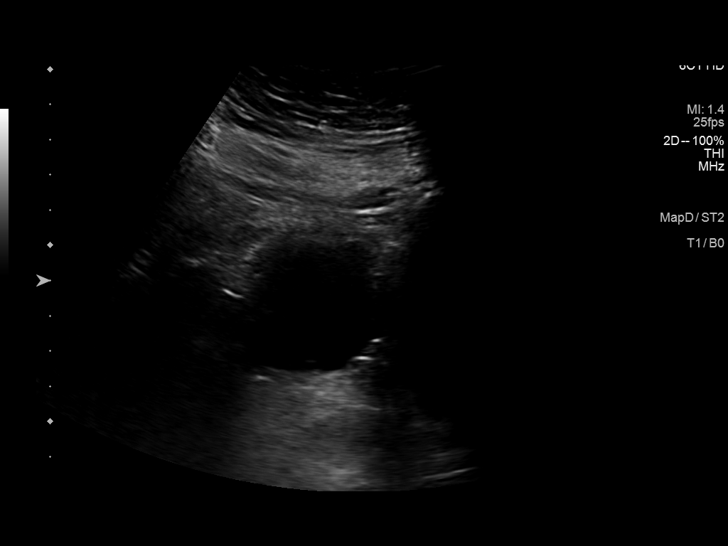
[im 27/30]
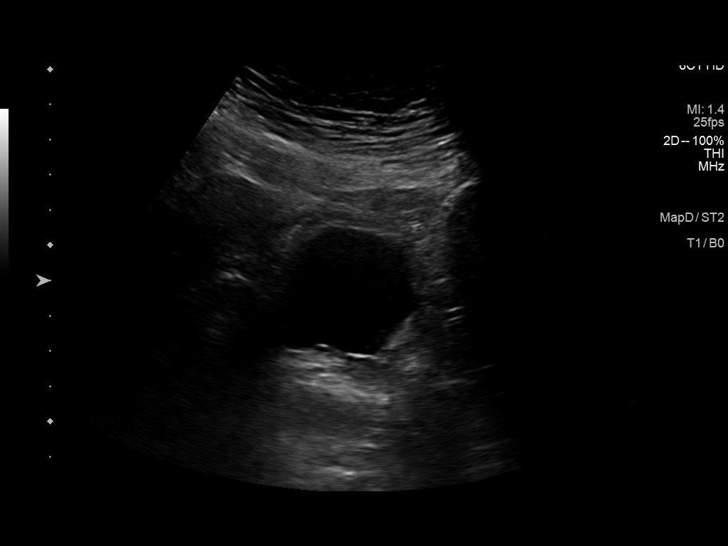
[im 30/30]
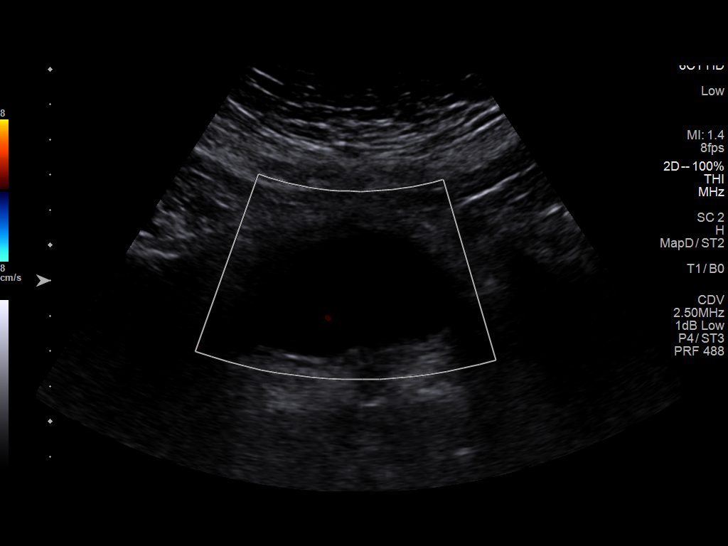

[14 of 25 positions shown; findings below may reference images not displayed]

FINDINGS: Right Kidney:

Renal measurements: 8.5 x 4.3 x 3.9 cm = volume: 74 mL. Renal
echogenicity within normal limits. No nephrolithiasis or
hydronephrosis. No focal renal mass.

Left Kidney:

Renal measurements: 9.0 x 5.4 x 4.0 cm = volume: 101 mL. Renal
echogenicity within normal limits. No nephrolithiasis or
hydronephrosis. No focal renal mass.

Bladder:

Appears normal for degree of bladder distention.

Other:

None.
IMPRESSION: Normal renal ultrasound. Renal echogenicity within normal limits. No
hydronephrosis.

## 2020-11-30 ENCOUNTER — Encounter: Payer: Self-pay | Admitting: Internal Medicine

## 2020-11-30 DIAGNOSIS — R921 Mammographic calcification found on diagnostic imaging of breast: Secondary | ICD-10-CM | POA: Diagnosis not present

## 2021-01-01 ENCOUNTER — Ambulatory Visit: Payer: Medicare Other | Admitting: Neurology

## 2021-02-10 ENCOUNTER — Other Ambulatory Visit: Payer: Self-pay | Admitting: Internal Medicine

## 2021-02-10 DIAGNOSIS — I10 Essential (primary) hypertension: Secondary | ICD-10-CM

## 2021-03-01 ENCOUNTER — Other Ambulatory Visit: Payer: Self-pay

## 2021-03-01 ENCOUNTER — Ambulatory Visit: Payer: BLUE CROSS/BLUE SHIELD | Admitting: Pharmacist

## 2021-03-01 ENCOUNTER — Telehealth: Payer: Self-pay

## 2021-03-01 DIAGNOSIS — G43109 Migraine with aura, not intractable, without status migrainosus: Secondary | ICD-10-CM

## 2021-03-01 DIAGNOSIS — I10 Essential (primary) hypertension: Secondary | ICD-10-CM

## 2021-03-01 DIAGNOSIS — N1831 Chronic kidney disease, stage 3a: Secondary | ICD-10-CM

## 2021-03-01 DIAGNOSIS — E785 Hyperlipidemia, unspecified: Secondary | ICD-10-CM

## 2021-03-01 NOTE — Progress Notes (Signed)
Chronic Care Management Pharmacy Note  03/01/2021 Name:  Jamie Villa MRN:  [id number] DOB:  December 10, 1945  Summary: -Pt is doing well overall. She had 1 migraine this year while on vacation, drinking wine. She does wish to have Ubrelvy on hand if needed.  Recommendations/Changes made from today's visit: -Submitted PA renewal for Roselyn Meier   Subjective: Jamie Villa is an 75 y.o. year old female who is a primary patient of [name], MD.  The CCM team was consulted for assistance with disease management and care coordination needs.    Engaged with patient by telephone for follow up visit in response to provider referral for pharmacy case management and/or care coordination services.   Consent to Services:  The patient was given information about Chronic Care Management services, agreed to services, and gave verbal consent prior to initiation of services.  Please see initial visit note for detailed documentation.   Patient Care Team: [name], MD as PCP - Elwin Sleight, MD as Referring Physician (Internal Medicine) Charlton Haws, [facility] as Pharmacist (Pharmacist) Alanda Slim Neena Rhymes, MD as Consulting Physician (Ophthalmology)   Pt reports she lives with her 2nd husband now. Her first husband of 19 years died 4 years ago. Her son has not spoken to her since she got remarried which causes her a lot of stress.   Recent office visits: 08/31/20 Dr Ronnald Ramp OV: chronic f/u; labs stable; ordered mammogram; no changes.  03/02/20 Dr Ronnald Ramp OV: BP overcontrolled, pt c/o dizziness, advised to stop furosemide and continue olmesartan.   01/21/20 Dr Ronnald Ramp OV: BP high, restart olmesartan 20 mg. rx'd Roselyn Meier for migraine.  Recent consult visits: 07/01/20 Dr Laverta Baltimore (BMT clinic [facility]): s/p BMT 11 years ago, stable. Changed Vitamin D to 50,000 IU weekly.   06/30/20 Dr Tomi Likens (neurology): f/u for migraine. Continue tylenol and Ubrelvy PRN. No med changes.   12/27/19 Dr Tomi Likens  (neurology): evaluation of "spells", most c/w with migraine w/ aura. Pt declines preventative.   Hospital visits: None in previous 6 months   Objective:  Lab Results  Component Value Date   CREATININE 0.97 08/31/2020   BUN 19 08/31/2020   GFR 56.78 (L) 08/31/2020   GFRNONAA 57 (L) 07/05/2018   GFRAA >60 07/05/2018   NA 144 08/31/2020   K 3.8 08/31/2020   CALCIUM 9.7 08/31/2020   CO2 31 08/31/2020   GLUCOSE 61 (L) 08/31/2020    Lab Results  Component Value Date/Time   HGBA1C 5.7 10/22/2019 04:31 PM   HGBA1C 6.0 03/18/2019 01:54 PM   GFR 56.78 (L) 08/31/2020 03:33 PM   GFR 48.33 (L) 11/28/2019 03:06 PM    Last diabetic Eye exam: No results found for: HMDIABEYEEXA  Last diabetic Foot exam: No results found for: HMDIABFOOTEX   Lab Results  Component Value Date   CHOL 155 08/31/2020   HDL 70.00 08/31/2020   LDLCALC 52 08/31/2020   LDLDIRECT 60.0 12/13/2017   TRIG 165.0 (H) 08/31/2020   CHOLHDL 2 08/31/2020    Hepatic Function Latest Ref Rng & Units 08/31/2020 10/22/2019 04/09/2018  Total Protein 6.0 - 8.3 g/dL 7.2 7.2 7.9  Albumin 3.5 - 5.2 g/dL 4.2 4.5 4.4  AST 0 - 37 U/L _0 ALT 0 - 35 U/L _1 Alk Phosphatase 39 - 117 U/L 77 83 68  Total Bilirubin 0.2 - 1.2 mg/dL 0.8 0.6 0.9  Bilirubin, Direct 0.0 - 0.3 mg/dL 0.1 0.1 -    Lab Results  Component Value Date/Time   TSH 3.39 11/28/2019 03:06 PM   TSH 3.71 03/18/2019 01:54 PM    CBC Latest Ref Rng & Units 08/31/2020 10/22/2019 03/18/2019  WBC 4.0 - 10.5 K/uL 6.9 7.6 6.6  Hemoglobin 12.0 - 15.0 g/dL 13.7 12.3 13.0  Hematocrit 36.0 - 46.0 % 40.5 36.4 38.9  Platelets 150.0 - 400.0 K/uL 200.0 238.0 228.0    Lab Results  Component Value Date/Time   VD25OH 47.80 10/22/2019 04:31 PM   VD25OH 55.24 03/18/2019 01:54 PM    Clinical ASCVD: No  The 10-year ASCVD risk score (Arnett DK, et al., 2019) is: 21.4%   Values used to calculate the score:     Age: 89 years     Sex: Female     Is Non-Hispanic African  American: No     Diabetic: No     Tobacco smoker: No     Systolic Blood Pressure: 127 mmHg     Is BP treated: Yes     HDL Cholesterol: 70 mg/dL     Total Cholesterol: 155 mg/dL    Depression screen [facility] 2/9 10/06/2020 05/03/2019 03/18/2019  Decreased Interest 0 0 0  Down, Depressed, Hopeless 0 0 1  PHQ - 2 Score 0 0 1  Altered sleeping - - 1  Tired, decreased energy - - 0  Change in appetite - - 1  Feeling bad or failure about yourself  - - 0  Trouble concentrating - - 0  Moving slowly or fidgety/restless - - 0  Suicidal thoughts - - 0  PHQ-9 Score - - 3  Difficult doing work/chores - - Not difficult at all     Social History   Tobacco Use  Smoking Status Never  Smokeless Tobacco Never   BP Readings from Last 3 Encounters:  10/06/20 122/70  08/31/20 130/84  06/30/20 (!) 150/86   Pulse Readings from Last 3 Encounters:  10/06/20 99  08/31/20 72  06/30/20 87   Wt Readings from Last 3 Encounters:  10/06/20 159 lb 6.4 oz (72.3 kg)  08/31/20 162 lb (73.5 kg)  06/30/20 162 lb 9.6 oz (73.8 kg)   BMI Readings from Last 3 Encounters:  10/06/20 25.73 kg/m  08/31/20 26.15 kg/m  06/30/20 26.24 kg/m    Assessment/Interventions: Review of patient past medical history, allergies, medications, health status, including review of consultants reports, laboratory and other test data, was performed as part of comprehensive evaluation and provision of chronic care management services.   SDOH:  (Social Determinants of Health) assessments and interventions performed: Yes  SDOH Screenings   Alcohol Screen: Low Risk    Last Alcohol Screening Score (AUDIT): 2  Depression (PHQ2-9): Low Risk    PHQ-2 Score: 0  Financial Resource Strain: Low Risk    Difficulty of Paying Living Expenses: Not very hard  Food Insecurity: No Food Insecurity   Worried About Charity fundraiser in the Last Year: Never true   Ran Out of Food in the Last Year: Never true  Housing: Low Risk    Last Housing  Risk Score: 0  Physical Activity: Sufficiently Active   Days of Exercise per Week: 5 days   Minutes of Exercise per Session: 30 min  Social Connections: Engineer, building services of Communication with Friends and Family: More than three times a week   Frequency of Social Gatherings with Friends and Family: More than three times a week   Attends Religious Services: More than 4 times per year   Active  Member of Clubs or Organizations: Yes   Attends Music therapist: More than 4 times per year   Marital Status: Married  Stress: No Stress Concern Present   Feeling of Stress : Not at all  Tobacco Use: Low Risk    Smoking Tobacco Use: Never   Smokeless Tobacco Use: Never  Transportation Needs: No Transportation Needs   Lack of Transportation (Medical): No   Lack of Transportation (Non-Medical): No    CCM Care Plan  Allergies  Allergen Reactions   Codeine Other (See Comments)    Unknown   Penicillins Hives and Other (See Comments)    DID THE REACTION INVOLVE: Swelling of the face/tongue/throat, SOB, or low BP? Unknown Sudden or severe rash/hives, skin peeling, or the inside of the mouth or nose? Unknown Did it require medical treatment? Unknown When did it last happen? Told all her life not to take If all above answers are "NO", may proceed with cephalosporin use.     Medications Reviewed Today     Reviewed by Charlton Haws, [facility] (Pharmacist) on 03/01/21 at 1323  Med List Status: <None>   Medication Order Taking? Sig Documenting Provider Last Dose Status Informant  Cholecalciferol 50 MCG (2000 UT) TABS [id number] Yes Take 1 tablet (2,000 Units total) by mouth daily. [name], MD Taking Active   furosemide (LASIX) 20 MG tablet [id number] Yes Take 20 mg by mouth daily. [provider] Taking Active   meclizine (ANTIVERT) 25 MG tablet [id number] Yes Take 25 mg by mouth 3 (three) times daily as needed for dizziness. [provider] Taking  Active   olmesartan (BENICAR) 20 MG tablet [id number] Yes TAKE 1 TABLET(20 MG) BY MOUTH DAILY [name], MD Taking Active   rosuvastatin (CRESTOR) 10 MG tablet [id number] Yes Take 1 tablet (10 mg total) by mouth daily. [name], MD Taking Active   Ubrogepant (UBRELVY) 50 MG TABS [id number] Yes Take 1 tablet by mouth daily as needed. [name], MD Taking Active             Patient Active Problem List   Diagnosis Date Noted   Seasonal allergic rhinitis due to pollen 10/06/2020   Migraine with aura and without status migrainosus, not intractable 01/21/2020   Hypothyroid    Chronic renal disease, stage 3, moderately decreased glomerular filtration rate (GFR) between 30-59 mL/min/1.73 square meter ([place]) 03/18/2019   Gallstones 07/12/2018   Current moderate episode of major depressive disorder without prior episode ([place]) 12/13/2017   Vasculitis ([place]) 04/07/2016   Diuretic-induced hypokalemia 05/21/2015   Vitamin D deficiency 05/20/2015   Visit for screening mammogram 06/04/2013   Dyslipidemia, goal LDL below 100 06/04/2013   Hyperglycemia 06/04/2013   Non-Hodgkin lymphoma ([place]) 01/18/2012   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[place]) 01/18/2012   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, face, and neck ([place]) 03/30/2010   Essential hypertension 03/30/2010    Immunization History  Administered Date(s) Administered   Influenza Split 05/16/2011, 04/03/2012   PFIZER(Purple Top)SARS-COV-2 Vaccination 11/07/2019, 12/02/2019   Pneumococcal Conjugate-13 04/29/2010   Pneumococcal Polysaccharide-23 05/08/2014   Td 06/20/2004, 12/05/2013   Zoster, Live 06/04/2013    Conditions to be addressed/monitored:  Hypertension, Hyperlipidemia, and Chronic Kidney Disease, Migraine  Care Plan : [place]  Updates made by Charlton Haws, [place] since 03/01/2021 12:00 AM     Problem: Hypertension, Hyperlipidemia, and Chronic Kidney Disease, Migraine    Priority: High     Long-Range Goal:  Disease management   Start Date: 03/01/2021  Expected End Date: 03/01/2022  This Visit's Progress: On track  Priority: High  Note:   Current Barriers:  Unable to independently afford treatment regimen Unable to independently monitor therapeutic efficacy  Pharmacist Clinical Goal(s):  Patient will verbalize ability to afford treatment regimen achieve adherence to monitoring guidelines and medication adherence to achieve therapeutic efficacy through collaboration with PharmD and provider.   Interventions: 1:1 collaboration with [name], MD regarding development and update of comprehensive plan of care as evidenced by provider attestation and co-signature Inter-disciplinary care team collaboration (see longitudinal plan of care) Comprehensive medication review performed; medication list updated in electronic medical record  Hypertension / CKD Stage 3a    BP goal is:  <130/80 Patient checks BP at home infrequently Patient home BP readings are ranging: n/a   Patient has failed these meds in the past: carvedilol, losartan-HCTZ, Bystolic, spironolactone Patient is currently controlled on the following medications:  Olmesartan 20 mg daily Furosemide 20 mg daily    We discussed: Pt endorses compliance with ARB; she reports she is taking furosemide daily and she denies s/sx of hypotension; she requests refill furosemide   Plan: Continue current medications    Hyperlipidemia    LDL goal < 100  Patient has failed these meds in past: n/a Patient is currently controlled on the following medications:  Rosuvastatin 10 mg daily   We discussed: pt endorses compliance with statin; she denies side effects   Plan: Continue current medications and control with diet and exercise   Migraine    Patient has failed these meds in past: n/a Patient is currently controlled on the following medications:  Ubrelvy 50 mg PRN   We discussed:  Pt has  used [place] samples a handful of times in past year, last use was in July 2022 on vacation after drinking wine. Pt would like to have this med on hand in case of future migraines; PA was approved last November but requires a new one this year.   Plan: Continue current medications; submit PA    Patient Goals/Self-Care Activities Patient will:  - take medications as prescribed focus on medication adherence by routine collaborate with provider on medication access solutions      Medication Assistance:  Roselyn Meier - PA initiated 03/01/21. Key: [id number]  Compliance/Adherence/Medication fill history: Care Gaps: Vaccines - Shingrix, covid booster, influenza --advised to get vaccines at local pharmacy; pt is not open to further vaccinations at this time  Star-Rating Drugs: Olmesartan - LF 12/04/20 x 73 ds Rosuvastatin - LF 11/10/20 x 90 ds  Patient's preferred pharmacy is:  [facility], [place] - [address] AT [address] [place] [address] Phone: [phone] Fax: [phone]  Uses pill box? No - prefers bottles Pt endorses 100% compliance  We discussed: Current pharmacy is preferred with insurance plan and patient is satisfied with pharmacy services Patient decided to: Continue current medication management strategy  Care Plan and Follow Up Patient Decision:  Patient agrees to Care Plan and Follow-up.  Plan: Telephone follow up appointment with care management team member scheduled for:  1 year  Charlene Brooke, PharmD, [place], CPP Clinical Pharmacist [facility] [phone]

## 2021-03-01 NOTE — Telephone Encounter (Signed)
Please advise as Jamie Villa with [employer] has called in regards to PA that was submitted today for the Ubrogepant (UBRELVY) 50 MG TABS and has stated that the PA was already done last month with a status of approval for this medication. She has stated this new PA will be discarded cause it will be a duplicate PA which is not needed!

## 2021-03-01 NOTE — Patient Instructions (Addendum)
Visit Information  Phone number for Pharmacist: [phone]   Goals Addressed             This Visit's Progress    Manage My Medicine       Timeframe:  Long-Range Goal Priority:  Medium Start Date:    03/01/21                         Expected End Date:     03/01/22                  Follow Up Date Sept 2023   - call for medicine refill 2 or 3 days before it runs out - call if I am sick and can't take my medicine - keep a list of all the medicines I take; vitamins and herbals too    Why is this important?   These steps will help you keep on track with your medicines.   Notes:         Care Plan : [place]  Updates made by Charlton Haws, RPH since 03/01/2021 12:00 AM     Problem: Hypertension, Hyperlipidemia, and Chronic Kidney Disease, Migraine   Priority: High     Long-Range Goal: Disease management   Start Date: 03/01/2021  Expected End Date: 03/01/2022  This Visit's Progress: On track  Priority: High  Note:   Current Barriers:  Unable to independently afford treatment regimen Unable to independently monitor therapeutic efficacy  Pharmacist Clinical Goal(s):  Patient will verbalize ability to afford treatment regimen achieve adherence to monitoring guidelines and medication adherence to achieve therapeutic efficacy through collaboration with PharmD and provider.   Interventions: 1:1 collaboration with [name], MD regarding development and update of comprehensive plan of care as evidenced by provider attestation and co-signature Inter-disciplinary care team collaboration (see longitudinal plan of care) Comprehensive medication review performed; medication list updated in electronic medical record  Hypertension / CKD Stage 3a    BP goal is:  <130/80 Patient checks BP at home infrequently Patient home BP readings are ranging: n/a   Patient has failed these meds in the past: carvedilol, losartan-HCTZ, Bystolic,  spironolactone Patient is currently controlled on the following medications:  Olmesartan 20 mg daily Furosemide 20 mg daily    We discussed: Pt endorses compliance with ARB; she reports she is taking furosemide daily and she denies s/sx of hypotension; she requests refill furosemide   Plan: Continue current medications    Hyperlipidemia    LDL goal < 100  Patient has failed these meds in past: n/a Patient is currently controlled on the following medications:  Rosuvastatin 10 mg daily   We discussed: pt endorses compliance with statin; she denies side effects   Plan: Continue current medications and control with diet and exercise   Migraine    Patient has failed these meds in past: n/a Patient is currently controlled on the following medications:  Ubrelvy 50 mg PRN   We discussed:  Pt has used [place] samples a handful of times in past year, last use was in July 2022 on vacation after drinking wine. Pt would like to have this med on hand in case of future migraines; PA was approved last November but requires a new one this year.   Plan: Continue current medications; submit PA    Patient Goals/Self-Care Activities Patient will:  - take medications as prescribed focus on medication adherence by routine collaborate with provider on medication access  solutions      The patient verbalized understanding of instructions, educational materials, and care plan provided today and declined offer to receive copy of patient instructions, educational materials, and care plan.  Telephone follow up appointment with pharmacy team member scheduled for: 1 year  Charlene Brooke, PharmD, [place], CPP Clinical Pharmacist [facility] [phone]

## 2021-03-01 NOTE — Telephone Encounter (Signed)
Noted  

## 2021-03-04 ENCOUNTER — Telehealth: Payer: Self-pay | Admitting: Pharmacist

## 2021-03-04 ENCOUNTER — Other Ambulatory Visit: Payer: Self-pay | Admitting: Internal Medicine

## 2021-03-04 DIAGNOSIS — G43109 Migraine with aura, not intractable, without status migrainosus: Secondary | ICD-10-CM

## 2021-03-04 DIAGNOSIS — I10 Essential (primary) hypertension: Secondary | ICD-10-CM

## 2021-03-04 MED ORDER — FUROSEMIDE 20 MG PO TABS: 20.0000 mg | ORAL_TABLET | Freq: Every day | ORAL | 0 refills | Status: DC

## 2021-03-04 NOTE — Telephone Encounter (Signed)
Patient called to request refill for furosemide 20 mg - 1 tablet daily. This was previously prescribed by Dr Carolin Sicks (nephrology) but patient reports she is not seeing him anymore and requests PCP take over prescribing.

## 2021-03-04 NOTE — Telephone Encounter (Signed)
   Cover My Meds calling with additional questions Ref key: [id number]  Phone [phone]

## 2021-03-04 NOTE — Telephone Encounter (Signed)
Contacted cover my meds. Informed them that [place] called last week and said PA was already approved. CMM will close out PA request. Nothing further needed.

## 2021-03-19 DIAGNOSIS — N1831 Chronic kidney disease, stage 3a: Secondary | ICD-10-CM

## 2021-03-19 DIAGNOSIS — I10 Essential (primary) hypertension: Secondary | ICD-10-CM | POA: Diagnosis not present

## 2021-03-19 DIAGNOSIS — E785 Hyperlipidemia, unspecified: Secondary | ICD-10-CM | POA: Diagnosis not present

## 2021-05-11 ENCOUNTER — Other Ambulatory Visit: Payer: Self-pay | Admitting: Internal Medicine

## 2021-05-11 DIAGNOSIS — E785 Hyperlipidemia, unspecified: Secondary | ICD-10-CM

## 2021-05-19 ENCOUNTER — Encounter: Payer: Self-pay | Admitting: Internal Medicine

## 2021-05-19 NOTE — Progress Notes (Signed)
Subjective:    Patient ID: Jamie Villa, female    DOB: 04/13/1946, 75 y.o.   MRN: [id number]  This visit occurred during the SARS-CoV-2 public health emergency.  Safety protocols were in place, including screening questions prior to the visit, additional usage of staff PPE, and extensive cleaning of exam room while observing appropriate contact time as indicated for disinfecting solutions.    HPI The patient is here for an acute visit.   Left leg pain and numbness   - she was leaving a restaurant and she had sudden onset left leg pain that was severe.  She was not able to put any weight on her leg.  It got worse as long as she stayed on it. She had a little swelling disal to her knee.   She went to ED- she was concerned about a blood clot - she had an [place] and it was neg.    The pain is just below the knee on the anterior, lateral, medial and posterior sides of her proximal lower leg.  She has been taking extra strength Tylenol and applying Aspercreme.  Years ago she did have a knee issue in the left knee and believes she had a cortisone shot.  She is not exactly sure what was wrong with it.  She has not had any issues with the knee since then.   Medications and allergies reviewed with patient and updated if appropriate.  Patient Active Problem List   Diagnosis Date Noted   Seasonal allergic rhinitis due to pollen 10/06/2020   Migraine with aura and without status migrainosus, not intractable 01/21/2020   Hypothyroid    Chronic renal disease, stage 3, moderately decreased glomerular filtration rate (GFR) between 30-59 mL/min/1.73 square meter ([place]) 03/18/2019   Gallstones 07/12/2018   Current moderate episode of major depressive disorder without prior episode ([place]) 12/13/2017   Vasculitis ([place]) 04/07/2016   Diuretic-induced hypokalemia 05/21/2015   Vitamin D deficiency 05/20/2015   Visit for screening mammogram 06/04/2013   Dyslipidemia, goal LDL below 100 06/04/2013    Hyperglycemia 06/04/2013   Non-Hodgkin lymphoma ([place]) 01/18/2012   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 90 tablet 1   furosemide (LASIX) 20 MG tablet Take 1 tablet (20 mg total) by mouth daily. 90 tablet 0   meclizine (ANTIVERT) 25 MG tablet Take 25 mg by mouth 3 (three) times daily as needed for dizziness.     olmesartan (BENICAR) 20 MG tablet TAKE 1 TABLET(20 MG) BY MOUTH DAILY 90 tablet 1   rosuvastatin (CRESTOR) 10 MG tablet TAKE 1 TABLET(10 MG) BY MOUTH DAILY 90 tablet 1   UBRELVY 50 MG TABS TAKE 1 TABLET BY MOUTH DAILY AS NEEDED 10 tablet 2   No current facility-administered medications on file prior to visit.    Past Medical History:  Diagnosis Date   Cancer ([place])    Large B cell Lymphoma   Complication of anesthesia    Difficult to arouse   Hiatal hernia    HTN (hypertension)    Hx of colonic polyps    Hyperlipidemia    Hypothyroid    Pneumonia    Healthcare-associated versus community-acquired pneumonia   PONV (  postoperative nausea and vomiting)     Past Surgical History:  Procedure Laterality Date   ABDOMINAL HYSTERECTOMY     CHOLECYSTECTOMY N/A 07/12/2018   Procedure: LAPAROSCOPIC CHOLECYSTECTOMY WITH INTRAOPERATIVE CHOLANGIOGRAM ERAS PATHWAY;  Surgeon: Jovita Kussmaul, MD;  Location: [facility];  Service: General;  Laterality: N/A;   COLONOSCOPY  11/2017   S/P STEM CELL TRANSPLANT     Large B-Cell lymphoma    Social History   Socioeconomic History   Marital status: Married    Spouse name: Not on file   Number of children: 2   Years of education: Not on file   Highest education level: Not on file  Occupational History   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   Social Determinants of Health   Financial Resource Strain: Not on file  Food Insecurity: No Food Insecurity   Worried About Charity fundraiser in the Last Year: Never true   Ran Out of Food in the Last Year: Never true  Transportation Needs: No Transportation Needs   Lack of Transportation (Medical): No   Lack of Transportation (Non-Medical): No  Physical Activity: Sufficiently Active   Days of Exercise per Week: 5 days   Minutes of Exercise per Session: 30 min  Stress: No Stress Concern Present   Feeling of Stress : Not at all  Social Connections: Socially Integrated   Frequency of Communication with Friends and Family: More than three times a week   Frequency of Social Gatherings with Friends and Family: More than three times a week   Attends Religious Services: More than 4 times per year   Active Member of Clubs or Organizations: Yes   Attends Music therapist: More than 4 times per year   Marital Status: Married    Family History  Problem Relation Age of Onset   Hypertension Other    Coronary artery disease Neg Hx     Review of Systems     Objective:   Vitals:   05/20/21 0957  BP: (!) 146/86  Pulse: 75  Temp: 98 F (36.7 C)  SpO2: 97%   BP Readings from Last 3 Encounters:  05/20/21 (!) 146/86  10/06/20 122/70  08/31/20 130/84   Wt Readings from Last 3 Encounters:  10/06/20 159 lb 6.4 oz (72.3 kg)  08/31/20 162 lb (73.5 kg)  06/30/20 162 lb 9.6 oz (73.8 kg)   Body mass  index is 25.73 kg/m.   Physical Exam Musculoskeletal:        General: Swelling (Left knee with mild effusion) and tenderness (No tenderness with outpatient left knee or proximal lower leg.  Slight pain with full flexion and applying pressure on the) present. No deformity.     Right lower leg: No edema.     Left lower leg: No edema.  Skin:    General: Skin is warm and dry.     Findings: No bruising or erythema.  Neurological:  Sensory: No sensory deficit.     Motor: No weakness.           Assessment & Plan:    Left knee pain: Acute Sudden onset of pain when walking Mild effusion on exam, tenderness with full flexion and applying weight Having significant difficulty walking Refer to orthopedics-she did request morphine [employer]air meloxicam 15 mg daily with food-discussed possibility of stomach upset/heartburn Advised to ice

## 2021-05-20 ENCOUNTER — Ambulatory Visit (INDEPENDENT_AMBULATORY_CARE_PROVIDER_SITE_OTHER): Payer: BLUE CROSS/BLUE SHIELD | Admitting: Internal Medicine

## 2021-05-20 ENCOUNTER — Other Ambulatory Visit: Payer: Self-pay

## 2021-05-20 ENCOUNTER — Other Ambulatory Visit: Payer: Self-pay | Admitting: Internal Medicine

## 2021-05-20 VITALS — BP 146/86 | HR 75 | Temp 98.0°F | Ht 66.0 in

## 2021-05-20 DIAGNOSIS — M25562 Pain in left knee: Secondary | ICD-10-CM

## 2021-05-20 MED ORDER — MELOXICAM 15 MG PO TABS: 15.0000 mg | ORAL_TABLET | Freq: Every day | ORAL | 0 refills | Status: DC

## 2021-05-20 NOTE — Patient Instructions (Addendum)
    Medications changes include :   meloxicam 15 mg daily with food  Your prescription(s) have been submitted to your pharmacy. Please take as directed and contact our office if you believe you are having problem(s) with the medication(s).   A referral was ordered for Dr Libby Maw.       Someone from their office will call you to schedule an appointment.

## 2021-05-24 DIAGNOSIS — M25562 Pain in left knee: Secondary | ICD-10-CM | POA: Diagnosis not present

## 2021-05-28 ENCOUNTER — Telehealth: Payer: Self-pay

## 2021-05-28 NOTE — Chronic Care Management (AMB) (Signed)
    Chronic Care Management Pharmacy Assistant   Name: Leda Bellefeuille  MRN: [id number] DOB: 24-Mar-1946   Reason for Encounter: Disease State-General Call    Recent office visits:  None ID  Recent consult visits:  None ID  Hospital visits:  None in previous 6 months  Medications: Outpatient Encounter Medications as of 05/28/2021  Medication Sig   Cholecalciferol 50 MCG (2000 UT) TABS Take 1 tablet (2,000 Units total) by mouth daily.   furosemide (LASIX) 20 MG tablet Take 1 tablet (20 mg total) by mouth daily.   meclizine (ANTIVERT) 25 MG tablet Take 25 mg by mouth 3 (three) times daily as needed for dizziness.   meloxicam (MOBIC) 15 MG tablet Take 1 tablet (15 mg total) by mouth daily.   olmesartan (BENICAR) 20 MG tablet TAKE 1 TABLET(20 MG) BY MOUTH DAILY   rosuvastatin (CRESTOR) 10 MG tablet TAKE 1 TABLET(10 MG) BY MOUTH DAILY   UBRELVY 50 MG TABS TAKE 1 TABLET BY MOUTH DAILY AS NEEDED   No facility-administered encounter medications on file as of 05/28/2021.   Have you had any problems recently with your health?Patient states that during the thanksgiving holiday she started having problems with her left leg. She stated that it began to swelling and was painful to walk on. She did say that Dr. Quay Burow sent her to the orthopedic clinic and that she is suppose to have an MRI on Friday. She said that sh is able to walk on it some but then it starts to hurt and she has to sit down.   Have you had any problems with your pharmacy?Patient states that sh is not having any problems with getting medications or the cost of medications from the pharmacy  What issues or side effects are you having with your medications?Patient states she is not having any side effects from medications  What would you like me to pass along to [facility] for them to help you with? Patient states that she is just waiting to have her MRI to find out the problem to why her leg is bothering her  What can  we do to take care of you better? Patient states that she does not need anything at this time  Care Gaps: Colonoscopy-12/06/17 Diabetic Foot Exam-NA Mammogram-NA Ophthalmology-NA Dexa Scan - NA Annual Well Visit - NA Micro albumin-NA Hemoglobin A1c- NA  Star Rating Drugs: Rosuvastatin 10 mg-last fill 05/11/21 90 ds Olmesartan 20 mg-last fill 05/10/21 90 ds  Ethelene Hal Clinical Pharmacist Assistant [phone]

## 2021-06-04 DIAGNOSIS — M25562 Pain in left knee: Secondary | ICD-10-CM | POA: Diagnosis not present

## 2021-06-07 DIAGNOSIS — M25562 Pain in left knee: Secondary | ICD-10-CM | POA: Diagnosis not present

## 2021-06-15 DIAGNOSIS — M25562 Pain in left knee: Secondary | ICD-10-CM | POA: Diagnosis not present

## 2021-06-16 ENCOUNTER — Other Ambulatory Visit: Payer: Self-pay | Admitting: Internal Medicine

## 2021-06-18 DIAGNOSIS — M25662 Stiffness of left knee, not elsewhere classified: Secondary | ICD-10-CM | POA: Diagnosis not present

## 2021-06-18 DIAGNOSIS — S83222D Peripheral tear of medial meniscus, current injury, left knee, subsequent encounter: Secondary | ICD-10-CM | POA: Diagnosis not present

## 2021-06-18 DIAGNOSIS — M6281 Muscle weakness (generalized): Secondary | ICD-10-CM | POA: Diagnosis not present

## 2021-06-18 DIAGNOSIS — R269 Unspecified abnormalities of gait and mobility: Secondary | ICD-10-CM | POA: Diagnosis not present

## 2021-06-22 DIAGNOSIS — M1712 Unilateral primary osteoarthritis, left knee: Secondary | ICD-10-CM | POA: Diagnosis not present

## 2021-06-29 ENCOUNTER — Telehealth: Payer: Self-pay | Admitting: Internal Medicine

## 2021-06-29 NOTE — Telephone Encounter (Signed)
Recv'd records from [facility] forwarded 2 pages to Dr. Scarlette Calico 1/10/23fbg

## 2021-07-07 DIAGNOSIS — M1712 Unilateral primary osteoarthritis, left knee: Secondary | ICD-10-CM | POA: Diagnosis not present

## 2021-07-07 DIAGNOSIS — S83242A Other tear of medial meniscus, current injury, left knee, initial encounter: Secondary | ICD-10-CM | POA: Diagnosis not present

## 2021-07-07 DIAGNOSIS — M13162 Monoarthritis, not elsewhere classified, left knee: Secondary | ICD-10-CM | POA: Diagnosis not present

## 2021-07-07 DIAGNOSIS — S83232A Complex tear of medial meniscus, current injury, left knee, initial encounter: Secondary | ICD-10-CM | POA: Diagnosis not present

## 2021-07-07 DIAGNOSIS — G8918 Other acute postprocedural pain: Secondary | ICD-10-CM | POA: Diagnosis not present

## 2021-07-19 DIAGNOSIS — M6281 Muscle weakness (generalized): Secondary | ICD-10-CM | POA: Diagnosis not present

## 2021-07-19 DIAGNOSIS — M25662 Stiffness of left knee, not elsewhere classified: Secondary | ICD-10-CM | POA: Diagnosis not present

## 2021-07-19 DIAGNOSIS — R269 Unspecified abnormalities of gait and mobility: Secondary | ICD-10-CM | POA: Diagnosis not present

## 2021-07-19 DIAGNOSIS — S83222D Peripheral tear of medial meniscus, current injury, left knee, subsequent encounter: Secondary | ICD-10-CM | POA: Diagnosis not present

## 2021-07-26 DIAGNOSIS — M6281 Muscle weakness (generalized): Secondary | ICD-10-CM | POA: Diagnosis not present

## 2021-07-26 DIAGNOSIS — S83222D Peripheral tear of medial meniscus, current injury, left knee, subsequent encounter: Secondary | ICD-10-CM | POA: Diagnosis not present

## 2021-07-26 DIAGNOSIS — M25662 Stiffness of left knee, not elsewhere classified: Secondary | ICD-10-CM | POA: Diagnosis not present

## 2021-07-26 DIAGNOSIS — R269 Unspecified abnormalities of gait and mobility: Secondary | ICD-10-CM | POA: Diagnosis not present

## 2021-07-29 DIAGNOSIS — R269 Unspecified abnormalities of gait and mobility: Secondary | ICD-10-CM | POA: Diagnosis not present

## 2021-07-29 DIAGNOSIS — S83222D Peripheral tear of medial meniscus, current injury, left knee, subsequent encounter: Secondary | ICD-10-CM | POA: Diagnosis not present

## 2021-07-29 DIAGNOSIS — M25662 Stiffness of left knee, not elsewhere classified: Secondary | ICD-10-CM | POA: Diagnosis not present

## 2021-07-29 DIAGNOSIS — M6281 Muscle weakness (generalized): Secondary | ICD-10-CM | POA: Diagnosis not present

## 2021-08-02 DIAGNOSIS — M6281 Muscle weakness (generalized): Secondary | ICD-10-CM | POA: Diagnosis not present

## 2021-08-02 DIAGNOSIS — M25662 Stiffness of left knee, not elsewhere classified: Secondary | ICD-10-CM | POA: Diagnosis not present

## 2021-08-02 DIAGNOSIS — R269 Unspecified abnormalities of gait and mobility: Secondary | ICD-10-CM | POA: Diagnosis not present

## 2021-08-02 DIAGNOSIS — S83222D Peripheral tear of medial meniscus, current injury, left knee, subsequent encounter: Secondary | ICD-10-CM | POA: Diagnosis not present

## 2021-08-04 DIAGNOSIS — M6281 Muscle weakness (generalized): Secondary | ICD-10-CM | POA: Diagnosis not present

## 2021-08-04 DIAGNOSIS — R269 Unspecified abnormalities of gait and mobility: Secondary | ICD-10-CM | POA: Diagnosis not present

## 2021-08-04 DIAGNOSIS — M25662 Stiffness of left knee, not elsewhere classified: Secondary | ICD-10-CM | POA: Diagnosis not present

## 2021-08-04 DIAGNOSIS — S83222D Peripheral tear of medial meniscus, current injury, left knee, subsequent encounter: Secondary | ICD-10-CM | POA: Diagnosis not present

## 2021-08-05 ENCOUNTER — Other Ambulatory Visit: Payer: Self-pay | Admitting: Internal Medicine

## 2021-08-05 DIAGNOSIS — I10 Essential (primary) hypertension: Secondary | ICD-10-CM

## 2021-08-09 DIAGNOSIS — M25662 Stiffness of left knee, not elsewhere classified: Secondary | ICD-10-CM | POA: Diagnosis not present

## 2021-08-09 DIAGNOSIS — S83222D Peripheral tear of medial meniscus, current injury, left knee, subsequent encounter: Secondary | ICD-10-CM | POA: Diagnosis not present

## 2021-08-09 DIAGNOSIS — R269 Unspecified abnormalities of gait and mobility: Secondary | ICD-10-CM | POA: Diagnosis not present

## 2021-08-09 DIAGNOSIS — M6281 Muscle weakness (generalized): Secondary | ICD-10-CM | POA: Diagnosis not present

## 2021-08-11 DIAGNOSIS — M25662 Stiffness of left knee, not elsewhere classified: Secondary | ICD-10-CM | POA: Diagnosis not present

## 2021-08-11 DIAGNOSIS — S83222D Peripheral tear of medial meniscus, current injury, left knee, subsequent encounter: Secondary | ICD-10-CM | POA: Diagnosis not present

## 2021-08-11 DIAGNOSIS — R269 Unspecified abnormalities of gait and mobility: Secondary | ICD-10-CM | POA: Diagnosis not present

## 2021-08-11 DIAGNOSIS — M6281 Muscle weakness (generalized): Secondary | ICD-10-CM | POA: Diagnosis not present

## 2021-08-16 DIAGNOSIS — S83222D Peripheral tear of medial meniscus, current injury, left knee, subsequent encounter: Secondary | ICD-10-CM | POA: Diagnosis not present

## 2021-08-16 DIAGNOSIS — R269 Unspecified abnormalities of gait and mobility: Secondary | ICD-10-CM | POA: Diagnosis not present

## 2021-08-16 DIAGNOSIS — M25662 Stiffness of left knee, not elsewhere classified: Secondary | ICD-10-CM | POA: Diagnosis not present

## 2021-08-16 DIAGNOSIS — M6281 Muscle weakness (generalized): Secondary | ICD-10-CM | POA: Diagnosis not present

## 2021-08-18 DIAGNOSIS — M25662 Stiffness of left knee, not elsewhere classified: Secondary | ICD-10-CM | POA: Diagnosis not present

## 2021-08-18 DIAGNOSIS — R269 Unspecified abnormalities of gait and mobility: Secondary | ICD-10-CM | POA: Diagnosis not present

## 2021-08-18 DIAGNOSIS — M6281 Muscle weakness (generalized): Secondary | ICD-10-CM | POA: Diagnosis not present

## 2021-08-18 DIAGNOSIS — S83222D Peripheral tear of medial meniscus, current injury, left knee, subsequent encounter: Secondary | ICD-10-CM | POA: Diagnosis not present

## 2021-08-30 DIAGNOSIS — M1712 Unilateral primary osteoarthritis, left knee: Secondary | ICD-10-CM | POA: Diagnosis not present

## 2021-08-30 DIAGNOSIS — M25462 Effusion, left knee: Secondary | ICD-10-CM | POA: Diagnosis not present

## 2021-08-30 DIAGNOSIS — M25562 Pain in left knee: Secondary | ICD-10-CM | POA: Diagnosis not present

## 2021-09-13 DIAGNOSIS — M1712 Unilateral primary osteoarthritis, left knee: Secondary | ICD-10-CM | POA: Diagnosis not present

## 2021-09-21 ENCOUNTER — Encounter: Payer: Self-pay | Admitting: Internal Medicine

## 2021-09-21 ENCOUNTER — Ambulatory Visit (INDEPENDENT_AMBULATORY_CARE_PROVIDER_SITE_OTHER): Payer: Medicare Other | Admitting: Internal Medicine

## 2021-09-21 VITALS — BP 136/84 | HR 95 | Temp 98.1°F | Ht 66.0 in | Wt 162.0 lb

## 2021-09-21 DIAGNOSIS — E039 Hypothyroidism, unspecified: Secondary | ICD-10-CM

## 2021-09-21 DIAGNOSIS — N1831 Chronic kidney disease, stage 3a: Secondary | ICD-10-CM | POA: Diagnosis not present

## 2021-09-21 DIAGNOSIS — I10 Essential (primary) hypertension: Secondary | ICD-10-CM

## 2021-09-21 LAB — CBC WITH DIFFERENTIAL/PLATELET
Basophils Absolute: 0 10*3/uL (ref 0.0–0.1)
Basophils Relative: 0.3 % (ref 0.0–3.0)
Eosinophils Absolute: 0 10*3/uL (ref 0.0–0.7)
Eosinophils Relative: 0.8 % (ref 0.0–5.0)
HCT: 41 % (ref 36.0–46.0)
Hemoglobin: 13.8 g/dL (ref 12.0–15.0)
Lymphocytes Relative: 31.5 % (ref 12.0–46.0)
Lymphs Abs: 1.7 10*3/uL (ref 0.7–4.0)
MCHC: 33.6 g/dL (ref 30.0–36.0)
MCV: 98.1 fl (ref 78.0–100.0)
Monocytes Absolute: 0.5 10*3/uL (ref 0.1–1.0)
Monocytes Relative: 9.8 % (ref 3.0–12.0)
Neutro Abs: 3.1 10*3/uL (ref 1.4–7.7)
Neutrophils Relative %: 57.6 % (ref 43.0–77.0)
Platelets: 166 10*3/uL (ref 150.0–400.0)
RBC: 4.18 Mil/uL (ref 3.87–5.11)
RDW: 13.6 % (ref 11.5–15.5)
WBC: 5.4 10*3/uL (ref 4.0–10.5)

## 2021-09-21 LAB — HEPATIC FUNCTION PANEL
ALT: 32 U/L (ref 0–35)
AST: 28 U/L (ref 0–37)
Albumin: 4.5 g/dL (ref 3.5–5.2)
Alkaline Phosphatase: 56 U/L (ref 39–117)
Bilirubin, Direct: 0.2 mg/dL (ref 0.0–0.3)
Total Bilirubin: 0.8 mg/dL (ref 0.2–1.2)
Total Protein: 7 g/dL (ref 6.0–8.3)

## 2021-09-21 LAB — BASIC METABOLIC PANEL
BUN: 21 mg/dL (ref 6–23)
CO2: 30 mEq/L (ref 19–32)
Calcium: 9.9 mg/dL (ref 8.4–10.5)
Chloride: 106 mEq/L (ref 96–112)
Creatinine, Ser: 1.03 mg/dL (ref 0.40–1.20)
GFR: 52.44 mL/min — ABNORMAL LOW (ref >60.00)
Glucose, Bld: 82 mg/dL (ref 70–99)
Potassium: 3.8 mEq/L (ref 3.5–5.1)
Sodium: 143 mEq/L (ref 135–145)

## 2021-09-21 LAB — TSH: TSH: 4.22 u[IU]/mL (ref 0.35–5.50)

## 2021-09-21 MED ORDER — FUROSEMIDE 20 MG PO TABS: 20.0000 mg | ORAL_TABLET | Freq: Every day | ORAL | 0 refills | Status: DC

## 2021-09-21 NOTE — Progress Notes (Signed)
? ?Subjective:  ?Patient ID: Jamie Villa, female    DOB: [birth year]  Age: 76 y.o. MRN: [id number] ? ?CC: Hypertension ? ? ?HPI ?Doristine Mango Gruner presents for f/up- ? ?She is very active and denies DOE, CP, SOB, edema , or fatigue. ? ?Outpatient Medications Prior to Visit  ?Medication Sig Dispense Refill  ? Cholecalciferol 50 MCG (2000 UT) TABS Take 1 tablet (2,000 Units total) by mouth daily. 90 tablet 1  ? meclizine (ANTIVERT) 25 MG tablet Take 25 mg by mouth 3 (three) times daily as needed for dizziness.    ? olmesartan (BENICAR) 20 MG tablet TAKE 1 TABLET(20 MG) BY MOUTH DAILY 90 tablet 1  ? rosuvastatin (CRESTOR) 10 MG tablet TAKE 1 TABLET(10 MG) BY MOUTH DAILY 90 tablet 1  ? UBRELVY 50 MG TABS TAKE 1 TABLET BY MOUTH DAILY AS NEEDED 10 tablet 2  ? furosemide (LASIX) 20 MG tablet Take 1 tablet (20 mg total) by mouth daily. 90 tablet 0  ? meloxicam (MOBIC) 15 MG tablet TAKE 1 TABLET(15 MG) BY MOUTH DAILY 90 tablet 1  ? ?No facility-administered medications prior to visit.  ? ? ?ROS ?Review of Systems  ?Constitutional:  Negative for diaphoresis and fatigue.  ?HENT: Negative.    ?Eyes: Negative.   ?Respiratory:  Negative for cough, chest tightness, shortness of breath and wheezing.   ?Cardiovascular:  Negative for chest pain, palpitations and leg swelling.  ?Gastrointestinal:  Negative for abdominal pain, constipation, diarrhea, nausea and vomiting.  ?Endocrine: Negative.   ?Genitourinary: Negative.  Negative for difficulty urinating.  ?Musculoskeletal: Negative.  Negative for arthralgias and myalgias.  ?Skin: Negative.   ?Neurological:  Negative for dizziness, weakness and headaches.  ?Hematological:  Negative for adenopathy. Does not bruise/bleed easily.  ?Psychiatric/Behavioral: Negative.    ? ?Objective:  ?BP 136/84 (BP Location: Left Arm, Patient Position: Sitting, Cuff Size: Large)   Pulse 95   Temp 98.1 ?F (36.7 ?C) (Oral)   Ht '5\' 6"'$  (1.676 m)   Wt 162 lb (73.5 kg)   SpO2 96%   BMI 26.15 kg/m?   ? ?BP Readings from Last 3 Encounters:  ?09/21/21 136/84  ?05/20/21 (!) 146/86  ?10/06/20 122/70  ? ? ?Wt Readings from Last 3 Encounters:  ?09/21/21 162 lb (73.5 kg)  ?10/06/20 159 lb 6.4 oz (72.3 kg)  ?08/31/20 162 lb (73.5 kg)  ? ? ?Physical Exam ?Vitals reviewed.  ?HENT:  ?   Nose: Nose normal.  ?   Mouth/Throat:  ?   Mouth: Mucous membranes are moist.  ?Eyes:  ?   General: No scleral icterus. ?   Conjunctiva/sclera: Conjunctivae normal.  ?Cardiovascular:  ?   Rate and Rhythm: Normal rate and regular rhythm.  ?   Heart sounds: Normal heart sounds, S1 normal and S2 normal. No murmur heard. ?   Comments: EKG- ?NSR, 89 bpm ?RBBB, LAFB - unchanged ?No Q waves ? ?Pulmonary:  ?   Effort: Pulmonary effort is normal.  ?   Breath sounds: No stridor. No wheezing, rhonchi or rales.  ?Abdominal:  ?   General: Abdomen is flat.  ?   Palpations: There is no mass.  ?   Tenderness: There is no abdominal tenderness. There is no guarding.  ?   Hernia: No hernia is present.  ?Musculoskeletal:  ?   Right lower leg: No edema.  ?   Left lower leg: No edema.  ?Skin: ?   General: Skin is warm and dry.  ?   Findings: No rash.  ?  Neurological:  ?   General: No focal deficit present.  ?Psychiatric:     ?   Mood and Affect: Mood normal.     ?   Behavior: Behavior normal.  ? ? ?Lab Results  ?Component Value Date  ? WBC 5.4 09/21/2021  ? HGB 13.8 09/21/2021  ? HCT 41.0 09/21/2021  ? PLT 166.0 09/21/2021  ? GLUCOSE 82 09/21/2021  ? CHOL 155 08/31/2020  ? TRIG 165.0 (H) 08/31/2020  ? HDL 70.00 08/31/2020  ? LDLDIRECT 60.0 12/13/2017  ? [place] 52 08/31/2020  ? ALT 32 09/21/2021  ? AST 28 09/21/2021  ? NA 143 09/21/2021  ? K 3.8 09/21/2021  ? CL 106 09/21/2021  ? CREATININE 1.03 09/21/2021  ? BUN 21 09/21/2021  ? CO2 30 09/21/2021  ? TSH 4.22 09/21/2021  ? INR 0.90 10/14/2009  ? HGBA1C 5.7 10/22/2019  ? ? ?US RENAL ? ?Result Date: 11/20/2019 ?CLINICAL DATA:  Initial evaluation for chronic kidney disease, stage III. EXAM: RENAL / URINARY TRACT  ULTRASOUND COMPLETE COMPARISON:  Prior ultrasound from 03/20/2018. FINDINGS: Right Kidney: Renal measurements: 8.5 x 4.3 x 3.9 cm = volume: 74 mL. Renal echogenicity within normal limits. No nephrolithiasis or hydronephrosis. No focal renal mass. Left Kidney: Renal measurements: 9.0 x 5.4 x 4.0 cm = volume: 101 mL. Renal echogenicity within normal limits. No nephrolithiasis or hydronephrosis. No focal renal mass. Bladder: Appears normal for degree of bladder distention. Other: None. IMPRESSION: Normal renal ultrasound. Renal echogenicity within normal limits. No hydronephrosis. Electronically Signed   By: Jeannine Boga M.D.   On: 11/20/2019 20:52  ? ? ?Assessment & Plan:  ? ?Jhane was seen today for hypertension. ? ?Diagnoses and all orders for this visit: ? ?Essential hypertension- Her blood pressure is adequately well controlled.  Electrolytes and renal function are okay. ?-     Basic metabolic panel; Future ?-     CBC with Differential/Platelet; Future ?-     Hepatic function panel; Future ?-     furosemide (LASIX) 20 MG tablet; Take 1 tablet (20 mg total) by mouth daily. ?-     EKG 12-Lead ?-     Hepatic function panel ?-     CBC with Differential/Platelet ?-     Basic metabolic panel ? ?Hypothyroidism, unspecified type- She is euthyroid and her TSH is normal.  Thyroid replacement therapy is not indicated. ?-     TSH; Future ?-     Hepatic function panel; Future ?-     Hepatic function panel ?-     TSH ? ?Stage 3a chronic kidney disease ([place])- Her renal function is stable.  She will avoid nephrotoxic agents. ?-     Basic metabolic panel; Future ?-     Basic metabolic panel ? ? ?I have discontinued Jomarie Longs. Villatoro's meloxicam. I am also having her maintain her Cholecalciferol, meclizine, Ubrelvy, rosuvastatin, olmesartan, and furosemide. ? ?Meds ordered this encounter  ?Medications  ? furosemide (LASIX) 20 MG tablet  ?  Sig: Take 1 tablet (20 mg total) by mouth daily.  ?  Dispense:  90 tablet  ?  Refill:  0   ? ? ? ?Follow-up: Return in about 6 months (around 03/23/2022). ? ?Scarlette Calico, MD ?

## 2021-09-21 NOTE — Patient Instructions (Signed)

## 2021-10-05 ENCOUNTER — Telehealth: Payer: Self-pay | Admitting: Internal Medicine

## 2021-10-05 NOTE — Telephone Encounter (Signed)
N/A unable to leave a message for patient to call back to schedule Medicare Annual Wellness Visit  ? ?Last AWV  10/06/21 ? ?Please schedule at anytime with [facility] if patient calls the office back.   ? ? ?Any questions, please call me at [phone]  ?

## 2021-10-07 ENCOUNTER — Telehealth: Payer: Self-pay

## 2021-10-07 NOTE — Progress Notes (Signed)
? ? ?  Chronic Care Management ?Pharmacy Assistant  ? ?Name: Jamie Villa  MRN: [id number] DOB: 07-26-1945 ? ?Reason for Encounter: Disease State-General ?  ? ?Recent office visits:  ?09/21/21 [name], MD-PCP (Hypertension) Labs ordered; Med changes: discontinue meloxicam ? ?Recent consult visits:  ?None since last coordination call ? ?Hospital visits:  ?None in previous 6 months ? ?Medications: ?Outpatient Encounter Medications as of 10/07/2021  ?Medication Sig  ? Cholecalciferol 50 MCG (2000 UT) TABS Take 1 tablet (2,000 Units total) by mouth daily.  ? furosemide (LASIX) 20 MG tablet Take 1 tablet (20 mg total) by mouth daily.  ? meclizine (ANTIVERT) 25 MG tablet Take 25 mg by mouth 3 (three) times daily as needed for dizziness.  ? olmesartan (BENICAR) 20 MG tablet TAKE 1 TABLET(20 MG) BY MOUTH DAILY  ? rosuvastatin (CRESTOR) 10 MG tablet TAKE 1 TABLET(10 MG) BY MOUTH DAILY  ? UBRELVY 50 MG TABS TAKE 1 TABLET BY MOUTH DAILY AS NEEDED  ? ?No facility-administered encounter medications on file as of 10/07/2021.  ? ?Have you had any problems recently with your health?Patient states that she is doing well. She stated that she is having knee surgery soon. She states that she has not had any changes in her medications ? ?Have you had any problems with your pharmacy?Patient states that she does not have any problems with getting medications or the cost of medications from the pharmacy ? ?What issues or side effects are you having with your medications?Patient states that she does not have any side effects from medications ? ?What would you like me to pass along to [facility] for them to help you with?Patient states that she does not have any concerns about health or medications  ? ?What can we do to take care of you better? Patient states that she does not need anything at this time ? ?Care Gaps: ?Colonoscopy-12/06/17 ?Diabetic Foot Exam-NA ?Mammogram-NA ?Ophthalmology-NA ?Dexa Scan - NA ?Annual Well Visit -  NA ?Micro albumin-NA ?Hemoglobin A1c- NA ?  ?Star Rating Drugs: ?Rosuvastatin 10 mg-last fill 08/13/21 90 ds ?Olmesartan 20 mg-last fill 08/05/21 90 ds ?  ?Ethelene Hal ?Clinical Pharmacist Assistant ?[phone]  ? ?

## 2021-10-25 DIAGNOSIS — M6281 Muscle weakness (generalized): Secondary | ICD-10-CM | POA: Diagnosis not present

## 2021-10-25 DIAGNOSIS — M25662 Stiffness of left knee, not elsewhere classified: Secondary | ICD-10-CM | POA: Diagnosis not present

## 2021-10-25 DIAGNOSIS — M1712 Unilateral primary osteoarthritis, left knee: Secondary | ICD-10-CM | POA: Diagnosis not present

## 2021-10-25 DIAGNOSIS — R262 Difficulty in walking, not elsewhere classified: Secondary | ICD-10-CM | POA: Diagnosis not present

## 2021-10-27 DIAGNOSIS — Z96652 Presence of left artificial knee joint: Secondary | ICD-10-CM | POA: Diagnosis not present

## 2021-10-27 DIAGNOSIS — G8918 Other acute postprocedural pain: Secondary | ICD-10-CM | POA: Diagnosis not present

## 2021-10-27 DIAGNOSIS — M1712 Unilateral primary osteoarthritis, left knee: Secondary | ICD-10-CM | POA: Diagnosis not present

## 2021-10-28 DIAGNOSIS — I10 Essential (primary) hypertension: Secondary | ICD-10-CM | POA: Diagnosis not present

## 2021-10-28 DIAGNOSIS — E785 Hyperlipidemia, unspecified: Secondary | ICD-10-CM | POA: Diagnosis not present

## 2021-10-28 DIAGNOSIS — E039 Hypothyroidism, unspecified: Secondary | ICD-10-CM | POA: Diagnosis not present

## 2021-10-28 DIAGNOSIS — Z471 Aftercare following joint replacement surgery: Secondary | ICD-10-CM | POA: Diagnosis not present

## 2021-10-28 DIAGNOSIS — J45909 Unspecified asthma, uncomplicated: Secondary | ICD-10-CM | POA: Diagnosis not present

## 2021-10-28 DIAGNOSIS — Z8701 Personal history of pneumonia (recurrent): Secondary | ICD-10-CM | POA: Diagnosis not present

## 2021-10-28 DIAGNOSIS — Z7982 Long term (current) use of aspirin: Secondary | ICD-10-CM | POA: Diagnosis not present

## 2021-10-28 DIAGNOSIS — H9190 Unspecified hearing loss, unspecified ear: Secondary | ICD-10-CM | POA: Diagnosis not present

## 2021-10-28 DIAGNOSIS — K649 Unspecified hemorrhoids: Secondary | ICD-10-CM | POA: Diagnosis not present

## 2021-10-28 DIAGNOSIS — Z96652 Presence of left artificial knee joint: Secondary | ICD-10-CM | POA: Diagnosis not present

## 2021-10-28 DIAGNOSIS — H269 Unspecified cataract: Secondary | ICD-10-CM | POA: Diagnosis not present

## 2021-10-28 DIAGNOSIS — D649 Anemia, unspecified: Secondary | ICD-10-CM | POA: Diagnosis not present

## 2021-10-28 DIAGNOSIS — K449 Diaphragmatic hernia without obstruction or gangrene: Secondary | ICD-10-CM | POA: Diagnosis not present

## 2021-10-28 DIAGNOSIS — G43909 Migraine, unspecified, not intractable, without status migrainosus: Secondary | ICD-10-CM | POA: Diagnosis not present

## 2021-11-01 ENCOUNTER — Emergency Department (HOSPITAL_COMMUNITY): Payer: Medicare Other

## 2021-11-01 ENCOUNTER — Emergency Department (HOSPITAL_COMMUNITY)
Admission: EM | Admit: 2021-11-01 | Discharge: 2021-11-01 | Disposition: A | Discharge status: Home / Self Care | Payer: Medicare Other | Attending: Emergency Medicine | Admitting: Emergency Medicine

## 2021-11-01 ENCOUNTER — Other Ambulatory Visit: Payer: Self-pay

## 2021-11-01 ENCOUNTER — Encounter (HOSPITAL_COMMUNITY): Payer: Self-pay | Admitting: Radiology

## 2021-11-01 DIAGNOSIS — M25562 Pain in left knee: Secondary | ICD-10-CM | POA: Diagnosis not present

## 2021-11-01 DIAGNOSIS — I10 Essential (primary) hypertension: Secondary | ICD-10-CM | POA: Diagnosis not present

## 2021-11-01 DIAGNOSIS — G8918 Other acute postprocedural pain: Secondary | ICD-10-CM | POA: Insufficient documentation

## 2021-11-01 LAB — CBC WITH DIFFERENTIAL/PLATELET
Abs Immature Granulocytes: 0.02 10*3/uL (ref 0.00–0.07)
Basophils Absolute: 0 10*3/uL (ref 0.0–0.1)
Basophils Relative: 0 %
Eosinophils Absolute: 0.1 10*3/uL (ref 0.0–0.5)
Eosinophils Relative: 1 %
HCT: 34.3 % — ABNORMAL LOW (ref 36.0–46.0)
Hemoglobin: 11.8 g/dL — ABNORMAL LOW (ref 12.0–15.0)
Immature Granulocytes: 0 %
Lymphocytes Relative: 28 %
Lymphs Abs: 1.9 10*3/uL (ref 0.7–4.0)
MCH: 34.2 pg — ABNORMAL HIGH (ref 26.0–34.0)
MCHC: 34.4 g/dL (ref 30.0–36.0)
MCV: 99.4 fL (ref 80.0–100.0)
Monocytes Absolute: 0.9 10*3/uL (ref 0.1–1.0)
Monocytes Relative: 13 %
Neutro Abs: 4 10*3/uL (ref 1.7–7.7)
Neutrophils Relative %: 58 %
Platelets: 219 10*3/uL (ref 150–400)
RBC: 3.45 MIL/uL — ABNORMAL LOW (ref 3.87–5.11)
RDW: 12.6 % (ref 11.5–15.5)
WBC: 7 10*3/uL (ref 4.0–10.5)
nRBC: 0 % (ref 0.0–0.2)

## 2021-11-01 LAB — COMPREHENSIVE METABOLIC PANEL
ALT: 29 U/L (ref 0–44)
AST: 26 U/L (ref 15–41)
Albumin: 3.5 g/dL (ref 3.5–5.0)
Alkaline Phosphatase: 67 U/L (ref 38–126)
Anion gap: 9 (ref 5–15)
BUN: 12 mg/dL (ref 8–23)
CO2: 29 mmol/L (ref 22–32)
Calcium: 9.3 mg/dL (ref 8.9–10.3)
Chloride: 105 mmol/L (ref 98–111)
Creatinine, Ser: 0.82 mg/dL (ref 0.44–1.00)
GFR, Estimated: >60 mL/min (ref >60)
Glucose, Bld: 107 mg/dL — ABNORMAL HIGH (ref 70–99)
Potassium: 3.6 mmol/L (ref 3.5–5.1)
Sodium: 143 mmol/L (ref 135–145)
Total Bilirubin: 1.4 mg/dL — ABNORMAL HIGH (ref 0.3–1.2)
Total Protein: 6.7 g/dL (ref 6.5–8.1)

## 2021-11-01 MED ORDER — BACLOFEN 10 MG PO TABS: 10.0000 mg | ORAL_TABLET | Freq: Three times a day (TID) | ORAL | 0 refills | Status: DC

## 2021-11-01 MED ORDER — ONDANSETRON HCL 4 MG PO TABS: 4.0000 mg | ORAL_TABLET | Freq: Three times a day (TID) | ORAL | 0 refills | Status: DC | PRN

## 2021-11-01 MED ORDER — SODIUM CHLORIDE 0.9 % IV BOLUS
1000.0000 mL | Freq: Once | INTRAVENOUS | Status: AC
  Administered 2021-11-01: 1000 mL via INTRAVENOUS

## 2021-11-01 MED ORDER — ONDANSETRON HCL 4 MG/2ML IJ SOLN
4.0000 mg | Freq: Once | INTRAMUSCULAR | Status: AC
  Administered 2021-11-01: 4 mg via INTRAVENOUS
  Filled 2021-11-01: qty 2

## 2021-11-01 MED ORDER — OXYCODONE HCL 5 MG PO TABS: 5.0000 mg | ORAL_TABLET | ORAL | 0 refills | Status: DC | PRN

## 2021-11-01 MED ORDER — HYDROMORPHONE HCL 1 MG/ML IJ SOLN
1.0000 mg | Freq: Once | INTRAMUSCULAR | Status: AC
  Administered 2021-11-01: 1 mg via INTRAVENOUS
  Filled 2021-11-01: qty 1

## 2021-11-01 NOTE — ED Provider Notes (Signed)
?[facility] DEPT ?Provider Note ? ? ?CSN: [id number] ?Arrival date & time: 11/01/21  1659 ? ?  ? ?History ? ?Chief Complaint  ?Patient presents with  ? Knee Pain  ? ? ?Jamie Villa is a 76 y.o. female here presenting with left knee pain.  Patient had a left knee replacement surgery done by Dr. French Ana 6 days ago.  She had some bleeding afterwards and went back to the office and saw the PA the following day.  Her dressing was changed.  She has been doing physical therapy.  She states that she has not been eating and drinking much and has been having worsening left knee pain.  She tried to call the office but did not get any response.  She was concerned so came here for further evaluation.  Patient is on aspirin and not on blood thinners. ? ?The history is provided by the patient.  ? ?  ? ?Home Medications ?Prior to Admission medications   ?Medication Sig Start Date End Date Taking? Authorizing Provider  ?baclofen (LIORESAL) 10 MG tablet Take 1 tablet (10 mg total) by mouth 3 (three) times daily. As needed for muscle spasm 11/01/21  Yes Merlene Pulling K, PA-C  ?ondansetron (ZOFRAN) 4 MG tablet Take 1 tablet (4 mg total) by mouth every 8 (eight) hours as needed for nausea or vomiting. 11/01/21  Yes Merlene Pulling K, PA-C  ?oxyCODONE (ROXICODONE) 5 MG immediate release tablet Take 1-2 tablets (5-10 mg total) by mouth every 4 (four) hours as needed for severe pain. 11/01/21  Yes Ventura Bruns, PA-C  ?Cholecalciferol 50 MCG (2000 UT) TABS Take 1 tablet (2,000 Units total) by mouth daily. 10/23/19   [name], MD  ?furosemide (LASIX) 20 MG tablet Take 1 tablet (20 mg total) by mouth daily. 09/21/21   [name], MD  ?meclizine (ANTIVERT) 25 MG tablet Take 25 mg by mouth 3 (three) times daily as needed for dizziness.    [provider]  ?olmesartan (BENICAR) 20 MG tablet TAKE 1 TABLET(20 MG) BY MOUTH DAILY 08/05/21   [name], MD  ?rosuvastatin (CRESTOR) 10 MG tablet  TAKE 1 TABLET(10 MG) BY MOUTH DAILY 05/11/21   [name], MD  ?UBRELVY 50 MG TABS TAKE 1 TABLET BY MOUTH DAILY AS NEEDED 03/04/21   [name], MD  ?   ? ?Allergies    ?Codeine and Penicillins   ? ?Review of Systems   ?Review of Systems  ?Musculoskeletal:   ?     Left knee pain  ?All other systems reviewed and are negative. ? ?Physical Exam ?Updated Vital Signs ?BP 135/79   Pulse 100   Temp 98 ?F (36.7 ?C) (Oral)   Resp 20   Ht '5\' 7"'$  (1.702 m)   Wt 72.1 kg   SpO2 98%   BMI 24.90 kg/m?  ?Physical Exam ?Vitals and nursing note reviewed.  ?Constitutional:   ?   Comments: Uncomfortable, dehydrated  ?HENT:  ?   Head: Normocephalic.  ?   Nose: Nose normal.  ?   Mouth/Throat:  ?   Mouth: Mucous membranes are dry.  ?Eyes:  ?   Extraocular Movements: Extraocular movements intact.  ?   Pupils: Pupils are equal, round, and reactive to light.  ?Cardiovascular:  ?   Rate and Rhythm: Normal rate and regular rhythm.  ?   Pulses: Normal pulses.  ?   Heart sounds: Normal heart sounds.  ?Pulmonary:  ?   Effort:  Pulmonary effort is normal.  ?   Breath sounds: Normal breath sounds.  ?Abdominal:  ?   General: Abdomen is flat.  ?   Palpations: Abdomen is soft.  ?Musculoskeletal:  ?   Cervical back: Normal range of motion and neck supple.  ?   Comments: Left knee with Steri-Strips in place.  There is no obvious purulent discharge.  Patient has some ecchymosis in the tib-fib area but no bony tenderness.  Patient has 2+ pulses  ?Neurological:  ?   General: No focal deficit present.  ?   Mental Status: She is alert and oriented to person, place, and time.  ?Psychiatric:     ?   Mood and Affect: Mood normal.     ?   Behavior: Behavior normal.  ? ? ? ?ED Results / Procedures / Treatments   ?Labs ?(all labs ordered are listed, but only abnormal results are displayed) ?Labs Reviewed  ?CBC WITH DIFFERENTIAL/PLATELET - Abnormal; Notable for the following components:  ?    Result Value  ? RBC 3.45 (*)   ? Hemoglobin 11.8 (*)   ?  HCT 34.3 (*)   ? MCH 34.2 (*)   ? All other components within normal limits  ?COMPREHENSIVE METABOLIC PANEL - Abnormal; Notable for the following components:  ? Glucose, Bld 107 (*)   ? Total Bilirubin 1.4 (*)   ? All other components within normal limits  ? ? ?EKG ?None ? ?Radiology ?DG Knee Complete 4 Views Left ? ?Result Date: 11/01/2021 ?CLINICAL DATA:  Left knee replacement.  Pain. EXAM: LEFT KNEE - COMPLETE 4+ VIEW COMPARISON:  None Available. FINDINGS: Changes of left knee replacement. Diffuse soft tissue swelling. No hardware complicating feature. No fracture, subluxation or dislocation. Suspect small to moderate left knee effusion. IMPRESSION: Left knee replacement.  No hardware complicating feature. Left knee effusion.  Diffuse soft tissue swelling. Electronically Signed   By: Rolm Baptise M.D.   On: 11/01/2021 18:07   ? ?Procedures ?Procedures  ? ? ?Medications Ordered in ED ?Medications  ?HYDROmorphone (DILAUDID) injection 1 mg (1 mg Intravenous Given 11/01/21 1749)  ?ondansetron (ZOFRAN) injection 4 mg (4 mg Intravenous Given 11/01/21 1749)  ?sodium chloride 0.9 % bolus 1,000 mL (0 mLs Intravenous Stopped 11/01/21 2042)  ? ? ?ED Course/ Medical Decision Making/ A&P ?  ?                        ?Medical Decision Making ?Jamie Villa is a 76 y.o. female here presenting with left knee pain after left knee replacement.  She is postop day #6 after knee replacement.  There is no signs of septic joint.  I consulted Dr. Mardelle Matte upon arrival.  He recommend pain control and basic labs ? ?9:11 PM ?Labs show hemoglobin 11.8 and hemoglobin was 13 previous to the surgery.  Chemistry was unremarkable.  X-ray showed hardware in place.  Dr. Luanna Cole PA came to see the patient.  She was able to order outpatient ultrasound and also get her follow-up in the office.  She also prescribe extra oxycodone and also baclofen.  At this point she is stable for discharge ? ? ?Problems Addressed: ?Postoperative pain: acute illness  or injury ? ?Amount and/or Complexity of Data Reviewed ?Labs: ordered. Decision-making details documented in ED Course. ?Radiology: ordered and independent interpretation performed. Decision-making details documented in ED Course. ? ?Risk ?Prescription drug management. ? ? ? ?Final Clinical Impression(s) / ED Diagnoses ?Final diagnoses:  ?None  ? ? ?  Rx / DC Orders ?ED Discharge Orders   ? ?      Ordered  ?  ondansetron (ZOFRAN) 4 MG tablet  Every 8 hours PRN       ? 11/01/21 2050  ?  oxyCODONE (ROXICODONE) 5 MG immediate release tablet  Every 4 hours PRN       ? 11/01/21 2050  ?  baclofen (LIORESAL) 10 MG tablet  3 times daily       ? 11/01/21 2050  ? ?  ?  ? ?  ? ? ?  ?Drenda Freeze, MD ?11/01/21 2112 ? ?

## 2021-11-01 NOTE — ED Triage Notes (Signed)
Pt had a total knee replacement done at [facility] last Tuesday. Pt has had poor pain management since being being sent home. Unsure if patient has been receiving her pain medication as prescribed. Pt husband has been giving her meds to her. Knee pain became unbearable today. Pt wants rehab placement ?

## 2021-11-01 NOTE — Consult Note (Signed)
? ?ORTHOPAEDIC CONSULTATION ? ?REQUESTING PHYSICIAN: Drenda Freeze, MD ? ?Chief Complaint: left knee pain ? ?HPI: ?Jamie Villa is a 76 y.o. female with history of lymphoma, hiatal hernia, hypertension, hypothyroid who complains of severe left knee pain.  She recently underwent a left total knee replacement with Dr. French Ana at the outpatient surgery center last Tuesday.  She had presented to our office the second day with severe pain and moderate drainage from her wound.  Continue with home health physical therapy and using her CPM but Started to have severe pain today.  She and her husband state that she has been using oxycodone every 4 hours as well as Tylenol.  She states she had not eaten anything today due to nausea.  Pain is located diffusely at the left knee.  She denies calf pain or pain at other joints.  She denies any new injury.  Pain is worse with motion and better with rest and IV pain medication.  She feels significantly better since receiving IV Dilaudid as well as IV Zofran in the emergency department.  She denies chest pain, shortness of breath, abdominal pain.  She has had some nausea but no vomiting recently. ? ?Past Medical History:  ?Diagnosis Date  ? Cancer [facility])   ? Large B cell Lymphoma  ? Complication of anesthesia   ? Difficult to arouse  ? Hiatal hernia   ? HTN (hypertension)   ? Hx of colonic polyps   ? Hyperlipidemia   ? Hypothyroid   ? Pneumonia   ? Healthcare-associated versus community-acquired pneumonia  ? PONV (postoperative nausea and vomiting)   ? ?Past Surgical History:  ?Procedure Laterality Date  ? ABDOMINAL HYSTERECTOMY    ? CHOLECYSTECTOMY N/A 07/12/2018  ? Procedure: LAPAROSCOPIC CHOLECYSTECTOMY WITH INTRAOPERATIVE CHOLANGIOGRAM ERAS PATHWAY;  Surgeon: Jovita Kussmaul, MD;  Location: [facility] ORS;  Service: General;  Laterality: N/A;  ? COLONOSCOPY  11/2017  ? S/P STEM CELL TRANSPLANT    ? Large B-Cell lymphoma  ? ?Social History  ? ?Socioeconomic History  ? Marital  status: Married  ?  Spouse name: Not on file  ? Number of children: 2  ? Years of education: Not on file  ? Highest education level: Not on file  ?Occupational History  ? 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  Comment: Retired  ?Tobacco Use  ? Smoking status: Never  ? Smokeless tobacco: Never  ?Vaping Use  ? Vaping Use: Never used  ?Substance and Sexual Activity  ? Alcohol use: Not Currently  ?  Alcohol/week: 1.0 standard drink  ?  Types: 1 Glasses of wine per week  ?  Comment: none since november  ? Drug use: No  ? Sexual activity: Yes  ?Other Topics Concern  ? Not on file  ?Social History Narrative  ? Married and currently lives with husband.  ? ?Social Determinants of Health  ? ?Financial Resource Strain: Not on file  ?Food Insecurity: Not on file  ?Transportation Needs: Not on file  ?Physical Activity: Not on file  ?Stress: Not on file  ?Social Connections: Not on file  ? ?Family History  ?Problem Relation Age of Onset  ? Hypertension Other   ? Coronary artery disease Neg Hx   ? ?Allergies  ?Allergen Reactions  ? Codeine Other (See Comments)  ?  Unknown  ? Penicillins Hives and Other (See Comments)  ?  DID THE REACTION INVOLVE: Swelling of the face/tongue/throat, SOB, or low BP? Unknown ?Sudden or severe rash/hives, skin  peeling, or the inside of the mouth or nose? Unknown ?Did it require medical treatment? Unknown ?When did it last happen? Told all her life not to take ?If all above answers are "NO", may proceed with cephalosporin use. ?  ? ? ? ?Positive ROS: All other systems have been reviewed and were otherwise negative with the exception of those mentioned in the HPI and as above. ? ?Physical Exam: ?General: Alert, no acute distress ?Cardiovascular: No pedal edema ?Respiratory: No cyanosis, no use of accessory musculature ?GI: No organomegaly, abdomen is soft and non-tender ?Skin: No lesions in the area of chief complaint ?Neurologic: Sensation intact distally ?Psychiatric: Patient is competent  for consent with normal mood and affect ?Lymphatic: No axillary or cervical lymphadenopathy ? ?MUSCULOSKELETAL: Normal postoperative left knee swelling.  Diffuse ecchymosis at the left thigh and lower leg.  Able to dorsiflex and plantarflex at the ankle.  2+ DP pulse.  Sensation intact to all aspects of the left foot.  Able to flex and extend all toes.  Calf nontender and and compressible.  Dressing without significant drainage. ? ?Assessment: ?Pain after left total knee replacement ? ? ?Plan: ?I will plan to give a repeat prescription of oxycodone which she can use 1-2 5 mg pills every 4 hours as needed. I will also add on a muscle relaxer and Zofran for nausea.  We discussed possibly performing a Doppler ultrasound however there is not staff within the [facility] long emergency department to perform this this evening.  My suspicion is low for DVT but I like to rule this out in the setting of severe left knee pain.  She will plan to discharge home this evening we will set up doppler ultrasound as an outpatient tomorrow.  Patient and husband amenable to plan and would like to discharge home this evening.  Our office will call her tomorrow to set up close follow-up with Dr. Mardelle Matte on Wednesday. ? ? ? ?Ventura Bruns, PA-C ? ? ?11/01/2021 ?8:51 PM ?  ?

## 2021-11-01 NOTE — ED Notes (Signed)
Pt provided with sandwich as well as drink.

## 2021-11-01 NOTE — Discharge Instructions (Addendum)
You have postoperative pain  ? ?Please continue taking oxycodone every 4 hours as needed for pain.  Take Zofran for nausea. ? ?Take baclofen for muscle spasms. ? ?The PA has ordered a outpatient ultrasound to be done tomorrow. ? ?Please use compression stockings ? ?Your prescriptions were sent to the [place] [facility] ? ?Please call Dr. Alvester Morin office this week for follow-up appointment ? ?Return to ER if she has worsening pain, fever, worse knee swelling ?

## 2021-11-02 ENCOUNTER — Ambulatory Visit (HOSPITAL_COMMUNITY)
Admission: RE | Admit: 2021-11-02 | Discharge: 2021-11-02 | Disposition: A | Discharge status: Home / Self Care | Payer: Medicare Other | Source: Ambulatory Visit | Attending: Orthopedic Surgery | Admitting: Orthopedic Surgery

## 2021-11-02 ENCOUNTER — Other Ambulatory Visit (HOSPITAL_COMMUNITY): Payer: Self-pay | Admitting: Orthopedic Surgery

## 2021-11-02 ENCOUNTER — Telehealth: Payer: Self-pay | Admitting: Internal Medicine

## 2021-11-02 DIAGNOSIS — M79662 Pain in left lower leg: Secondary | ICD-10-CM

## 2021-11-02 DIAGNOSIS — M7989 Other specified soft tissue disorders: Secondary | ICD-10-CM | POA: Diagnosis not present

## 2021-11-02 NOTE — Telephone Encounter (Signed)
Pt daughter called in and wants a callback regarding pt's current health. States it is declining and memory is getting worse.  ? ?States that she stays out of town, and just needs to talk about some things for pt. She is Medical POA.  ?

## 2021-11-02 NOTE — Telephone Encounter (Signed)
Pt's daughter,Julie, has notice changes in pt's memory over the last 6-14moand she is concerned about dementia. JAlmyra Freealso mentioned that the pt has moments described as a "nasty demeanor and temperament" towards family and friends that has taken place along side of the memory issues. JAlmyra Freealso mentioned that a family friend has been helping the pt with her finances/bill pay to ensure things have been taken care of.  ? ?I suggested that an OV be made to discuss the concerns listed above. However, JAlmyra Freestated the pt has had a recent knee replacement a week ago and is currently in recovery. She stated that once she is back moving and capable of getting out they will call the office to schedule an OV with PCP.  ? ?FYI ? ?

## 2021-11-03 DIAGNOSIS — M1712 Unilateral primary osteoarthritis, left knee: Secondary | ICD-10-CM | POA: Diagnosis not present

## 2021-11-04 ENCOUNTER — Other Ambulatory Visit: Payer: Self-pay | Admitting: Internal Medicine

## 2021-11-04 DIAGNOSIS — E785 Hyperlipidemia, unspecified: Secondary | ICD-10-CM

## 2021-11-16 DIAGNOSIS — R262 Difficulty in walking, not elsewhere classified: Secondary | ICD-10-CM | POA: Diagnosis not present

## 2021-11-16 DIAGNOSIS — M1712 Unilateral primary osteoarthritis, left knee: Secondary | ICD-10-CM | POA: Diagnosis not present

## 2021-11-16 DIAGNOSIS — M25662 Stiffness of left knee, not elsewhere classified: Secondary | ICD-10-CM | POA: Diagnosis not present

## 2021-11-16 DIAGNOSIS — M6281 Muscle weakness (generalized): Secondary | ICD-10-CM | POA: Diagnosis not present

## 2021-11-18 DIAGNOSIS — R262 Difficulty in walking, not elsewhere classified: Secondary | ICD-10-CM | POA: Diagnosis not present

## 2021-11-18 DIAGNOSIS — M6281 Muscle weakness (generalized): Secondary | ICD-10-CM | POA: Diagnosis not present

## 2021-11-18 DIAGNOSIS — M25662 Stiffness of left knee, not elsewhere classified: Secondary | ICD-10-CM | POA: Diagnosis not present

## 2021-11-18 DIAGNOSIS — M1712 Unilateral primary osteoarthritis, left knee: Secondary | ICD-10-CM | POA: Diagnosis not present

## 2021-11-24 DIAGNOSIS — M6281 Muscle weakness (generalized): Secondary | ICD-10-CM | POA: Diagnosis not present

## 2021-11-24 DIAGNOSIS — M25662 Stiffness of left knee, not elsewhere classified: Secondary | ICD-10-CM | POA: Diagnosis not present

## 2021-11-24 DIAGNOSIS — R262 Difficulty in walking, not elsewhere classified: Secondary | ICD-10-CM | POA: Diagnosis not present

## 2021-11-24 DIAGNOSIS — M1712 Unilateral primary osteoarthritis, left knee: Secondary | ICD-10-CM | POA: Diagnosis not present

## 2021-11-26 DIAGNOSIS — R262 Difficulty in walking, not elsewhere classified: Secondary | ICD-10-CM | POA: Diagnosis not present

## 2021-11-26 DIAGNOSIS — M1712 Unilateral primary osteoarthritis, left knee: Secondary | ICD-10-CM | POA: Diagnosis not present

## 2021-11-26 DIAGNOSIS — M25662 Stiffness of left knee, not elsewhere classified: Secondary | ICD-10-CM | POA: Diagnosis not present

## 2021-11-26 DIAGNOSIS — M6281 Muscle weakness (generalized): Secondary | ICD-10-CM | POA: Diagnosis not present

## 2021-11-29 DIAGNOSIS — M25662 Stiffness of left knee, not elsewhere classified: Secondary | ICD-10-CM | POA: Diagnosis not present

## 2021-11-29 DIAGNOSIS — R262 Difficulty in walking, not elsewhere classified: Secondary | ICD-10-CM | POA: Diagnosis not present

## 2021-11-29 DIAGNOSIS — M6281 Muscle weakness (generalized): Secondary | ICD-10-CM | POA: Diagnosis not present

## 2021-11-29 DIAGNOSIS — M1712 Unilateral primary osteoarthritis, left knee: Secondary | ICD-10-CM | POA: Diagnosis not present

## 2021-12-02 DIAGNOSIS — R262 Difficulty in walking, not elsewhere classified: Secondary | ICD-10-CM | POA: Diagnosis not present

## 2021-12-02 DIAGNOSIS — M25662 Stiffness of left knee, not elsewhere classified: Secondary | ICD-10-CM | POA: Diagnosis not present

## 2021-12-02 DIAGNOSIS — M1712 Unilateral primary osteoarthritis, left knee: Secondary | ICD-10-CM | POA: Diagnosis not present

## 2021-12-02 DIAGNOSIS — M6281 Muscle weakness (generalized): Secondary | ICD-10-CM | POA: Diagnosis not present

## 2021-12-07 DIAGNOSIS — M25662 Stiffness of left knee, not elsewhere classified: Secondary | ICD-10-CM | POA: Diagnosis not present

## 2021-12-07 DIAGNOSIS — M6281 Muscle weakness (generalized): Secondary | ICD-10-CM | POA: Diagnosis not present

## 2021-12-07 DIAGNOSIS — M1712 Unilateral primary osteoarthritis, left knee: Secondary | ICD-10-CM | POA: Diagnosis not present

## 2021-12-07 DIAGNOSIS — R262 Difficulty in walking, not elsewhere classified: Secondary | ICD-10-CM | POA: Diagnosis not present

## 2021-12-09 DIAGNOSIS — R262 Difficulty in walking, not elsewhere classified: Secondary | ICD-10-CM | POA: Diagnosis not present

## 2021-12-09 DIAGNOSIS — M6281 Muscle weakness (generalized): Secondary | ICD-10-CM | POA: Diagnosis not present

## 2021-12-09 DIAGNOSIS — M1712 Unilateral primary osteoarthritis, left knee: Secondary | ICD-10-CM | POA: Diagnosis not present

## 2021-12-09 DIAGNOSIS — M25662 Stiffness of left knee, not elsewhere classified: Secondary | ICD-10-CM | POA: Diagnosis not present

## 2021-12-13 DIAGNOSIS — M25662 Stiffness of left knee, not elsewhere classified: Secondary | ICD-10-CM | POA: Diagnosis not present

## 2021-12-13 DIAGNOSIS — R262 Difficulty in walking, not elsewhere classified: Secondary | ICD-10-CM | POA: Diagnosis not present

## 2021-12-13 DIAGNOSIS — M1712 Unilateral primary osteoarthritis, left knee: Secondary | ICD-10-CM | POA: Diagnosis not present

## 2021-12-13 DIAGNOSIS — M6281 Muscle weakness (generalized): Secondary | ICD-10-CM | POA: Diagnosis not present

## 2021-12-16 DIAGNOSIS — R262 Difficulty in walking, not elsewhere classified: Secondary | ICD-10-CM | POA: Diagnosis not present

## 2021-12-16 DIAGNOSIS — M1712 Unilateral primary osteoarthritis, left knee: Secondary | ICD-10-CM | POA: Diagnosis not present

## 2021-12-16 DIAGNOSIS — M6281 Muscle weakness (generalized): Secondary | ICD-10-CM | POA: Diagnosis not present

## 2021-12-16 DIAGNOSIS — M25662 Stiffness of left knee, not elsewhere classified: Secondary | ICD-10-CM | POA: Diagnosis not present

## 2021-12-25 ENCOUNTER — Other Ambulatory Visit: Payer: Self-pay | Admitting: Internal Medicine

## 2021-12-25 DIAGNOSIS — I10 Essential (primary) hypertension: Secondary | ICD-10-CM

## 2022-02-05 ENCOUNTER — Other Ambulatory Visit: Payer: Self-pay | Admitting: Internal Medicine

## 2022-02-05 DIAGNOSIS — I10 Essential (primary) hypertension: Secondary | ICD-10-CM

## 2022-02-28 ENCOUNTER — Telehealth: Payer: BLUE CROSS/BLUE SHIELD

## 2022-03-30 DIAGNOSIS — M1712 Unilateral primary osteoarthritis, left knee: Secondary | ICD-10-CM | POA: Diagnosis not present

## 2022-04-09 ENCOUNTER — Other Ambulatory Visit: Payer: Self-pay | Admitting: Internal Medicine

## 2022-04-09 DIAGNOSIS — I10 Essential (primary) hypertension: Secondary | ICD-10-CM

## 2022-04-12 ENCOUNTER — Ambulatory Visit (INDEPENDENT_AMBULATORY_CARE_PROVIDER_SITE_OTHER): Payer: Medicare Other

## 2022-04-12 VITALS — Ht 66.0 in | Wt 150.0 lb

## 2022-04-12 DIAGNOSIS — Z0000 Encounter for general adult medical examination without abnormal findings: Secondary | ICD-10-CM

## 2022-04-12 NOTE — Progress Notes (Signed)
Virtual Visit via Telephone Note  I connected with  Jamie Villa on 04/12/22 at  1:30 PM EDT by telephone and verified that I am speaking with the correct person using two identifiers.  Location: Patient: Home Provider: Pietro Cassis Persons participating in the virtual visit: patient/Nurse Health Advisor   I discussed the limitations, risks, security and privacy concerns of performing an evaluation and management service by telephone and the availability of in person appointments. The patient expressed understanding and agreed to proceed.  Interactive audio and video telecommunications were attempted between this nurse and patient, however failed, due to patient having technical difficulties OR patient did not have access to video capability.  We continued and completed visit with audio only.  Some vital signs may be absent or patient reported.   Sheral Flow, LPN  Subjective:   Jamie Villa is a 76 y.o. female who presents for Medicare Annual (Subsequent) preventive examination.  Review of Systems     Cardiac Risk Factors include: advanced age (>20mn, >>25women);dyslipidemia;hypertension     Objective:    Today's Vitals   04/12/22 1344  Weight: 150 lb (68 kg)  Height: '5\' 6"'$  (1.676 m)  PainSc: 0-No pain   Body mass index is 24.21 kg/m.     04/12/2022    1:36 PM 11/01/2021    6:10 PM 10/06/2020   12:39 PM 06/30/2020    2:35 PM 12/27/2019    2:47 PM 05/03/2019    2:52 PM 07/12/2018   10:00 AM  Advanced Directives  Does Patient Have a Medical Advance Directive? Yes Yes Yes Yes Yes Yes Yes  Type of AParamedicof [place]Living will [place]Living will  Healthcare Power of [place]Living will H[place]Living will Living will  Does patient want to make changes to medical advance directive?  No - Patient declined No - Patient declined    No - Patient  declined  Copy of [place]in Chart? No - copy requested No - copy requested   No - copy requested No - copy requested     Current Medications (verified) Outpatient Encounter Medications as of 04/12/2022  Medication Sig   Cholecalciferol 50 MCG (2000 UT) TABS Take 1 tablet (2,000 Units total) by mouth daily.   olmesartan (BENICAR) 20 MG tablet TAKE 1 TABLET(20 MG) BY MOUTH DAILY   rosuvastatin (CRESTOR) 10 MG tablet TAKE 1 TABLET(10 MG) BY MOUTH DAILY   baclofen (LIORESAL) 10 MG tablet Take 1 tablet (10 mg total) by mouth 3 (three) times daily. As needed for muscle spasm (Patient not taking: Reported on 04/12/2022)   furosemide (LASIX) 20 MG tablet TAKE 1 TABLET(20 MG) BY MOUTH DAILY (Patient not taking: Reported on 04/12/2022)   meclizine (ANTIVERT) 25 MG tablet Take 25 mg by mouth 3 (three) times daily as needed for dizziness. (Patient not taking: Reported on 04/12/2022)   ondansetron (ZOFRAN) 4 MG tablet Take 1 tablet (4 mg total) by mouth every 8 (eight) hours as needed for nausea or vomiting. (Patient not taking: Reported on 04/12/2022)   oxyCODONE (ROXICODONE) 5 MG immediate release tablet Take 1-2 tablets (5-10 mg total) by mouth every 4 (four) hours as needed for severe pain. (Patient not taking: Reported on 04/12/2022)   UBRELVY 50 MG TABS TAKE 1 TABLET BY MOUTH DAILY AS NEEDED (Patient not taking: Reported on 04/12/2022)   No facility-administered encounter medications on file as of 04/12/2022.  Allergies (verified) Codeine and Penicillins   History: Past Medical History:  Diagnosis Date   Cancer ([place])    Large B cell Lymphoma   Complication of anesthesia    Difficult to arouse   Hiatal hernia    HTN (hypertension)    Hx of colonic polyps    Hyperlipidemia    Hypothyroid    Pneumonia    Healthcare-associated versus community-acquired pneumonia   PONV (postoperative nausea and vomiting)    Past Surgical History:  Procedure Laterality Date    ABDOMINAL HYSTERECTOMY     CHOLECYSTECTOMY N/A 07/12/2018   Procedure: LAPAROSCOPIC CHOLECYSTECTOMY WITH INTRAOPERATIVE CHOLANGIOGRAM ERAS PATHWAY;  Surgeon: Jovita Kussmaul, MD;  Location: [facility];  Service: General;  Laterality: N/A;   COLONOSCOPY  11/2017   S/P STEM CELL TRANSPLANT     Large B-Cell lymphoma   Family History  Problem Relation Age of Onset   Hypertension Other    Coronary artery disease Neg Hx    Social History   Socioeconomic History   Marital status: Married    Spouse name: Not on file   Number of children: 2   Years of education: Not on file   Highest education level: Not on file  Occupational History   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   Social Determinants of Health   Financial Resource Strain: Low Risk  (04/12/2022)   Overall Financial Resource Strain (CARDIA)    Difficulty of Paying Living Expenses: Not hard at all  Food Insecurity: No Food Insecurity (10/06/2020)   Hunger Vital Sign    Worried About Running Out of Food in the Last Year: Never true    Ran Out of Food in the Last Year: Never true  Transportation Needs: No Transportation Needs (04/12/2022)   PRAPARE - Hydrologist (Medical): No    Lack of Transportation (Non-Medical): No  Physical Activity: Sufficiently Active (04/12/2022)   Exercise Vital Sign    Days of Exercise per Week: 7 days    Minutes of Exercise per Session: 30 min  Stress: No Stress Concern Present (04/12/2022)   [place]    Feeling of Stress : Not at all  Social Connections: [place] (04/12/2022)   Social Connection and Isolation Panel [NHANES]    Frequency of Communication with Friends and Family: More than three times a week    Frequency of Social Gatherings with Friends and Family: More than three times a week    Attends Religious Services: More than 4 times per year    Active Member of [employer] or Organizations: Yes    Attends Music therapist: More than 4 times per year    Marital Status: Married    Tobacco Counseling Counseling given: Not Answered   Clinical Intake:  Pre-visit preparation completed: Yes  Pain : No/denies pain Pain Score: 0-No pain     BMI -  recorded: 24.21 (patient stated weight) Nutritional Status: BMI of 19-24  Normal Nutritional Risks: None Diabetes: No  How often do you need to have someone help you when you read instructions, pamphlets, or other written materials from your doctor or pharmacy?: 1 - Never What is the last grade level you completed in school?: HSG  Diabetic? no  Interpreter Needed?: No  Information entered by :: Lisette Abu, LPN.   Activities of Daily Living    04/12/2022    1:47 PM 11/01/2021    6:15 PM  In your present state of health, do you have any difficulty performing the following activities:  Hearing? 0   Vision? 0   Difficulty concentrating or making decisions? 0   Walking or climbing stairs? 0   Dressing or bathing? 0   Doing errands, shopping? 0 1  Preparing Food and eating ? N   Using the Toilet? N   In the past six months, have you accidently leaked urine? N   Do you have problems with loss of bowel control? N   Managing your Medications? N   Managing your Finances? N   Housekeeping or managing your Housekeeping? N     Patient Care Team: [name], MD as PCP - Elwin Sleight, MD as Referring Physician (Internal  Medicine) Alanda Slim Neena Rhymes, MD as Consulting Physician (Ophthalmology)  Indicate any recent Medical Services you may have received from other than [facility] providers in the past year (date may be approximate).     Assessment:   This is a routine wellness examination for [place].  Hearing/Vision screen Hearing Screening - Comments:: Patient has hearing aids. Vision Screening - Comments:: Wears rx glasses - up to date with routine eye exams with Julian Reil, MD.   Dietary issues and exercise activities discussed: Current Exercise Habits: Home exercise routine, Type of exercise: walking, Time (Minutes): 30, Frequency (Times/Week): 7, Weekly Exercise (Minutes/Week): 210, Intensity: Moderate, Exercise limited by: None identified   Goals Addressed             This Visit's Progress    Client understands the importance of follow-up with providers by attending scheduled visits        Depression Screen    04/12/2022    1:40 PM 10/06/2020   12:39 PM 05/03/2019    2:51 PM 03/18/2019    4:09 PM 03/02/2018    3:40 PM 02/22/2017    2:08 PM 09/17/2015    2:12 PM  PHQ 2/9 Scores  PHQ - 2 Score 0 0 0 '1 1 1 '$ 0  PHQ- 9 Score    '3 2 1     '$ Fall Risk    04/12/2022    1:37 PM 10/06/2020   12:39 PM 12/27/2019    2:49 PM 05/03/2019    2:51 PM 03/18/2019    4:09 PM  [place] in the past year? 0 0 1 0 0  Number falls in past yr: 0 0 0 0 0  Injury with Fall? 0 0 0 0 0  Risk for fall due to : No Fall Risks No Fall Risks     Follow up Falls prevention discussed Falls evaluation completed   Falls evaluation completed    [place]:  Any stairs in or around the home? Yes  If so, are there any without handrails? No  Home free of loose throw rugs in walkways, pet beds, electrical cords, etc? Yes  Adequate lighting in your  home to reduce risk of falls? Yes   ASSISTIVE DEVICES UTILIZED TO PREVENT FALLS:  Life alert? No  Use of a cane, walker or w/c? No   Grab bars in the bathroom? Yes  Shower chair or bench in shower? Yes  Elevated toilet seat or a handicapped toilet? Yes   TIMED UP AND GO:  Was the test performed? No . Phone Visit  Cognitive Function:    02/22/2017    2:21 PM  MMSE - Mini Mental State Exam  Orientation to time 5  Orientation to Place 5  Registration 3  Attention/ Calculation 5  Recall 3  Language- name 2 objects 2  Language- repeat 1  Language- follow 3 step command 3  Language- read & follow direction 1  Write a sentence 1  Copy design 1  Total score 30        04/12/2022    1:47 PM  6CIT Screen  What Year? 0 points  What month? 0 points  What time? 0 points  Count back from 20 0 points  Months in reverse 0 points  Repeat phrase 0 points  Total Score 0 points    Immunizations Immunization History  Administered Date(s) Administered   Influenza Split 05/16/2011, 04/03/2012   PFIZER(Purple Top)SARS-COV-2 Vaccination 11/07/2019, 12/02/2019   Pneumococcal Conjugate-13 04/29/2010   Pneumococcal Polysaccharide-23 05/08/2014   Td 06/20/2004, 12/05/2013   Zoster, Live 06/04/2013    TDAP status: Up to date  Flu Vaccine status: Declined, Education has been provided regarding the importance of this vaccine but patient still declined. Advised may receive this vaccine at local pharmacy or Health Dept. Aware to provide a copy of the vaccination record if obtained from local pharmacy or Health Dept. Verbalized acceptance and understanding.  Pneumococcal vaccine status: Up to date  Covid-19 vaccine status: Completed vaccines  Qualifies for Shingles Vaccine? Yes   Zostavax completed Yes   Shingrix Completed?: No.    Education has been provided regarding the importance of this vaccine. Patient has been advised to call insurance company to determine out of pocket expense if they have not yet received this vaccine. Advised may also receive vaccine at local pharmacy or Health Dept. Verbalized acceptance and  understanding.  Screening Tests Health Maintenance  Topic Date Due   Zoster Vaccines- Shingrix (1 of 2) Never done   Medicare Annual Wellness (AWV)  05/13/2023   TETANUS/TDAP  12/06/2023   Pneumonia Vaccine 69+ Years old  Completed   DEXA SCAN  Completed   Hepatitis C Screening  Completed   HPV VACCINES  Aged Out   INFLUENZA VACCINE  Discontinued   COLONOSCOPY (Pts 45-83yr Insurance coverage will need to be confirmed)  Discontinued   COVID-19 Vaccine  Discontinued    Health Maintenance  Health Maintenance Due  Topic Date Due   Zoster Vaccines- Shingrix (1 of 2) Never done    Colorectal cancer screening: No longer required.   Mammogram status: Completed 11/10/2020. Repeat every year (every 2 years)  Bone Density status: never done  Lung Cancer Screening: (Low Dose CT Chest recommended if Age 78-80years, 30 pack-year currently smoking OR have quit w/in 15years.) does not qualify.   Lung Cancer Screening Referral: no  Additional Screening:  Hepatitis C Screening: does qualify; Completed 05/20/2015  Vision Screening: Recommended annual ophthalmology exams for early detection of glaucoma and other disorders of the eye. Is the patient up to date with their annual eye exam?  Yes  Who is the provider or what is the  name of the office in which the patient attends annual eye exams? Julian Reil, MD. If pt is not established with a provider, would they like to be referred to a provider to establish care? No .   Dental Screening: Recommended annual dental exams for proper oral hygiene  Community Resource Referral / Chronic Care Management: CRR required this visit?  No   CCM required this visit?  No      Plan:     I have personally reviewed and noted the following in the patient's chart:   Medical and social history Use of alcohol, tobacco or illicit drugs  Current medications and supplements including opioid prescriptions. Patient is not currently taking opioid  prescriptions. Functional ability and status Nutritional status Physical activity Advanced directives List of other physicians Hospitalizations, surgeries, and ER visits in previous 12 months Vitals Screenings to include cognitive, depression, and falls Referrals and appointments  In addition, I have reviewed and discussed with patient certain preventive protocols, quality metrics, and best practice recommendations. A written personalized care plan for preventive services as well as general preventive health recommendations were provided to patient.     Sheral Flow, LPN   [id number]   Nurse Notes: N/A

## 2022-04-12 NOTE — Patient Instructions (Signed)
Ms. Soutar , Thank you for taking time to come for your Medicare Wellness Visit. I appreciate your ongoing commitment to your health goals. Please review the following plan we discussed and let me know if I can assist you in the future.   These are the goals we discussed:  Goals      Client understands the importance of follow-up with providers by attending scheduled visits        This is a list of the screening recommended for you and due dates:  Health Maintenance  Topic Date Due   Zoster (Shingles) Vaccine (1 of 2) Never done   Medicare Annual Wellness Visit  05/13/2023   Tetanus Vaccine  12/06/2023   Pneumonia Vaccine  Completed   DEXA scan (bone density measurement)  Completed   Hepatitis C Screening: USPSTF Recommendation to screen - Ages 15-79 yo.  Completed   HPV Vaccine  Aged Out   Flu Shot  Discontinued   Colon Cancer Screening  Discontinued   COVID-19 Vaccine  Discontinued    Advanced directives: Yes; Please bring a copy of your health care power of attorney and living will to the office at your convenience.  Conditions/risks identified: Yes  Next appointment: Follow up in one year for your annual wellness visit.   Preventive Care 18 Years and Older, Female Preventive care refers to lifestyle choices and visits with your health care provider that can promote health and wellness. What does preventive care include? A yearly physical exam. This is also called an annual well check. Dental exams once or twice a year. Routine eye exams. Ask your health care provider how often you should have your eyes checked. Personal lifestyle choices, including: Daily care of your teeth and gums. Regular physical activity. Eating a healthy diet. Avoiding tobacco and drug use. Limiting alcohol use. Practicing safe sex. Taking low-dose aspirin every day. Taking vitamin and mineral supplements as recommended by your health care provider. What happens during an annual well  check? The services and screenings done by your health care provider during your annual well check will depend on your age, overall health, lifestyle risk factors, and family history of disease. Counseling  Your health care provider may ask you questions about your: Alcohol use. Tobacco use. Drug use. Emotional well-being. Home and relationship well-being. Sexual activity. Eating habits. History of falls. Memory and ability to understand (cognition). Work and work Statistician. Reproductive health. Screening  You may have the following tests or measurements: Height, weight, and BMI. Blood pressure. Lipid and cholesterol levels. These may be checked every 5 years, or more frequently if you are over 63 years old. Skin check. Lung cancer screening. You may have this screening every year starting at age 54 if you have a 30-pack-year history of smoking and currently smoke or have quit within the past 15 years. Fecal occult blood test (FOBT) of the stool. You may have this test every year starting at age 11. Flexible sigmoidoscopy or colonoscopy. You may have a sigmoidoscopy every 5 years or a colonoscopy every 10 years starting at age 3. Hepatitis C blood test. Hepatitis B blood test. Sexually transmitted disease (STD) testing. Diabetes screening. This is done by checking your blood sugar (glucose) after you have not eaten for a while (fasting). You may have this done every 1-3 years. Bone density scan. This is done to screen for osteoporosis. You may have this done starting at age 19. Mammogram. This may be done every 1-2 years. Talk to your health  care provider about how often you should have regular mammograms. Talk with your health care provider about your test results, treatment options, and if necessary, the need for more tests. Vaccines  Your health care provider may recommend certain vaccines, such as: Influenza vaccine. This is recommended every year. Tetanus, diphtheria, and  acellular pertussis (Tdap, Td) vaccine. You may need a Td booster every 10 years. Zoster vaccine. You may need this after age 22. Pneumococcal 13-valent conjugate (PCV13) vaccine. One dose is recommended after age 42. Pneumococcal polysaccharide (PPSV23) vaccine. One dose is recommended after age 52. Talk to your health care provider about which screenings and vaccines you need and how often you need them. This information is not intended to replace advice given to you by your health care provider. Make sure you discuss any questions you have with your health care provider. Document Released: 07/03/2015 Document Revised: 02/24/2016 Document Reviewed: 04/07/2015 Elsevier Interactive Patient Education  2017 [place] Prevention in the Home Falls can cause injuries. They can happen to people of all ages. There are many things you can do to make your home safe and to help prevent falls. What can I do on the outside of my home? Regularly fix the edges of walkways and driveways and fix any cracks. Remove anything that might make you trip as you walk through a door, such as a raised step or threshold. Trim any bushes or trees on the path to your home. Use bright outdoor lighting. Clear any walking paths of anything that might make someone trip, such as rocks or tools. Regularly check to see if handrails are loose or broken. Make sure that both sides of any steps have handrails. Any raised decks and porches should have guardrails on the edges. Have any leaves, snow, or ice cleared regularly. Use sand or salt on walking paths during winter. Clean up any spills in your garage right away. This includes oil or grease spills. What can I do in the bathroom? Use night lights. Install grab bars by the toilet and in the tub and shower. Do not use towel bars as grab bars. Use non-skid mats or decals in the tub or shower. If you need to sit down in the shower, use a plastic, non-slip stool. Keep  the floor dry. Clean up any water that spills on the floor as soon as it happens. Remove soap buildup in the tub or shower regularly. Attach bath mats securely with double-sided non-slip rug tape. Do not have throw rugs and other things on the floor that can make you trip. What can I do in the bedroom? Use night lights. Make sure that you have a light by your bed that is easy to reach. Do not use any sheets or blankets that are too big for your bed. They should not hang down onto the floor. Have a firm chair that has side arms. You can use this for support while you get dressed. Do not have throw rugs and other things on the floor that can make you trip. What can I do in the kitchen? Clean up any spills right away. Avoid walking on wet floors. Keep items that you use a lot in easy-to-reach places. If you need to reach something above you, use a strong step stool that has a grab bar. Keep electrical cords out of the way. Do not use floor polish or wax that makes floors slippery. If you must use wax, use non-skid floor wax. Do not have throw  rugs and other things on the floor that can make you trip. What can I do with my stairs? Do not leave any items on the stairs. Make sure that there are handrails on both sides of the stairs and use them. Fix handrails that are broken or loose. Make sure that handrails are as long as the stairways. Check any carpeting to make sure that it is firmly attached to the stairs. Fix any carpet that is loose or worn. Avoid having throw rugs at the top or bottom of the stairs. If you do have throw rugs, attach them to the floor with carpet tape. Make sure that you have a light switch at the top of the stairs and the bottom of the stairs. If you do not have them, ask someone to add them for you. What else can I do to help prevent falls? Wear shoes that: Do not have high heels. Have rubber bottoms. Are comfortable and fit you well. Are closed at the toe. Do not  wear sandals. If you use a stepladder: Make sure that it is fully opened. Do not climb a closed stepladder. Make sure that both sides of the stepladder are locked into place. Ask someone to hold it for you, if possible. Clearly mark and make sure that you can see: Any grab bars or handrails. First and last steps. Where the edge of each step is. Use tools that help you move around (mobility aids) if they are needed. These include: Canes. Walkers. Scooters. Crutches. Turn on the lights when you go into a dark area. Replace any light bulbs as soon as they burn out. Set up your furniture so you have a clear path. Avoid moving your furniture around. If any of your floors are uneven, fix them. If there are any pets around you, be aware of where they are. Review your medicines with your doctor. Some medicines can make you feel dizzy. This can increase your chance of falling. Ask your doctor what other things that you can do to help prevent falls. This information is not intended to replace advice given to you by your health care provider. Make sure you discuss any questions you have with your health care provider. Document Released: 04/02/2009 Document Revised: 11/12/2015 Document Reviewed: 07/11/2014 Elsevier Interactive Patient Education  2017 [employer].

## 2022-05-06 ENCOUNTER — Other Ambulatory Visit: Payer: Self-pay | Admitting: Internal Medicine

## 2022-05-06 DIAGNOSIS — E785 Hyperlipidemia, unspecified: Secondary | ICD-10-CM

## 2022-06-04 ENCOUNTER — Other Ambulatory Visit: Payer: Self-pay

## 2022-06-04 ENCOUNTER — Encounter (HOSPITAL_COMMUNITY): Payer: Self-pay

## 2022-06-04 ENCOUNTER — Emergency Department (HOSPITAL_COMMUNITY)
Admission: EM | Admit: 2022-06-04 | Discharge: 2022-06-05 | Disposition: A | Discharge status: Home / Self Care | Payer: Medicare Other | Attending: Emergency Medicine | Admitting: Emergency Medicine

## 2022-06-04 DIAGNOSIS — R112 Nausea with vomiting, unspecified: Secondary | ICD-10-CM | POA: Insufficient documentation

## 2022-06-04 DIAGNOSIS — R197 Diarrhea, unspecified: Secondary | ICD-10-CM | POA: Insufficient documentation

## 2022-06-04 DIAGNOSIS — E876 Hypokalemia: Secondary | ICD-10-CM | POA: Insufficient documentation

## 2022-06-04 DIAGNOSIS — I129 Hypertensive chronic kidney disease with stage 1 through stage 4 chronic kidney disease, or unspecified chronic kidney disease: Secondary | ICD-10-CM | POA: Diagnosis not present

## 2022-06-04 DIAGNOSIS — N3000 Acute cystitis without hematuria: Secondary | ICD-10-CM | POA: Diagnosis not present

## 2022-06-04 DIAGNOSIS — N183 Chronic kidney disease, stage 3 unspecified: Secondary | ICD-10-CM | POA: Insufficient documentation

## 2022-06-04 DIAGNOSIS — Z8571 Personal history of Hodgkin lymphoma: Secondary | ICD-10-CM | POA: Insufficient documentation

## 2022-06-04 DIAGNOSIS — Z20822 Contact with and (suspected) exposure to covid-19: Secondary | ICD-10-CM | POA: Diagnosis not present

## 2022-06-04 LAB — CBC
HCT: 46.7 % — ABNORMAL HIGH (ref 36.0–46.0)
Hemoglobin: 15.3 g/dL — ABNORMAL HIGH (ref 12.0–15.0)
MCH: 32.1 pg (ref 26.0–34.0)
MCHC: 32.8 g/dL (ref 30.0–36.0)
MCV: 97.9 fL (ref 80.0–100.0)
Platelets: 198 10*3/uL (ref 150–400)
RBC: 4.77 MIL/uL (ref 3.87–5.11)
RDW: 13.3 % (ref 11.5–15.5)
WBC: 9.4 10*3/uL (ref 4.0–10.5)
nRBC: 0 % (ref 0.0–0.2)

## 2022-06-04 LAB — COMPREHENSIVE METABOLIC PANEL
ALT: 28 U/L (ref 0–44)
AST: 37 U/L (ref 15–41)
Albumin: 4.5 g/dL (ref 3.5–5.0)
Alkaline Phosphatase: 75 U/L (ref 38–126)
Anion gap: 11 (ref 5–15)
BUN: 19 mg/dL (ref 8–23)
CO2: 25 mmol/L (ref 22–32)
Calcium: 9.8 mg/dL (ref 8.9–10.3)
Chloride: 107 mmol/L (ref 98–111)
Creatinine, Ser: 0.91 mg/dL (ref 0.44–1.00)
GFR, Estimated: >60 mL/min (ref >60)
Glucose, Bld: 104 mg/dL — ABNORMAL HIGH (ref 70–99)
Potassium: 3.2 mmol/L — ABNORMAL LOW (ref 3.5–5.1)
Sodium: 143 mmol/L (ref 135–145)
Total Bilirubin: 1.3 mg/dL — ABNORMAL HIGH (ref 0.3–1.2)
Total Protein: 7.9 g/dL (ref 6.5–8.1)

## 2022-06-04 LAB — URINALYSIS, ROUTINE W REFLEX MICROSCOPIC
Bilirubin Urine: NEGATIVE
Glucose, UA: NEGATIVE mg/dL
Ketones, ur: 5 mg/dL — AB
Nitrite: NEGATIVE
Protein, ur: 30 mg/dL — AB
Specific Gravity, Urine: 1.023 (ref 1.005–1.030)
pH: 5 (ref 5.0–8.0)

## 2022-06-04 LAB — LIPASE, BLOOD: Lipase: 35 U/L (ref 11–51)

## 2022-06-04 NOTE — ED Triage Notes (Signed)
States that she thinks she has food poisoning has been vomiting and had diarrhea since this morning

## 2022-06-04 NOTE — ED Provider Triage Note (Signed)
Emergency Medicine Provider Triage Evaluation Note  Jamie Villa , a 76 y.o. female  was evaluated in triage.  Pt complains of vomiting and diarrhea starting this morning. Ate at [employer] last night and thinks she got food poisoning. Lost count of episodes of emesis and diarrhea. Non bloody.   Review of Systems  Positive: Abd pain, nausea, vomiting, diarrhea Negative: Fever, urinary sx  Physical Exam  BP (!) 136/91 (BP Location: Right Arm)   Pulse (!) 109   Temp 98.3 F (36.8 C) (Oral)   Resp 17   Ht '5\' 7"'$  (1.702 m)   Wt 68 kg   SpO2 100%   BMI 23.49 kg/m  Gen:   Awake, no distress   Resp:  Normal effort  MSK:   Moves extremities without difficulty  Other:    Medical Decision Making  Medically screening exam initiated at 8:30 PM.  Appropriate orders placed.  Jamie Villa was informed that the remainder of the evaluation will be completed by another provider, this initial triage assessment does not replace that evaluation, and the importance of remaining in the ED until their evaluation is complete.  Workup initiated   Kateri Plummer, PA-C 06/04/22 2031

## 2022-06-05 LAB — RESP PANEL BY RT-PCR (RSV, FLU A&B, COVID)  RVPGX2
Influenza A by PCR: NEGATIVE
Influenza B by PCR: NEGATIVE
Resp Syncytial Virus by PCR: NEGATIVE
SARS Coronavirus 2 by RT PCR: NEGATIVE

## 2022-06-05 MED ORDER — ONDANSETRON 4 MG PO TBDP: 4.0000 mg | ORAL_TABLET | Freq: Three times a day (TID) | ORAL | 0 refills | Status: DC | PRN

## 2022-06-05 MED ORDER — LACTATED RINGERS IV BOLUS
1000.0000 mL | Freq: Once | INTRAVENOUS | Status: DC
Start: 2022-06-05 — End: 2022-06-05

## 2022-06-05 MED ORDER — ONDANSETRON HCL 4 MG/2ML IJ SOLN
4.0000 mg | Freq: Once | INTRAMUSCULAR | Status: AC
  Administered 2022-06-05: 4 mg via INTRAVENOUS
  Filled 2022-06-05: qty 2

## 2022-06-05 MED ORDER — SODIUM CHLORIDE 0.9 % IV SOLN: Freq: Once | INTRAVENOUS | Status: AC

## 2022-06-05 MED ORDER — SODIUM CHLORIDE 0.9 % IV SOLN
1.0000 g | Freq: Once | INTRAVENOUS | Status: AC
  Administered 2022-06-05: 1 g via INTRAVENOUS
  Filled 2022-06-05: qty 10

## 2022-06-05 MED ORDER — SULFAMETHOXAZOLE-TRIMETHOPRIM 800-160 MG PO TABS: 1.0000 | ORAL_TABLET | Freq: Two times a day (BID) | ORAL | 0 refills | Status: AC

## 2022-06-05 NOTE — ED Provider Notes (Signed)
[facility] DEPT Provider Note   CSN: [id number] Arrival date & time: 06/04/22  1829     History {Add pertinent medical, surgical, social history, OB history to HPI:1} Chief Complaint  Patient presents with   Nausea   Emesis    Jamie Villa is a 76 y.o. female.  HPI     Home Medications Prior to Admission medications   Medication Sig Start Date End Date Taking? Authorizing Provider  baclofen (LIORESAL) 10 MG tablet Take 1 tablet (10 mg total) by mouth 3 (three) times daily. As needed for muscle spasm Patient not taking: Reported on 04/12/2022 11/01/21   Ventura Bruns, PA-C  Cholecalciferol 50 MCG (2000 UT) TABS Take 1 tablet (2,000 Units total) by mouth daily. 10/23/19   [name], MD  furosemide (LASIX) 20 MG tablet TAKE 1 TABLET(20 MG) BY MOUTH DAILY Patient not taking: Reported on 04/12/2022 04/09/22   [name], MD  meclizine (ANTIVERT) 25 MG tablet Take 25 mg by mouth 3 (three) times daily as needed for dizziness. Patient not taking: Reported on 04/12/2022    [provider]  olmesartan (BENICAR) 20 MG tablet TAKE 1 TABLET(20 MG) BY MOUTH DAILY 02/05/22   [name], MD  ondansetron (ZOFRAN) 4 MG tablet Take 1 tablet (4 mg total) by mouth every 8 (eight) hours as needed for nausea or vomiting. Patient not taking: Reported on 04/12/2022 11/01/21   Ventura Bruns, PA-C  oxyCODONE (ROXICODONE) 5 MG immediate release tablet Take 1-2 tablets (5-10 mg total) by mouth every 4 (four) hours as needed for severe pain. Patient not taking: Reported on 04/12/2022 11/01/21   Merlene Pulling K, PA-C  rosuvastatin (CRESTOR) 10 MG tablet TAKE 1 TABLET(10 MG) BY MOUTH DAILY 05/06/22   [name], MD  UBRELVY 50 MG TABS TAKE 1 TABLET BY MOUTH DAILY AS NEEDED Patient not taking: Reported on 04/12/2022 03/04/21   [name], MD      Allergies    Codeine and Penicillins    Review of Systems   Review of  Systems  Physical Exam Updated Vital Signs BP (!) 136/91 (BP Location: Right Arm)   Pulse (!) 109   Temp 98.3 F (36.8 C) (Oral)   Resp 17   Ht '5\' 7"'$  (1.702 m)   Wt 68 kg   SpO2 100%   BMI 23.49 kg/m  Physical Exam  ED Results / Procedures / Treatments   Labs (all labs ordered are listed, but only abnormal results are displayed) Labs Reviewed  COMPREHENSIVE METABOLIC PANEL - Abnormal; Notable for the following components:      Result Value   Potassium 3.2 (*)    Glucose, Bld 104 (*)    Total Bilirubin 1.3 (*)    All other components within normal limits  CBC - Abnormal; Notable for the following components:   Hemoglobin 15.3 (*)    HCT 46.7 (*)    All other components within normal limits  URINALYSIS, ROUTINE W REFLEX MICROSCOPIC - Abnormal; Notable for the following components:   Color, Urine AMBER (*)    APPearance CLOUDY (*)    Hgb urine dipstick SMALL (*)    Ketones, ur 5 (*)    Protein, ur 30 (*)    Leukocytes,Ua LARGE (*)    Bacteria, UA RARE (*)    Non Squamous Epithelial 0-5 (*)    All other components within normal limits  LIPASE, BLOOD    EKG None  Radiology No  results found.  Procedures Procedures  {Document cardiac monitor, telemetry assessment procedure when appropriate:1}  Medications Ordered in ED Medications - No data to display  ED Course/ Medical Decision Making/ A&P                           Medical Decision Making  ***  {Document critical care time when appropriate:1} {Document review of labs and clinical decision tools ie heart score, Chads2Vasc2 etc:1}  {Document your independent review of radiology images, and any outside records:1} {Document your discussion with family members, caretakers, and with consultants:1} {Document social determinants of health affecting pt's care:1} {Document your decision making why or why not admission, treatments were needed:1} Final Clinical Impression(s) / ED Diagnoses Final diagnoses:  None     Rx / DC Orders ED Discharge Orders     None

## 2022-06-05 NOTE — Discharge Instructions (Signed)
You are seen in the ER today for your nausea vomiting diarrhea.  You likely have a foodborne illness.  You should increase your hydration for the next few days, you may use the prescribed nausea medication as needed.  Additionally you are found to have a urinary tract infection.  You may take the prescribed antibiotics for the next 3 days as well.  Follow-up with primary care doctor and return to the ER with any new severe symptoms

## 2022-06-05 NOTE — ED Notes (Signed)
Lab is adding on urine culture.

## 2022-06-06 LAB — URINE CULTURE

## 2022-06-08 ENCOUNTER — Telehealth: Payer: Self-pay | Admitting: Internal Medicine

## 2022-06-08 NOTE — Telephone Encounter (Signed)
Patient called because she had went to the hospital and they did lab work an she wanted to see if Dr Ronnald Ramp could review and for someone to call back as soon as possible. Call back is [phone]

## 2022-06-09 ENCOUNTER — Emergency Department (HOSPITAL_COMMUNITY): Payer: Medicare Other

## 2022-06-09 ENCOUNTER — Emergency Department (HOSPITAL_COMMUNITY)
Admission: EM | Admit: 2022-06-09 | Discharge: 2022-06-09 | Disposition: A | Discharge status: Home / Self Care | Payer: Medicare Other | Attending: Emergency Medicine | Admitting: Emergency Medicine

## 2022-06-09 DIAGNOSIS — I129 Hypertensive chronic kidney disease with stage 1 through stage 4 chronic kidney disease, or unspecified chronic kidney disease: Secondary | ICD-10-CM | POA: Diagnosis not present

## 2022-06-09 DIAGNOSIS — R509 Fever, unspecified: Secondary | ICD-10-CM | POA: Diagnosis not present

## 2022-06-09 DIAGNOSIS — N189 Chronic kidney disease, unspecified: Secondary | ICD-10-CM | POA: Diagnosis not present

## 2022-06-09 DIAGNOSIS — R197 Diarrhea, unspecified: Secondary | ICD-10-CM | POA: Diagnosis not present

## 2022-06-09 DIAGNOSIS — Z8572 Personal history of non-Hodgkin lymphomas: Secondary | ICD-10-CM | POA: Insufficient documentation

## 2022-06-09 DIAGNOSIS — K449 Diaphragmatic hernia without obstruction or gangrene: Secondary | ICD-10-CM | POA: Diagnosis not present

## 2022-06-09 DIAGNOSIS — K6389 Other specified diseases of intestine: Secondary | ICD-10-CM | POA: Diagnosis not present

## 2022-06-09 DIAGNOSIS — R112 Nausea with vomiting, unspecified: Secondary | ICD-10-CM | POA: Diagnosis not present

## 2022-06-09 DIAGNOSIS — R110 Nausea: Secondary | ICD-10-CM | POA: Diagnosis not present

## 2022-06-09 DIAGNOSIS — Z79899 Other long term (current) drug therapy: Secondary | ICD-10-CM | POA: Insufficient documentation

## 2022-06-09 DIAGNOSIS — E039 Hypothyroidism, unspecified: Secondary | ICD-10-CM | POA: Diagnosis not present

## 2022-06-09 LAB — LIPASE, BLOOD: Lipase: 38 U/L (ref 11–51)

## 2022-06-09 LAB — CBC WITH DIFFERENTIAL/PLATELET
Abs Immature Granulocytes: 0.01 10*3/uL (ref 0.00–0.07)
Basophils Absolute: 0 10*3/uL (ref 0.0–0.1)
Basophils Relative: 0 %
Eosinophils Absolute: 0 10*3/uL (ref 0.0–0.5)
Eosinophils Relative: 0 %
HCT: 43.9 % (ref 36.0–46.0)
Hemoglobin: 14.6 g/dL (ref 12.0–15.0)
Immature Granulocytes: 0 %
Lymphocytes Relative: 38 %
Lymphs Abs: 2.2 10*3/uL (ref 0.7–4.0)
MCH: 32.4 pg (ref 26.0–34.0)
MCHC: 33.3 g/dL (ref 30.0–36.0)
MCV: 97.3 fL (ref 80.0–100.0)
Monocytes Absolute: 0.6 10*3/uL (ref 0.1–1.0)
Monocytes Relative: 9 %
Neutro Abs: 3.1 10*3/uL (ref 1.7–7.7)
Neutrophils Relative %: 53 %
Platelets: 172 10*3/uL (ref 150–400)
RBC: 4.51 MIL/uL (ref 3.87–5.11)
RDW: 12.9 % (ref 11.5–15.5)
WBC: 5.9 10*3/uL (ref 4.0–10.5)
nRBC: 0 % (ref 0.0–0.2)

## 2022-06-09 LAB — COMPREHENSIVE METABOLIC PANEL
ALT: 28 U/L (ref 0–44)
AST: 32 U/L (ref 15–41)
Albumin: 4 g/dL (ref 3.5–5.0)
Alkaline Phosphatase: 58 U/L (ref 38–126)
Anion gap: 11 (ref 5–15)
BUN: 13 mg/dL (ref 8–23)
CO2: 21 mmol/L — ABNORMAL LOW (ref 22–32)
Calcium: 9.8 mg/dL (ref 8.9–10.3)
Chloride: 111 mmol/L (ref 98–111)
Creatinine, Ser: 1.15 mg/dL — ABNORMAL HIGH (ref 0.44–1.00)
GFR, Estimated: 49 mL/min — ABNORMAL LOW (ref >60)
Glucose, Bld: 86 mg/dL (ref 70–99)
Potassium: 3.5 mmol/L (ref 3.5–5.1)
Sodium: 143 mmol/L (ref 135–145)
Total Bilirubin: 1.3 mg/dL — ABNORMAL HIGH (ref 0.3–1.2)
Total Protein: 7.1 g/dL (ref 6.5–8.1)

## 2022-06-09 LAB — LACTIC ACID, PLASMA: Lactic Acid, Venous: 1.7 mmol/L (ref 0.5–1.9)

## 2022-06-09 MED ORDER — IOHEXOL 300 MG/ML  SOLN
100.0000 mL | Freq: Once | INTRAMUSCULAR | Status: AC | PRN
  Administered 2022-06-09: 100 mL via INTRAVENOUS

## 2022-06-09 MED ORDER — ONDANSETRON HCL 4 MG PO TABS: 4.0000 mg | ORAL_TABLET | Freq: Three times a day (TID) | ORAL | 0 refills | Status: DC | PRN

## 2022-06-09 MED ORDER — ONDANSETRON HCL 4 MG/2ML IJ SOLN
4.0000 mg | Freq: Once | INTRAMUSCULAR | Status: AC
  Administered 2022-06-09: 4 mg via INTRAVENOUS
  Filled 2022-06-09: qty 2

## 2022-06-09 MED ORDER — SODIUM CHLORIDE 0.9 % IV BOLUS
1000.0000 mL | Freq: Once | INTRAVENOUS | Status: AC
  Administered 2022-06-09: 1000 mL via INTRAVENOUS

## 2022-06-09 NOTE — ED Provider Notes (Signed)
[facility] DEPT Provider Note   CSN: [id number] Arrival date & time: 06/09/22  1701     History  Chief Complaint  Patient presents with   Nausea   Emesis   Diarrhea    Jamie Villa is a 76 y.o. female.  The history is provided by the patient, medical records and a significant other. No language interpreter was used.  Emesis Severity:  Moderate Progression:  Improving Recent urination:  Normal Relieved by:  Nothing Worsened by:  Nothing Associated symptoms: diarrhea   Associated symptoms: no abdominal pain (resolved now), no chills, no cough, no fever, no headaches and no myalgias   Diarrhea:    Quality:  Watery   Number of occurrences:  Numerous   Severity:  Moderate   Duration:  1 week   Timing:  Constant   Progression:  Unchanged Diarrhea Associated symptoms: vomiting   Associated symptoms: no abdominal pain (resolved now), no chills, no diaphoresis, no fever, no headaches and no myalgias        Home Medications Prior to Admission medications   Medication Sig Start Date End Date Taking? Authorizing Provider  baclofen (LIORESAL) 10 MG tablet Take 1 tablet (10 mg total) by mouth 3 (three) times daily. As needed for muscle spasm Patient not taking: Reported on 04/12/2022 11/01/21   Ventura Bruns, PA-C  Cholecalciferol 50 MCG (2000 UT) TABS Take 1 tablet (2,000 Units total) by mouth daily. 10/23/19   [name], MD  furosemide (LASIX) 20 MG tablet TAKE 1 TABLET(20 MG) BY MOUTH DAILY Patient not taking: Reported on 04/12/2022 04/09/22   [name], MD  meclizine (ANTIVERT) 25 MG tablet Take 25 mg by mouth 3 (three) times daily as needed for dizziness. Patient not taking: Reported on 04/12/2022    [provider]  olmesartan (BENICAR) 20 MG tablet TAKE 1 TABLET(20 MG) BY MOUTH DAILY 02/05/22   [name], MD  ondansetron (ZOFRAN-ODT) 4 MG disintegrating tablet Take 1 tablet (4 mg total) by mouth every 8  (eight) hours as needed for nausea or vomiting. 06/05/22   Sponseller, Eugene Garnet R, PA-C  oxyCODONE (ROXICODONE) 5 MG immediate release tablet Take 1-2 tablets (5-10 mg total) by mouth every 4 (four) hours as needed for severe pain. Patient not taking: Reported on 04/12/2022 11/01/21   Merlene Pulling K, PA-C  rosuvastatin (CRESTOR) 10 MG tablet TAKE 1 TABLET(10 MG) BY MOUTH DAILY 05/06/22   [name], MD  UBRELVY 50 MG TABS TAKE 1 TABLET BY MOUTH DAILY AS NEEDED Patient not taking: Reported on 04/12/2022 03/04/21   [name], MD      Allergies    Codeine and Penicillins    Review of Systems   Review of Systems  Constitutional:  Positive for fatigue. Negative for chills, diaphoresis and fever.  HENT:  Negative for congestion.   Respiratory:  Negative for cough, chest tightness, shortness of breath and wheezing.   Cardiovascular:  Negative for chest pain, palpitations and leg swelling.  Gastrointestinal:  Positive for diarrhea, nausea and vomiting. Negative for abdominal distention, abdominal pain (resolved now) and constipation.  Genitourinary:  Negative for dysuria and frequency.  Musculoskeletal:  Negative for back pain, myalgias, neck pain and neck stiffness.  Skin:  Negative for rash and wound.  Neurological:  Negative for weakness, light-headedness, numbness and headaches.  Psychiatric/Behavioral:  Negative for agitation and confusion.   All other systems reviewed and are negative.   Physical Exam Updated Vital Signs BP Marland Kitchen)  153/76   Pulse 77   Temp (!) 97 F (36.1 C)   Resp 18   SpO2 99%  Physical Exam Vitals and nursing note reviewed.  Constitutional:      General: She is not in acute distress.    Appearance: She is well-developed. She is not ill-appearing, toxic-appearing or diaphoretic.  HENT:     Head: Normocephalic and atraumatic.     Nose: Nose normal.     Mouth/Throat:     Mouth: Mucous membranes are dry.     Pharynx: No oropharyngeal exudate or posterior  oropharyngeal erythema.  Eyes:     Extraocular Movements: Extraocular movements intact.     Conjunctiva/sclera: Conjunctivae normal.     Pupils: Pupils are equal, round, and reactive to light.  Cardiovascular:     Rate and Rhythm: Normal rate and regular rhythm.     Heart sounds: No murmur heard. Pulmonary:     Effort: Pulmonary effort is normal. No respiratory distress.     Breath sounds: Normal breath sounds. No wheezing, rhonchi or rales.  Chest:     Chest wall: No tenderness.  Abdominal:     General: Abdomen is flat. There is no distension.     Palpations: Abdomen is soft.     Tenderness: There is no abdominal tenderness. There is no right CVA tenderness, left CVA tenderness, guarding or rebound.  Musculoskeletal:        General: No swelling or tenderness.     Cervical back: Neck supple.     Right lower leg: No edema.     Left lower leg: No edema.  Skin:    General: Skin is warm and dry.     Capillary Refill: Capillary refill takes less than 2 seconds.     Coloration: Skin is not pale.     Findings: No erythema or rash.  Neurological:     General: No focal deficit present.     Mental Status: She is alert. Mental status is at baseline.  Psychiatric:        Mood and Affect: Mood normal.     ED Results / Procedures / Treatments   Labs (all labs ordered are listed, but only abnormal results are displayed) Labs Reviewed  COMPREHENSIVE METABOLIC PANEL - Abnormal; Notable for the following components:      Result Value   CO2 21 (*)    Creatinine, Ser 1.15 (*)    Total Bilirubin 1.3 (*)    GFR, Estimated 49 (*)    All other components within normal limits  C DIFFICILE QUICK SCREEN W PCR REFLEX    CBC WITH DIFFERENTIAL/PLATELET  LIPASE, BLOOD  LACTIC ACID, PLASMA  URINALYSIS, ROUTINE W REFLEX MICROSCOPIC  LACTIC ACID, PLASMA    EKG None  Radiology CT ABDOMEN PELVIS W CONTRAST  Result Date: 06/09/2022 CLINICAL DATA:  History of non-Hodgkin's lymphoma with  several day history of nausea, vomiting, and diarrhea EXAM: CT ABDOMEN AND PELVIS WITH CONTRAST TECHNIQUE: Multidetector CT imaging of the abdomen and pelvis was performed using the standard protocol following bolus administration of intravenous contrast. RADIATION DOSE REDUCTION: This exam was performed according to the departmental dose-optimization program which includes automated exposure control, adjustment of the mA and/or kV according to patient size and/or use of iterative reconstruction technique. CONTRAST:  155m OMNIPAQUE IOHEXOL 300 MG/ML  SOLN COMPARISON:  None Available. FINDINGS: Lower chest: No focal consolidation or pulmonary nodule in the lung bases. No pleural effusion or pneumothorax demonstrated. Partially imaged heart size is normal.  Coronary artery calcifications. Hepatobiliary: No focal hepatic lesions. No intra or extrahepatic biliary ductal dilation. Cholecystectomy. Pancreas: No focal lesions or main ductal dilation. Spleen: Normal in size without focal abnormality. Adrenals/Urinary Tract: No adrenal nodules. No suspicious renal mass, calculi or hydronephrosis. No focal bladder wall thickening. Stomach/Bowel: Small hiatal hernia. Normal appearance of the stomach. No bowel dilation or inflammatory changes. Mild mural thickening of the descending and rectosigmoid colon, likely related to underdistention. Normal appendix. Vascular/Lymphatic: Aortic atherosclerosis. No enlarged abdominal or pelvic lymph nodes. Reproductive: No adnexal masses. Other: No free fluid, fluid collection, or free air. Musculoskeletal: No acute or abnormal lytic or blastic osseous lesions. Grade 1 anterolisthesis at L4-5. IMPRESSION: 1. No acute infectious/inflammatory findings in the abdomen or pelvis. 2. Small hiatal hernia. 3. Coronary artery calcifications. Aortic Atherosclerosis (ICD10-I70.0). Electronically Signed   By: Darrin Nipper M.D.   On: 06/09/2022 18:58    Procedures Procedures    Medications Ordered  in ED Medications  iohexol (OMNIPAQUE) 300 MG/ML solution 100 mL (100 mLs Intravenous Contrast Given 06/09/22 1808)  ondansetron (ZOFRAN) injection 4 mg (4 mg Intravenous Given 06/09/22 2006)  sodium chloride 0.9 % bolus 1,000 mL (0 mLs Intravenous Stopped 06/09/22 2013)    ED Course/ Medical Decision Making/ A&P                           Medical Decision Making Risk Prescription drug management.    Jamie Villa is a 76 y.o. female with a past medical history significant for hypertension, dyslipidemia, CKD, hypothyroidism, previous non-Hodgkin lymphoma, he was cholecystectomy, and previous hip surgery who presents with nausea, vomiting, diarrhea, and some abdominal cramping.  Patient reports that for the last 7 days, patient has been having GI symptoms with frequent diarrhea and initially had some nausea and vomiting.  The vomiting is improved which is showing some nausea.  She reports that she thought it may have been from some collards at a restaurant that she was only 1 who ate around her from a viral standpoint.  She denies any fevers and denies any chest pain or shortness of breath.  Denies cough.  She reports some early cramping but that has also improved.  She is just simply having the diarrhea and feeling that she is getting dehydrated.  She reports no new or worsened pain in her knee and denies other complaints.  She was very concerned about her symptoms persistence and that she was getting dehydrated.  She went to the hospital several days ago and was given a 3-day course of antibiotics that she completed for possible UTI although she denies any urinary symptoms.  She reports that she had dental work several weeks ago and had a prolonged course of antibiotics to prevent infection and her recently operated on knee.  Patient is otherwise resting.  On exam, lungs clear and chest nontender.  Abdomen was nontender and had normal bowel sounds on exam.  Legs nontender and she has scar  on her left knee from previous surgery.  Is not exquisitely tender.  Intact sensation, strength, and pulses.  Vital signs had hypertension but otherwise reassuring on my assessment.  She is afebrile.  She had no rash on her abdomen and there was no guarding or rebound tenderness.  Clinically I suspect patient has a diarrheal illness causing her feeling of dehydration and GI symptoms.  Given her recent antibiotics for both dental prophylaxis and UTI, given the 1 week of diarrhea we  will check a C. difficile test.  Will give her fluids for suspected dehydration.  She had a CT scan and labs from triage that were overall reassuring.  Patient had urinalysis ordered but is denying urinary symptoms so we will hold on urinalysis if she has no urinary symptoms at this time.  If workup remains reassuring and she feels better after fluids, anticipate discharge for outpatient management and follow-up.    10:03 PM   Patient was unable to produce a stool sample despite several hours of observation.  After fluids she is still not tachycardic and is well-appearing.  Patient's resting comfortably and in no distress.  Went through all of the workup together and we feel safe with patient getting discharge.  She does not want to wait for urinalysis or repeat lactic.  Patient will follow-up with her PCP and we will give her prescription for some nausea medicine.  Suspect viral GI illness versus other although now that there is no further diarrhea will defer to outpatient team if they want to check for C. difficile.  Patient agrees with this plan and with discharge plan.  Patient will be discharged after PO challenge.              Final Clinical Impression(s) / ED Diagnoses Final diagnoses:  Diarrhea, unspecified type  Nausea    Rx / DC Orders ED Discharge Orders          Ordered    ondansetron (ZOFRAN) 4 MG tablet  Every 8 hours PRN        06/09/22 2253            Clinical Impression: 1.  Diarrhea, unspecified type   2. Nausea     Disposition: Discharge  Condition: Good  I have discussed the results, Dx and Tx plan with the pt(& family if present). He/she/they expressed understanding and agree(s) with the plan. Discharge instructions discussed at great length. Strict return precautions discussed and pt &/or family have verbalized understanding of the instructions. No further questions at time of discharge.    New Prescriptions   ONDANSETRON (ZOFRAN) 4 MG TABLET    Take 1 tablet (4 mg total) by mouth every 8 (eight) hours as needed for nausea or vomiting.    Follow Up: [name], MD [place] [address] [address] [place] DEPT [place] [id number] mc [place]        Anett Ranker, Gwenyth Allegra, MD 06/09/22 [phone]

## 2022-06-09 NOTE — Telephone Encounter (Signed)
Pt has been scheduled for 12/22 for Er follow up. However pt's husband stated that he may be taking her back to the ER this evening.

## 2022-06-09 NOTE — ED Triage Notes (Signed)
Patient here from home reporting n/v/d since Sat. Given antibiotics which she has finished for UTI but states symptoms are worse.

## 2022-06-09 NOTE — ED Provider Triage Note (Signed)
Emergency Medicine Provider Triage Evaluation Note  Jamie Villa , a 76 y.o. female  was evaluated in triage.  Pt complains of nausea, diarrhea for the past 12 hours.  Diagnosed with a urinary tract infection on Friday, reports she feels that she does not have a urinary tract infection.  Is not complaining of abdominal pain but states that she feels like the food poisoning is ongoing.  She has not been able to keep anything down, did have yogurt last night.  Has been running a low-grade temp at home.  Of note, according to prior visit no CT was obtained during her er visit.   Review of Systems  Positive: Fever,Nausea, diarrhea Negative: Cp,sob, abdominal pain  Physical Exam  BP (!) 153/76   Pulse 77   Temp (!) 97 F (36.1 C)   Resp 18   SpO2 99%  Gen:   Awake, no distress   Resp:  Normal effort  MSK:   Moves extremities without difficulty  Other:    Medical Decision Making  Medically screening exam initiated at 5:18 PM.  Appropriate orders placed.  Jamie Villa was informed that the remainder of the evaluation will be completed by another provider, this initial triage assessment does not replace that evaluation, and the importance of remaining in the ED until their evaluation is complete.     [name], PA-C 06/09/22 1724

## 2022-06-09 NOTE — Telephone Encounter (Signed)
Patient called back and wants you to call her husband:  [phone]

## 2022-06-09 NOTE — Discharge Instructions (Signed)
Your history, exam, workup today led Jamie Villa to getting labs and a CT scan that was overall reassuring.  Unfortunately you unable to produce a stool sample for Jamie Villa to send off the C. difficile testing given the recent antibiotic use however, if your symptoms persist please follow-up with your primary doctor and they may consider that testing.  Please rest and stay hydrated and use the nausea medicine to help maintain hydration.  If you start having more diarrhea, you may consider using Imodium.  If any symptoms rapidly change or worsen, please return to the nearest emergency department.

## 2022-06-09 NOTE — ED Notes (Signed)
Needs IV for CT please

## 2022-06-10 ENCOUNTER — Ambulatory Visit: Payer: Medicare Other | Admitting: Internal Medicine

## 2022-06-10 LAB — URINALYSIS, ROUTINE W REFLEX MICROSCOPIC
Bilirubin Urine: NEGATIVE
Glucose, UA: NEGATIVE mg/dL
Hgb urine dipstick: NEGATIVE
Ketones, ur: 15 mg/dL — AB
Leukocytes,Ua: NEGATIVE
Nitrite: NEGATIVE
Protein, ur: NEGATIVE mg/dL
Specific Gravity, Urine: <1.005 — ABNORMAL LOW (ref 1.005–1.030)
pH: 6.5 (ref 5.0–8.0)

## 2022-07-08 ENCOUNTER — Ambulatory Visit
Admission: EM | Admit: 2022-07-08 | Discharge: 2022-07-08 | Disposition: A | Discharge status: Home / Self Care | Payer: Medicare Other | Attending: Urgent Care | Admitting: Urgent Care

## 2022-07-08 DIAGNOSIS — B354 Tinea corporis: Secondary | ICD-10-CM

## 2022-07-08 DIAGNOSIS — I10 Essential (primary) hypertension: Secondary | ICD-10-CM | POA: Diagnosis not present

## 2022-07-08 DIAGNOSIS — M1712 Unilateral primary osteoarthritis, left knee: Secondary | ICD-10-CM | POA: Diagnosis not present

## 2022-07-08 MED ORDER — KETOCONAZOLE 2 % EX CREA: 1.0000 | TOPICAL_CREAM | Freq: Every day | CUTANEOUS | 0 refills | Status: DC

## 2022-07-08 NOTE — ED Provider Notes (Signed)
[facility]  Note:  This document was prepared using Systems analyst and may include unintentional dictation errors.  MRN: [id number] DOB: 18-May-1946  Subjective:   Jamie Villa is a 77 y.o. female presenting for 2-week history of a persistent mildly itchy rash over the left lower leg.  No fever, drainage of pus or bleeding.  Patient had knee replacement surgery over the summer.  No headache, confusion, dizziness, weakness, numbness or tingling.  She is compliant with her blood pressure medications.  Unfortunately, she is in the process of seeing a new primary care provider as she is not happy with her management.  No history of stroke, heart disease.  No current facility-administered medications for this encounter.  Current Outpatient Medications:    baclofen (LIORESAL) 10 MG tablet, Take 1 tablet (10 mg total) by mouth 3 (three) times daily. As needed for muscle spasm (Patient not taking: Reported on 04/12/2022), Disp: 15 tablet, Rfl: 0   Cholecalciferol 50 MCG (2000 UT) TABS, Take 1 tablet (2,000 Units total) by mouth daily., Disp: 90 tablet, Rfl: 1   furosemide (LASIX) 20 MG tablet, TAKE 1 TABLET(20 MG) BY MOUTH DAILY (Patient not taking: Reported on 04/12/2022), Disp: 90 tablet, Rfl: 0   meclizine (ANTIVERT) 25 MG tablet, Take 25 mg by mouth 3 (three) times daily as needed for dizziness. (Patient not taking: Reported on 04/12/2022), Disp: , Rfl:    olmesartan (BENICAR) 20 MG tablet, TAKE 1 TABLET(20 MG) BY MOUTH DAILY, Disp: 90 tablet, Rfl: 1   ondansetron (ZOFRAN) 4 MG tablet, Take 1 tablet (4 mg total) by mouth every 8 (eight) hours as needed for nausea or vomiting., Disp: 12 tablet, Rfl: 0   ondansetron (ZOFRAN-ODT) 4 MG disintegrating tablet, Take 1 tablet (4 mg total) by mouth every 8 (eight) hours as needed for nausea or vomiting., Disp: 10 tablet, Rfl: 0   oxyCODONE (ROXICODONE) 5 MG immediate release tablet, Take 1-2 tablets (5-10 mg  total) by mouth every 4 (four) hours as needed for severe pain. (Patient not taking: Reported on 04/12/2022), Disp: 30 tablet, Rfl: 0   rosuvastatin (CRESTOR) 10 MG tablet, TAKE 1 TABLET(10 MG) BY MOUTH DAILY, Disp: 90 tablet, Rfl: 1   UBRELVY 50 MG TABS, TAKE 1 TABLET BY MOUTH DAILY AS NEEDED (Patient not taking: Reported on 04/12/2022), Disp: 10 tablet, Rfl: 2   Allergies  Allergen Reactions   Codeine Other (See Comments)    Unknown   Penicillins Hives and Other (See Comments)    DID THE REACTION INVOLVE: Swelling of the face/tongue/throat, SOB, or low BP? Unknown Sudden or severe rash/hives, skin peeling, or the inside of the mouth or nose? Unknown Did it require medical treatment? Unknown When did it last happen? Told all her life not to take If all above answers are "NO", may proceed with cephalosporin use.     Past Medical History:  Diagnosis Date   Cancer ([place])    Large B cell Lymphoma   Complication of anesthesia    Difficult to arouse   Hiatal hernia    HTN (hypertension)    Hx of colonic polyps    Hyperlipidemia    Hypothyroid    Pneumonia    Healthcare-associated versus community-acquired pneumonia   PONV (postoperative nausea and vomiting)      Past Surgical History:  Procedure Laterality Date   ABDOMINAL HYSTERECTOMY     CHOLECYSTECTOMY N/A 07/12/2018   Procedure: LAPAROSCOPIC CHOLECYSTECTOMY WITH INTRAOPERATIVE CHOLANGIOGRAM ERAS PATHWAY;  Surgeon:  Jovita Kussmaul, MD;  Location: [facility];  Service: General;  Laterality: N/A;   COLONOSCOPY  11/2017   S/P STEM CELL TRANSPLANT     Large B-Cell lymphoma    Family History  Problem Relation Age of Onset   Hypertension Other    Coronary artery disease Neg Hx     Social History   Tobacco Use   Smoking status: Never   Smokeless tobacco: Never  Vaping Use   Vaping Use: Never used  Substance Use Topics   Alcohol use: Not Currently    Alcohol/week: 1.0 standard drink of alcohol    Types: 1 Glasses of wine per  week    Comment: none since november   Drug use: No    ROS   Objective:   Vitals: BP (!) 184/99 (BP Location: Right Arm)   Pulse 70   Temp 98 F (36.7 C) (Oral)   Resp 16   SpO2 97%   Physical Exam Constitutional:      General: She is not in acute distress.    Appearance: Normal appearance. She is well-developed. She is not ill-appearing, toxic-appearing or diaphoretic.  HENT:     Head: Normocephalic and atraumatic.     Nose: Nose normal.     Mouth/Throat:     Mouth: Mucous membranes are moist.  Eyes:     General: No scleral icterus.       Right eye: No discharge.        Left eye: No discharge.     Extraocular Movements: Extraocular movements intact.  Cardiovascular:     Rate and Rhythm: Normal rate.  Pulmonary:     Effort: Pulmonary effort is normal.  Skin:    General: Skin is warm and dry.     Findings: Rash (annular lesion with central clearing of the lateral aspect of the left lower knee) present.  Neurological:     General: No focal deficit present.     Mental Status: She is alert and oriented to person, place, and time.     Cranial Nerves: No cranial nerve deficit.     Motor: No weakness.     Coordination: Coordination normal.     Gait: Gait normal.     Comments: Negative Romberg and pronator drift, no facial asymmetry.  Psychiatric:        Mood and Affect: Mood normal.        Behavior: Behavior normal.     Assessment and Plan :   PDMP not reviewed this encounter.  1. Tinea corporis   2. Essential hypertension     Will manage for tinea corporis with ketoconazole cream.  Emphasized need to maintain her current blood pressure medications.  Follow-up with her new PCP.  No signs of an acute encephalopathy in clinic.  Maintain strict ER precautions, reviewed stroke signs. Counseled patient on potential for adverse effects with medications prescribed/recommended today, ER and return-to-clinic precautions discussed, patient verbalized understanding.     Jaynee Eagles, [place] 07/08/22 [id number]

## 2022-07-08 NOTE — ED Triage Notes (Signed)
Pt c/o rash to left knee area x 2 weeks-NAD-steady gait

## 2022-07-14 ENCOUNTER — Other Ambulatory Visit: Payer: Self-pay | Admitting: Internal Medicine

## 2022-07-14 DIAGNOSIS — I10 Essential (primary) hypertension: Secondary | ICD-10-CM

## 2022-07-25 DIAGNOSIS — I10 Essential (primary) hypertension: Secondary | ICD-10-CM | POA: Diagnosis not present

## 2022-07-25 DIAGNOSIS — K8020 Calculus of gallbladder without cholecystitis without obstruction: Secondary | ICD-10-CM | POA: Diagnosis not present

## 2022-07-25 DIAGNOSIS — N179 Acute kidney failure, unspecified: Secondary | ICD-10-CM | POA: Diagnosis not present

## 2022-07-25 DIAGNOSIS — Z9481 Bone marrow transplant status: Secondary | ICD-10-CM | POA: Diagnosis not present

## 2022-07-25 DIAGNOSIS — Z08 Encounter for follow-up examination after completed treatment for malignant neoplasm: Secondary | ICD-10-CM | POA: Diagnosis not present

## 2022-07-25 DIAGNOSIS — Z8572 Personal history of non-Hodgkin lymphomas: Secondary | ICD-10-CM | POA: Diagnosis not present

## 2022-07-25 DIAGNOSIS — B029 Zoster without complications: Secondary | ICD-10-CM | POA: Diagnosis not present

## 2022-08-05 ENCOUNTER — Other Ambulatory Visit: Payer: Self-pay | Admitting: Internal Medicine

## 2022-08-05 DIAGNOSIS — I10 Essential (primary) hypertension: Secondary | ICD-10-CM

## 2022-08-09 ENCOUNTER — Other Ambulatory Visit: Payer: Self-pay | Admitting: Internal Medicine

## 2022-08-09 DIAGNOSIS — I10 Essential (primary) hypertension: Secondary | ICD-10-CM

## 2022-08-15 ENCOUNTER — Telehealth: Payer: Self-pay | Admitting: Internal Medicine

## 2022-08-15 ENCOUNTER — Encounter: Payer: Self-pay | Admitting: Internal Medicine

## 2022-08-15 ENCOUNTER — Ambulatory Visit (INDEPENDENT_AMBULATORY_CARE_PROVIDER_SITE_OTHER): Payer: Medicare Other | Admitting: Internal Medicine

## 2022-08-15 VITALS — BP 138/86 | HR 77 | Temp 98.0°F | Resp 16 | Ht 67.0 in | Wt 160.0 lb

## 2022-08-15 DIAGNOSIS — Z0000 Encounter for general adult medical examination without abnormal findings: Secondary | ICD-10-CM

## 2022-08-15 DIAGNOSIS — E039 Hypothyroidism, unspecified: Secondary | ICD-10-CM

## 2022-08-15 DIAGNOSIS — E785 Hyperlipidemia, unspecified: Secondary | ICD-10-CM

## 2022-08-15 DIAGNOSIS — Z23 Encounter for immunization: Secondary | ICD-10-CM | POA: Diagnosis not present

## 2022-08-15 DIAGNOSIS — I10 Essential (primary) hypertension: Secondary | ICD-10-CM

## 2022-08-15 DIAGNOSIS — N1831 Chronic kidney disease, stage 3a: Secondary | ICD-10-CM | POA: Diagnosis not present

## 2022-08-15 DIAGNOSIS — Z0001 Encounter for general adult medical examination with abnormal findings: Secondary | ICD-10-CM | POA: Insufficient documentation

## 2022-08-15 DIAGNOSIS — E876 Hypokalemia: Secondary | ICD-10-CM | POA: Insufficient documentation

## 2022-08-15 LAB — URINALYSIS, ROUTINE W REFLEX MICROSCOPIC
Bilirubin Urine: NEGATIVE
Hgb urine dipstick: NEGATIVE
Ketones, ur: NEGATIVE
Nitrite: NEGATIVE
Specific Gravity, Urine: 1.025 (ref 1.000–1.030)
Urine Glucose: NEGATIVE
Urobilinogen, UA: 0.2 (ref 0.0–1.0)
pH: 5.5 (ref 5.0–8.0)

## 2022-08-15 LAB — LIPID PANEL
Cholesterol: 167 mg/dL (ref 0–200)
HDL: 71.5 mg/dL (ref >39.00)
LDL Cholesterol: 76 mg/dL (ref 0–99)
NonHDL: 95.46
Total CHOL/HDL Ratio: 2
Triglycerides: 98 mg/dL (ref 0.0–149.0)
VLDL: 19.6 mg/dL (ref 0.0–40.0)

## 2022-08-15 LAB — TSH: TSH: 6.47 u[IU]/mL — ABNORMAL HIGH (ref 0.35–5.50)

## 2022-08-15 LAB — BASIC METABOLIC PANEL
BUN: 21 mg/dL (ref 6–23)
CO2: 30 mEq/L (ref 19–32)
Calcium: 9.8 mg/dL (ref 8.4–10.5)
Chloride: 102 mEq/L (ref 96–112)
Creatinine, Ser: 0.96 mg/dL (ref 0.40–1.20)
GFR: 56.7 mL/min — ABNORMAL LOW (ref >60.00)
Glucose, Bld: 97 mg/dL (ref 70–99)
Potassium: 3.8 mEq/L (ref 3.5–5.1)
Sodium: 143 mEq/L (ref 135–145)

## 2022-08-15 LAB — MAGNESIUM: Magnesium: 1.9 mg/dL (ref 1.5–2.5)

## 2022-08-15 MED ORDER — SHINGRIX 50 MCG/0.5ML IM SUSR: 0.5000 mL | Freq: Once | INTRAMUSCULAR | 1 refills | Status: AC

## 2022-08-15 MED ORDER — OLMESARTAN MEDOXOMIL 20 MG PO TABS: 20.0000 mg | ORAL_TABLET | Freq: Every day | ORAL | 1 refills | Status: AC

## 2022-08-15 NOTE — Telephone Encounter (Signed)
PT visits today post visit and wanted to make sure that it was noted that they needed a refill on their olmesartan (BENICAR) 20 MG tablet . PT would like this sent to [facility], [address] AT [address] if possible.  CB: [phone]

## 2022-08-15 NOTE — Progress Notes (Unsigned)
Subjective:  Patient ID: Jamie Villa, female    DOB: 1946/01/01  Age: 77 y.o. MRN: [id number]  CC: Annual Exam, Hypertension, Hyperlipidemia, and Hypothyroidism   HPI Doristine Mango Gonzalo presents for a CPX and f/up ----   Outpatient Medications Prior to Visit  Medication Sig Dispense Refill   furosemide (LASIX) 20 MG tablet Take 1 tablet (20 mg total) by mouth daily. Annual appt due in April must see provider for future refills 90 tablet 0   Multiple Vitamins-Minerals (VITAMIN D3 COMPLETE PO) Take by mouth.     rosuvastatin (CRESTOR) 10 MG tablet TAKE 1 TABLET(10 MG) BY MOUTH DAILY 90 tablet 1   olmesartan (BENICAR) 20 MG tablet TAKE 1 TABLET(20 MG) BY MOUTH DAILY 90 tablet 1   Cholecalciferol 50 MCG (2000 UT) TABS Take 1 tablet (2,000 Units total) by mouth daily. (Patient not taking: Reported on 08/15/2022) 90 tablet 1   UBRELVY 50 MG TABS TAKE 1 TABLET BY MOUTH DAILY AS NEEDED (Patient not taking: Reported on 08/15/2022) 10 tablet 2   baclofen (LIORESAL) 10 MG tablet Take 1 tablet (10 mg total) by mouth 3 (three) times daily. As needed for muscle spasm (Patient not taking: Reported on 08/15/2022) 15 tablet 0   ketoconazole (NIZORAL) 2 % cream Apply 1 Application topically daily. (Patient not taking: Reported on 08/15/2022) 60 g 0   meclizine (ANTIVERT) 25 MG tablet Take 25 mg by mouth 3 (three) times daily as needed for dizziness. (Patient not taking: Reported on 08/15/2022)     ondansetron (ZOFRAN) 4 MG tablet Take 1 tablet (4 mg total) by mouth every 8 (eight) hours as needed for nausea or vomiting. (Patient not taking: Reported on 08/15/2022) 12 tablet 0   ondansetron (ZOFRAN-ODT) 4 MG disintegrating tablet Take 1 tablet (4 mg total) by mouth every 8 (eight) hours as needed for nausea or vomiting. (Patient not taking: Reported on 08/15/2022) 10 tablet 0   oxyCODONE (ROXICODONE) 5 MG immediate release tablet Take 1-2 tablets (5-10 mg total) by mouth every 4 (four) hours as needed for  severe pain. (Patient not taking: Reported on 08/15/2022) 30 tablet 0   No facility-administered medications prior to visit.    ROS Review of Systems  Objective:  BP 138/86 (BP Location: Right Arm, Patient Position: Sitting, Cuff Size: Normal)   Pulse 77   Temp 98 F (36.7 C) (Oral)   Resp 16   Ht '5\' 7"'$  (1.702 m)   Wt 160 lb (72.6 kg)   SpO2 96%   BMI 25.06 kg/m   BP Readings from Last 3 Encounters:  08/15/22 138/86  07/08/22 (!) 184/99  06/09/22 (!) 167/94    Wt Readings from Last 3 Encounters:  08/15/22 160 lb (72.6 kg)  06/04/22 150 lb (68 kg)  04/12/22 150 lb (68 kg)    Physical Exam  Lab Results  Component Value Date   WBC 5.9 06/09/2022   HGB 14.6 06/09/2022   HCT 43.9 06/09/2022   PLT 172 06/09/2022   GLUCOSE 97 08/15/2022   CHOL 167 08/15/2022   TRIG 98.0 08/15/2022   HDL 71.50 08/15/2022   LDLDIRECT 60.0 12/13/2017   LDLCALC 76 08/15/2022   ALT 28 06/09/2022   AST 32 06/09/2022   NA 143 08/15/2022   K 3.8 08/15/2022   CL 102 08/15/2022   CREATININE 0.96 08/15/2022   BUN 21 08/15/2022   CO2 30 08/15/2022   TSH 6.47 (H) 08/15/2022   INR 0.90 10/14/2009   HGBA1C 5.7 10/22/2019  No results found.  Assessment & Plan:   Prakriti was seen today for annual exam, hypertension, hyperlipidemia and hypothyroidism.  Diagnoses and all orders for this visit:  Essential hypertension -     Basic metabolic panel; Future -     Urinalysis, Routine w reflex microscopic; Future -     Urinalysis, Routine w reflex microscopic -     Basic metabolic panel -     olmesartan (BENICAR) 20 MG tablet; Take 1 tablet (20 mg total) by mouth daily.  Stage 3a chronic kidney disease (HCC) -     Basic metabolic panel; Future -     Urinalysis, Routine w reflex microscopic; Future -     Urinalysis, Routine w reflex microscopic -     Basic metabolic panel  Dyslipidemia, goal LDL below 100 -     Lipid panel; Future -     TSH; Future -     TSH -     Lipid  panel  Hypothyroidism, unspecified type -     TSH; Future -     TSH  Encounter for general adult medical examination with abnormal findings  Need for prophylactic vaccination and inoculation against varicella -     Zoster Vaccine Adjuvanted [facility]) injection; Inject 0.5 mLs into the muscle once for 1 dose.  Hypokalemia -     Magnesium; Future -     Magnesium  Need for vaccination -     Pneumococcal polysaccharide vaccine 23-valent greater than or equal to 2yo subcutaneous/IM   I have discontinued Jomarie Longs. Ess's meclizine, oxyCODONE, baclofen, ondansetron, ondansetron, and ketoconazole. I have also changed her olmesartan. Additionally, I am having her start on Shingrix. Lastly, I am having her maintain her Cholecalciferol, Ubrelvy, rosuvastatin, furosemide, and Multiple Vitamins-Minerals (VITAMIN D3 COMPLETE PO).  Meds ordered this encounter  Medications   Zoster Vaccine Adjuvanted [facility]) injection    Sig: Inject 0.5 mLs into the muscle once for 1 dose.    Dispense:  0.5 mL    Refill:  1   olmesartan (BENICAR) 20 MG tablet    Sig: Take 1 tablet (20 mg total) by mouth daily.    Dispense:  90 tablet    Refill:  1     Follow-up: Return in about 6 months (around 02/13/2023).  Scarlette Calico, MD

## 2022-08-15 NOTE — Patient Instructions (Signed)

## 2022-08-18 DIAGNOSIS — Z0001 Encounter for general adult medical examination with abnormal findings: Secondary | ICD-10-CM | POA: Insufficient documentation

## 2022-08-18 DIAGNOSIS — Z23 Encounter for immunization: Secondary | ICD-10-CM | POA: Insufficient documentation

## 2022-09-09 DIAGNOSIS — E065 Other chronic thyroiditis: Secondary | ICD-10-CM | POA: Diagnosis not present

## 2022-09-09 DIAGNOSIS — Z8572 Personal history of non-Hodgkin lymphomas: Secondary | ICD-10-CM | POA: Diagnosis not present

## 2022-09-09 DIAGNOSIS — Z08 Encounter for follow-up examination after completed treatment for malignant neoplasm: Secondary | ICD-10-CM | POA: Diagnosis not present

## 2022-09-09 DIAGNOSIS — Z9481 Bone marrow transplant status: Secondary | ICD-10-CM | POA: Diagnosis not present

## 2022-10-19 NOTE — Telephone Encounter (Signed)
error 

## 2022-11-07 ENCOUNTER — Other Ambulatory Visit: Payer: Self-pay | Admitting: Internal Medicine

## 2022-11-07 DIAGNOSIS — E785 Hyperlipidemia, unspecified: Secondary | ICD-10-CM

## 2023-02-01 ENCOUNTER — Other Ambulatory Visit: Payer: Self-pay | Admitting: Internal Medicine

## 2023-02-01 DIAGNOSIS — I10 Essential (primary) hypertension: Secondary | ICD-10-CM

## 2023-02-23 ENCOUNTER — Ambulatory Visit (INDEPENDENT_AMBULATORY_CARE_PROVIDER_SITE_OTHER): Payer: Self-pay

## 2023-02-23 ENCOUNTER — Telehealth: Payer: Self-pay

## 2023-02-23 VITALS — Ht 67.0 in | Wt 150.0 lb

## 2023-02-23 DIAGNOSIS — Z0000 Encounter for general adult medical examination without abnormal findings: Secondary | ICD-10-CM

## 2023-02-23 NOTE — Progress Notes (Signed)
Subjective:   Jamie Villa is a 77 y.o. female who presents for Medicare Annual (Subsequent) preventive examination.  Visit Complete: Virtual  I connected with  Reola Calkins on 02/23/23 by a audio enabled telemedicine application and verified that I am speaking with the correct person using two identifiers.  Patient Location: Home  Provider Location: Office/Clinic  I discussed the limitations of evaluation and management by telemedicine. The patient expressed understanding and agreed to proceed.  Vital Signs: Because this visit was a virtual/telehealth visit, some criteria may be missing or patient reported. Any vitals not documented were not able to be obtained and vitals that have been documented are patient reported.    Review of Systems     Cardiac Risk Factors include: advanced age (>56men, >41 women);dyslipidemia;family history of premature cardiovascular disease;hypertension     Objective:    Today's Vitals   02/23/23 1304  Weight: 150 lb (68 kg)  Height: 5\' 7"  (1.702 m)  PainSc: 0-No pain   Body mass index is 23.49 kg/m.     02/23/2023    1:09 PM 06/09/2022    5:11 PM 04/12/2022    1:36 PM 11/01/2021    6:10 PM 10/06/2020   12:39 PM 06/30/2020    2:35 PM 12/27/2019    2:47 PM  Advanced Directives  Does Patient Have a Medical Advance Directive? Yes No Yes Yes Yes Yes Yes  Type of Estate agent of [place];Living will  Healthcare Power of [place];Living will Healthcare Power of [place];Living will  Healthcare Power of [employer] of [place];Living will  Does patient want to make changes to medical advance directive?    No - Patient declined No - Patient declined    Copy of Healthcare Power of Attorney in Chart? No - copy requested  No - copy requested No - copy requested   No - copy requested    Current Medications (verified) Outpatient Encounter Medications as of 02/23/2023  Medication Sig   Cholecalciferol 50 MCG  (2000 UT) TABS Take 1 tablet (2,000 Units total) by mouth daily.   furosemide (LASIX) 20 MG tablet Take 1 tablet (20 mg total) by mouth daily. Annual appt due in April must see provider for future refills   Multiple Vitamins-Minerals (VITAMIN D3 COMPLETE PO) Take by mouth.   olmesartan (BENICAR) 20 MG tablet Take 1 tablet (20 mg total) by mouth daily.   rosuvastatin (CRESTOR) 10 MG tablet TAKE 1 TABLET(10 MG) BY MOUTH DAILY   UBRELVY 50 MG TABS TAKE 1 TABLET BY MOUTH DAILY AS NEEDED   No facility-administered encounter medications on file as of 02/23/2023.    Allergies (verified) Codeine and Penicillins   History: Past Medical History:  Diagnosis Date   Cancer (HCC)    Large B cell Lymphoma   Complication of anesthesia    Difficult to arouse   Hiatal hernia    HTN (hypertension)    Hx of colonic polyps    Hyperlipidemia    Hypothyroid    Pneumonia    Healthcare-associated versus community-acquired pneumonia   PONV (postoperative nausea and vomiting)    Past Surgical History:  Procedure Laterality Date   ABDOMINAL HYSTERECTOMY     CHOLECYSTECTOMY N/A 07/12/2018   Procedure: LAPAROSCOPIC CHOLECYSTECTOMY WITH INTRAOPERATIVE CHOLANGIOGRAM ERAS PATHWAY;  Surgeon: Griselda Miner, MD;  Location: [facility];  Service: General;  Laterality: N/A;   COLONOSCOPY  11/2017   S/P STEM CELL TRANSPLANT     Large B-Cell lymphoma   Family  History  Problem Relation Age of Onset   Hypertension Other    Coronary artery disease Neg Hx    Social History   Socioeconomic History   Marital status: Married    Spouse name: Not on file   Number of children: 2   Years of education: Not on file   Highest education level: Not on file  Occupational History   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   Social Determinants of Health   Financial Resource Strain: Low Risk  (02/23/2023)   Overall Financial Resource Strain (CARDIA)    Difficulty of Paying Living Expenses: Not hard at all  Food Insecurity: No Food Insecurity (02/23/2023)   Hunger Vital Sign    Worried About Running Out of Food in the Last Year: Never true    Ran Out of Food in the Last Year: Never true  Transportation Needs: No Transportation Needs (02/23/2023)   PRAPARE - Administrator, Civil Service (Medical): No    Lack of Transportation (Non-Medical): No  Physical Activity: Sufficiently Active (02/23/2023)   Exercise Vital Sign    Days of Exercise per Week: 7 days    Minutes of Exercise per Session: 30 min  Stress: No Stress Concern Present (02/23/2023)   [employer] of Occupational Health - Occupational Stress Questionnaire    Feeling of Stress : Not at all  Social Connections: Socially Integrated (02/23/2023)   Social Connection and Isolation Panel [NHANES]    Frequency of Communication with Friends and Family: More than three times a week    Frequency of Social Gatherings with Friends and Family: More than three times a week    Attends Religious Services: More than 4 times per year    Active Member of [employer] or Organizations: Yes    Attends Engineer, structural: More than 4 times per year    Marital Status: Married    Tobacco Counseling Counseling given: Not Answered   Clinical Intake:  Pre-visit preparation completed: Yes  Pain : No/denies pain Pain Score: 0-No pain     BMI - recorded: 23.49 Nutritional Status: BMI of 19-24  Normal Nutritional Risks: None Diabetes: No  How often do you need to have someone help you when  you read instructions, pamphlets, or other written materials from your doctor or pharmacy?: 1 - Never What is the last grade level you completed in school?: HSG  Interpreter Needed?: No  Information entered by :: Letty Salvi N. Liz Pinho, LPN.   Activities of Daily Living    02/23/2023    1:11 PM 04/12/2022    1:47 PM  In your present state of health, do you have any difficulty performing the following activities:  Hearing? 1 0  Vision? 0 0  Difficulty concentrating or making decisions? 0 0  Walking or climbing stairs? 0 0  Dressing or bathing? 0 0  Doing errands, shopping? 0 0  Preparing Food and eating ? N N  Using the Toilet? N N  In the past six months, have you accidently leaked urine? N N  Do you have problems with loss of bowel control? N N  Managing your Medications? N N  Managing your Finances? N N  Housekeeping or managing your Housekeeping? N N    Patient Care Team: Etta Grandchild, MD as PCP - Lazarus Salines, MD as Referring Physician (Internal Medicine) Genia Del Daisy Blossom, MD as Consulting Physician (Ophthalmology)  Indicate any recent Medical Services you may have received from other than [facility] providers in the past year (date may be approximate).     Assessment:   This is a routine wellness examination for [place].  Hearing/Vision screen Hearing Screening - Comments:: Patient wears hearing aids. Vision Screening - Comments:: Wears rx glasses - up to date with routine eye exams with Mack Hook, MD.    Goals Addressed   None   Depression Screen    02/23/2023    1:11 PM 08/15/2022    8:08 AM 04/12/2022    1:40 PM 10/06/2020   12:39 PM 05/03/2019    2:51 PM 03/18/2019    4:09 PM 03/02/2018    3:40 PM  PHQ 2/9 Scores  PHQ - 2 Score 0 0 0 0 0 1 1  PHQ- 9 Score 0     3 2    Fall Risk    02/23/2023    1:10 PM 08/15/2022    8:08 AM 04/12/2022    1:37 PM 10/06/2020   12:39 PM 12/27/2019    2:49 PM  Fall Risk   Falls in the past year? 0 0 0 0 1  Number  falls in past yr: 0 0 0 0 0  Injury with Fall? 0 0 0 0 0  Risk for fall due to : No Fall Risks No Fall Risks No Fall Risks No Fall Risks   Follow up Falls prevention discussed Falls evaluation completed Falls prevention discussed Falls evaluation completed     MEDICARE RISK AT HOME: Medicare Risk at Home Any stairs in or around the home?: Yes If so, are there any without handrails?: No Home free of loose throw rugs in walkways, pet beds, electrical cords, etc?: Yes Adequate lighting in your home to reduce risk of falls?: Yes Life alert?: No Use of a cane, walker or w/c?: No Grab bars in the bathroom?: Yes Shower chair or bench in shower?: Yes Elevated toilet seat or a handicapped toilet?: Yes  TIMED UP AND GO:  Was the test performed?  No    Cognitive Function:    02/22/2017    2:21 PM  MMSE - Mini Mental State Exam  Orientation to time 5  Orientation to Place 5  Registration 3  Attention/ Calculation 5  Recall 3  Language- name 2 objects 2  Language- repeat 1  Language- follow 3 step command 3  Language- read & follow direction 1  Write a sentence 1  Copy design 1  Total score 30        02/23/2023    1:12 PM 04/12/2022    1:47 PM  6CIT Screen  What Year? 0 points 0 points  What month? 0 points 0 points  What time? 0 points 0 points  Count back from 20 0 points 0 points  Months in reverse 0 points 0 points  Repeat phrase 0 points 0 points  Total Score 0 points 0 points    Immunizations Immunization History  Administered Date(s) Administered  Influenza Split 05/16/2011, 04/03/2012   PFIZER(Purple Top)SARS-COV-2 Vaccination 11/07/2019, 12/02/2019   Pneumococcal Conjugate-13 04/29/2010   Pneumococcal Polysaccharide-23 05/08/2014, 08/15/2022   Td 06/20/2004, 12/05/2013   Zoster, Live 06/04/2013    TDAP status: Up to date  Flu Vaccine status: Declined, Education has been provided regarding the importance of this vaccine but patient still declined. Advised  may receive this vaccine at local pharmacy or Health Dept. Aware to provide a copy of the vaccination record if obtained from local pharmacy or Health Dept. Verbalized acceptance and understanding.  Pneumococcal vaccine status: Up to date  Covid-19 vaccine status: Completed vaccines  Qualifies for Shingles Vaccine? Yes   Zostavax completed Yes   Shingrix Completed?: No.    Education has been provided regarding the importance of this vaccine. Patient has been advised to call insurance company to determine out of pocket expense if they have not yet received this vaccine. Advised may also receive vaccine at local pharmacy or Health Dept. Verbalized acceptance and understanding.  Screening Tests Health Maintenance  Topic Date Due   Zoster Vaccines- Shingrix (1 of 2) 03/12/1965   DTaP/Tdap/Td (3 - Tdap) 12/06/2023   Pneumonia Vaccine 40+ Years old  Completed   DEXA SCAN  Completed   Hepatitis C Screening  Completed   HPV VACCINES  Aged Out   INFLUENZA VACCINE  Discontinued   Colonoscopy  Discontinued   COVID-19 Vaccine  Discontinued    Health Maintenance  Health Maintenance Due  Topic Date Due   Zoster Vaccines- Shingrix (1 of 2) 03/12/1965    Colorectal cancer screening: No longer required.   Mammogram status: Completed 11/10/2020. Repeat every year  Bone Density status: Completed 05/06/2011. Results reflect: Bone density results: NORMAL. Repeat every 5 years.  Lung Cancer Screening: (Low Dose CT Chest recommended if Age 86-80 years, 20 pack-year currently smoking OR have quit w/in 15years.) does not qualify.   Lung Cancer Screening Referral: no  Additional Screening:  Hepatitis C Screening: does qualify; Completed 05/20/2015  Vision Screening: Recommended annual ophthalmology exams for early detection of glaucoma and other disorders of the eye. Is the patient up to date with their annual eye exam?  Yes  Who is the provider or what is the name of the office in which the  patient attends annual eye exams? Mack Hook, MD. If pt is not established with a provider, would they like to be referred to a provider to establish care? No .   Dental Screening: Recommended annual dental exams for proper oral hygiene  Diabetic Foot Exam: N/A  Community Resource Referral / Chronic Care Management: CRR required this visit?  No   CCM required this visit?  No     Plan:     I have personally reviewed and noted the following in the patient's chart:   Medical and social history Use of alcohol, tobacco or illicit drugs  Current medications and supplements including opioid prescriptions. Patient is not currently taking opioid prescriptions. Functional ability and status Nutritional status Physical activity Advanced directives List of other physicians Hospitalizations, surgeries, and ER visits in previous 12 months Vitals Screenings to include cognitive, depression, and falls Referrals and appointments  In addition, I have reviewed and discussed with patient certain preventive protocols, quality metrics, and best practice recommendations. A written personalized care plan for preventive services as well as general preventive health recommendations were provided to patient.     Mickeal Needy, LPN   10/25/8467   After Visit Summary: (Mail) Due to this being  a telephonic visit, the after visit summary with patients personalized plan was offered to patient via mail   Nurse Notes: Normal cognitive status assessed by direct observation via telephone conversation by this Nurse Health Advisor. No abnormalities found.

## 2023-02-23 NOTE — Telephone Encounter (Signed)
AWV was completed today.  Patient is requesting a refill on Rousvastatin 10 mg to be sent to her new location until she can find a new provider.  This nurse advised patient that I would need to get authorization from Dr. Yetta Barre first.  Patient is currently using [facility] [address] [place], [place] [address] [phone]. Please return call to patient at [phone].

## 2023-02-23 NOTE — Patient Instructions (Signed)
Ms. Guider , Thank you for taking time to come for your Medicare Wellness Visit. I appreciate your ongoing commitment to your health goals. Please review the following plan we discussed and let me know if I can assist you in the future.   Referrals/Orders/Follow-Ups/Clinician Recommendations: No  This is a list of the screening recommended for you and due dates:  Health Maintenance  Topic Date Due   Zoster (Shingles) Vaccine (1 of 2) 03/12/1965   DTaP/Tdap/Td vaccine (3 - Tdap) 12/06/2023   Pneumonia Vaccine  Completed   DEXA scan (bone density measurement)  Completed   Hepatitis C Screening  Completed   HPV Vaccine  Aged Out   Flu Shot  Discontinued   Colon Cancer Screening  Discontinued   COVID-19 Vaccine  Discontinued    Advanced directives: (Copy Requested) Please bring a copy of your health care power of attorney and living will to the office to be added to your chart at your convenience.  Next Medicare Annual Wellness Visit scheduled for next year: No; patient has relocated.
